# Patient Record
Sex: Female | Born: 1980 | Race: Black or African American | Hispanic: No | Marital: Single | Smoking: Never smoker
Health system: Southern US, Community
[De-identification: ages and names within clinical notes are randomized; demographics above are authoritative.]

## PROBLEM LIST (undated history)

## (undated) ENCOUNTER — Inpatient Hospital Stay (HOSPITAL_COMMUNITY): Payer: Self-pay

## (undated) DIAGNOSIS — K0889 Other specified disorders of teeth and supporting structures: Secondary | ICD-10-CM

## (undated) DIAGNOSIS — F329 Major depressive disorder, single episode, unspecified: Secondary | ICD-10-CM

## (undated) DIAGNOSIS — F32A Depression, unspecified: Secondary | ICD-10-CM

## (undated) HISTORY — PX: WISDOM TOOTH EXTRACTION: SHX21

---
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
## 2002-02-21 ENCOUNTER — Encounter: Payer: Self-pay | Admitting: Emergency Medicine

## 2002-02-21 ENCOUNTER — Emergency Department (HOSPITAL_COMMUNITY): Admission: EM | Admit: 2002-02-21 | Discharge: 2002-02-21 | Payer: Self-pay | Admitting: Emergency Medicine

## 2002-04-18 ENCOUNTER — Inpatient Hospital Stay (HOSPITAL_COMMUNITY): Admission: AD | Admit: 2002-04-18 | Discharge: 2002-04-18 | Payer: Self-pay | Admitting: *Deleted

## 2002-04-18 ENCOUNTER — Encounter: Payer: Self-pay | Admitting: Obstetrics and Gynecology

## 2002-04-20 ENCOUNTER — Encounter: Payer: Self-pay | Admitting: Obstetrics and Gynecology

## 2002-04-20 ENCOUNTER — Inpatient Hospital Stay (HOSPITAL_COMMUNITY): Admission: AD | Admit: 2002-04-20 | Discharge: 2002-04-20 | Payer: Self-pay | Admitting: Obstetrics and Gynecology

## 2002-05-22 ENCOUNTER — Ambulatory Visit (HOSPITAL_COMMUNITY): Admission: RE | Admit: 2002-05-22 | Discharge: 2002-05-22 | Payer: Self-pay | Admitting: Obstetrics and Gynecology

## 2002-07-13 ENCOUNTER — Inpatient Hospital Stay (HOSPITAL_COMMUNITY): Admission: AD | Admit: 2002-07-13 | Discharge: 2002-07-13 | Payer: Self-pay | Admitting: *Deleted

## 2002-08-21 ENCOUNTER — Inpatient Hospital Stay (HOSPITAL_COMMUNITY): Admission: AD | Admit: 2002-08-21 | Discharge: 2002-08-21 | Payer: Self-pay | Admitting: *Deleted

## 2002-08-23 ENCOUNTER — Inpatient Hospital Stay (HOSPITAL_COMMUNITY): Admission: AD | Admit: 2002-08-23 | Discharge: 2002-08-23 | Payer: Self-pay | Admitting: Obstetrics and Gynecology

## 2002-09-20 ENCOUNTER — Encounter: Payer: Self-pay | Admitting: Obstetrics and Gynecology

## 2002-09-20 ENCOUNTER — Inpatient Hospital Stay (HOSPITAL_COMMUNITY): Admission: AD | Admit: 2002-09-20 | Discharge: 2002-09-20 | Payer: Self-pay | Admitting: Obstetrics and Gynecology

## 2002-09-28 ENCOUNTER — Inpatient Hospital Stay (HOSPITAL_COMMUNITY): Admission: AD | Admit: 2002-09-28 | Discharge: 2002-09-28 | Payer: Self-pay | Admitting: *Deleted

## 2002-10-09 ENCOUNTER — Inpatient Hospital Stay (HOSPITAL_COMMUNITY): Admission: AD | Admit: 2002-10-09 | Discharge: 2002-10-09 | Payer: Self-pay | Admitting: *Deleted

## 2002-10-13 ENCOUNTER — Inpatient Hospital Stay (HOSPITAL_COMMUNITY): Admission: AD | Admit: 2002-10-13 | Discharge: 2002-10-15 | Payer: Self-pay | Admitting: Obstetrics and Gynecology

## 2003-11-12 ENCOUNTER — Emergency Department (HOSPITAL_COMMUNITY): Admission: EM | Admit: 2003-11-12 | Discharge: 2003-11-12 | Payer: Self-pay | Admitting: *Deleted

## 2004-04-15 ENCOUNTER — Emergency Department (HOSPITAL_COMMUNITY): Admission: EM | Admit: 2004-04-15 | Discharge: 2004-04-16 | Payer: Self-pay | Admitting: Emergency Medicine

## 2004-04-16 ENCOUNTER — Inpatient Hospital Stay (HOSPITAL_COMMUNITY): Admission: AD | Admit: 2004-04-16 | Discharge: 2004-04-16 | Payer: Self-pay | Admitting: Obstetrics and Gynecology

## 2004-04-19 ENCOUNTER — Inpatient Hospital Stay (HOSPITAL_COMMUNITY): Admission: AD | Admit: 2004-04-19 | Discharge: 2004-04-19 | Payer: Self-pay | Admitting: Obstetrics and Gynecology

## 2004-04-21 ENCOUNTER — Inpatient Hospital Stay (HOSPITAL_COMMUNITY): Admission: AD | Admit: 2004-04-21 | Discharge: 2004-04-21 | Payer: Self-pay | Admitting: Obstetrics and Gynecology

## 2004-05-05 ENCOUNTER — Inpatient Hospital Stay (HOSPITAL_COMMUNITY): Admission: AD | Admit: 2004-05-05 | Discharge: 2004-05-05 | Payer: Self-pay | Admitting: Obstetrics & Gynecology

## 2004-05-19 ENCOUNTER — Inpatient Hospital Stay (HOSPITAL_COMMUNITY): Admission: AD | Admit: 2004-05-19 | Discharge: 2004-05-19 | Payer: Self-pay | Admitting: Obstetrics and Gynecology

## 2004-06-01 ENCOUNTER — Inpatient Hospital Stay (HOSPITAL_COMMUNITY): Admission: AD | Admit: 2004-06-01 | Discharge: 2004-06-01 | Payer: Self-pay | Admitting: Obstetrics

## 2004-07-07 ENCOUNTER — Inpatient Hospital Stay (HOSPITAL_COMMUNITY): Admission: AD | Admit: 2004-07-07 | Discharge: 2004-07-07 | Payer: Self-pay | Admitting: Obstetrics and Gynecology

## 2004-08-06 ENCOUNTER — Ambulatory Visit (HOSPITAL_COMMUNITY): Admission: RE | Admit: 2004-08-06 | Discharge: 2004-08-06 | Payer: Self-pay | Admitting: Obstetrics & Gynecology

## 2004-08-29 ENCOUNTER — Ambulatory Visit (HOSPITAL_COMMUNITY): Admission: RE | Admit: 2004-08-29 | Discharge: 2004-08-29 | Payer: Self-pay | Admitting: Obstetrics & Gynecology

## 2004-09-03 ENCOUNTER — Inpatient Hospital Stay (HOSPITAL_COMMUNITY): Admission: AD | Admit: 2004-09-03 | Discharge: 2004-09-03 | Payer: Self-pay | Admitting: Obstetrics & Gynecology

## 2004-09-07 ENCOUNTER — Inpatient Hospital Stay (HOSPITAL_COMMUNITY): Admission: AD | Admit: 2004-09-07 | Discharge: 2004-09-07 | Payer: Self-pay | Admitting: Obstetrics

## 2004-09-11 ENCOUNTER — Emergency Department (HOSPITAL_COMMUNITY): Admission: EM | Admit: 2004-09-11 | Discharge: 2004-09-11 | Payer: Self-pay | Admitting: Family Medicine

## 2004-10-26 ENCOUNTER — Inpatient Hospital Stay (HOSPITAL_COMMUNITY): Admission: AD | Admit: 2004-10-26 | Discharge: 2004-10-26 | Payer: Self-pay | Admitting: Obstetrics & Gynecology

## 2004-11-07 ENCOUNTER — Inpatient Hospital Stay (HOSPITAL_COMMUNITY): Admission: AD | Admit: 2004-11-07 | Discharge: 2004-11-07 | Payer: Self-pay | Admitting: Obstetrics & Gynecology

## 2004-12-07 ENCOUNTER — Inpatient Hospital Stay (HOSPITAL_COMMUNITY): Admission: AD | Admit: 2004-12-07 | Discharge: 2004-12-07 | Payer: Self-pay | Admitting: Obstetrics

## 2004-12-09 ENCOUNTER — Inpatient Hospital Stay (HOSPITAL_COMMUNITY): Admission: AD | Admit: 2004-12-09 | Discharge: 2004-12-09 | Payer: Self-pay | Admitting: Obstetrics

## 2004-12-25 ENCOUNTER — Inpatient Hospital Stay (HOSPITAL_COMMUNITY): Admission: AD | Admit: 2004-12-25 | Discharge: 2004-12-28 | Payer: Self-pay | Admitting: Obstetrics

## 2005-01-22 ENCOUNTER — Emergency Department (HOSPITAL_COMMUNITY): Admission: EM | Admit: 2005-01-22 | Discharge: 2005-01-23 | Payer: Self-pay | Admitting: Emergency Medicine

## 2005-01-24 ENCOUNTER — Emergency Department (HOSPITAL_COMMUNITY): Admission: EM | Admit: 2005-01-24 | Discharge: 2005-01-24 | Payer: Self-pay | Admitting: Emergency Medicine

## 2005-01-25 ENCOUNTER — Emergency Department (HOSPITAL_COMMUNITY): Admission: EM | Admit: 2005-01-25 | Discharge: 2005-01-25 | Payer: Self-pay | Admitting: Emergency Medicine

## 2005-02-10 ENCOUNTER — Emergency Department (HOSPITAL_COMMUNITY): Admission: EM | Admit: 2005-02-10 | Discharge: 2005-02-10 | Payer: Self-pay | Admitting: Family Medicine

## 2005-10-11 ENCOUNTER — Emergency Department (HOSPITAL_COMMUNITY): Admission: EM | Admit: 2005-10-11 | Discharge: 2005-10-11 | Payer: Self-pay | Admitting: Emergency Medicine

## 2006-03-17 ENCOUNTER — Ambulatory Visit (HOSPITAL_COMMUNITY): Admission: RE | Admit: 2006-03-17 | Discharge: 2006-03-17 | Payer: Self-pay | Admitting: Obstetrics

## 2006-03-23 ENCOUNTER — Inpatient Hospital Stay (HOSPITAL_COMMUNITY): Admission: AD | Admit: 2006-03-23 | Discharge: 2006-03-24 | Payer: Self-pay | Admitting: Obstetrics

## 2006-03-30 ENCOUNTER — Inpatient Hospital Stay (HOSPITAL_COMMUNITY): Admission: AD | Admit: 2006-03-30 | Discharge: 2006-03-31 | Payer: Self-pay | Admitting: Obstetrics

## 2006-05-05 ENCOUNTER — Inpatient Hospital Stay (HOSPITAL_COMMUNITY): Admission: AD | Admit: 2006-05-05 | Discharge: 2006-05-07 | Payer: Self-pay | Admitting: Obstetrics & Gynecology

## 2006-06-04 ENCOUNTER — Emergency Department (HOSPITAL_COMMUNITY): Admission: EM | Admit: 2006-06-04 | Discharge: 2006-06-04 | Payer: Self-pay | Admitting: Emergency Medicine

## 2006-06-04 ENCOUNTER — Inpatient Hospital Stay (HOSPITAL_COMMUNITY): Admission: AD | Admit: 2006-06-04 | Discharge: 2006-06-05 | Payer: Self-pay | Admitting: Obstetrics

## 2006-07-24 ENCOUNTER — Inpatient Hospital Stay (HOSPITAL_COMMUNITY): Admission: AD | Admit: 2006-07-24 | Discharge: 2006-07-24 | Payer: Self-pay | Admitting: Obstetrics & Gynecology

## 2006-07-26 ENCOUNTER — Inpatient Hospital Stay (HOSPITAL_COMMUNITY): Admission: AD | Admit: 2006-07-26 | Discharge: 2006-07-27 | Payer: Self-pay | Admitting: Obstetrics

## 2006-08-07 ENCOUNTER — Inpatient Hospital Stay (HOSPITAL_COMMUNITY): Admission: AD | Admit: 2006-08-07 | Discharge: 2006-08-09 | Payer: Self-pay | Admitting: Obstetrics

## 2006-08-12 ENCOUNTER — Inpatient Hospital Stay (HOSPITAL_COMMUNITY): Admission: AD | Admit: 2006-08-12 | Discharge: 2006-08-13 | Payer: Self-pay | Admitting: Obstetrics & Gynecology

## 2006-09-04 ENCOUNTER — Emergency Department (HOSPITAL_COMMUNITY): Admission: EM | Admit: 2006-09-04 | Discharge: 2006-09-04 | Payer: Self-pay | Admitting: Emergency Medicine

## 2006-09-15 ENCOUNTER — Inpatient Hospital Stay (HOSPITAL_COMMUNITY): Admission: AD | Admit: 2006-09-15 | Discharge: 2006-09-16 | Payer: Self-pay | Admitting: Obstetrics

## 2007-03-27 ENCOUNTER — Emergency Department (HOSPITAL_COMMUNITY): Admission: EM | Admit: 2007-03-27 | Discharge: 2007-03-28 | Payer: Self-pay | Admitting: Emergency Medicine

## 2007-03-30 ENCOUNTER — Other Ambulatory Visit: Admission: RE | Admit: 2007-03-30 | Discharge: 2007-03-30 | Payer: Self-pay | Admitting: Obstetrics and Gynecology

## 2007-04-13 ENCOUNTER — Inpatient Hospital Stay (HOSPITAL_COMMUNITY): Admission: AD | Admit: 2007-04-13 | Discharge: 2007-04-14 | Payer: Self-pay | Admitting: Obstetrics & Gynecology

## 2007-05-10 ENCOUNTER — Inpatient Hospital Stay (HOSPITAL_COMMUNITY): Admission: AD | Admit: 2007-05-10 | Discharge: 2007-05-11 | Payer: Self-pay | Admitting: Obstetrics & Gynecology

## 2007-05-17 ENCOUNTER — Ambulatory Visit (HOSPITAL_COMMUNITY): Admission: RE | Admit: 2007-05-17 | Discharge: 2007-05-17 | Payer: Self-pay | Admitting: Obstetrics

## 2007-05-17 ENCOUNTER — Encounter: Payer: Self-pay | Admitting: Obstetrics

## 2007-05-17 HISTORY — PX: DILATION AND EVACUATION: SHX1459

## 2007-05-30 ENCOUNTER — Emergency Department (HOSPITAL_COMMUNITY): Admission: EM | Admit: 2007-05-30 | Discharge: 2007-05-30 | Payer: Self-pay | Admitting: Emergency Medicine

## 2007-08-31 ENCOUNTER — Emergency Department (HOSPITAL_COMMUNITY): Admission: EM | Admit: 2007-08-31 | Discharge: 2007-08-31 | Payer: Self-pay | Admitting: Emergency Medicine

## 2007-09-01 ENCOUNTER — Emergency Department (HOSPITAL_COMMUNITY): Admission: EM | Admit: 2007-09-01 | Discharge: 2007-09-01 | Payer: Self-pay | Admitting: General Surgery

## 2007-10-15 ENCOUNTER — Emergency Department (HOSPITAL_COMMUNITY): Admission: EM | Admit: 2007-10-15 | Discharge: 2007-10-16 | Payer: Self-pay | Admitting: Emergency Medicine

## 2007-11-28 ENCOUNTER — Emergency Department (HOSPITAL_COMMUNITY): Admission: EM | Admit: 2007-11-28 | Discharge: 2007-11-28 | Payer: Self-pay | Admitting: Emergency Medicine

## 2008-02-01 ENCOUNTER — Emergency Department (HOSPITAL_COMMUNITY): Admission: EM | Admit: 2008-02-01 | Discharge: 2008-02-02 | Payer: Self-pay | Admitting: Emergency Medicine

## 2008-05-09 ENCOUNTER — Emergency Department (HOSPITAL_COMMUNITY): Admission: EM | Admit: 2008-05-09 | Discharge: 2008-05-09 | Payer: Self-pay | Admitting: Emergency Medicine

## 2008-07-17 ENCOUNTER — Emergency Department (HOSPITAL_COMMUNITY): Admission: EM | Admit: 2008-07-17 | Discharge: 2008-07-18 | Payer: Self-pay | Admitting: Emergency Medicine

## 2008-08-01 ENCOUNTER — Emergency Department (HOSPITAL_COMMUNITY): Admission: EM | Admit: 2008-08-01 | Discharge: 2008-08-01 | Payer: Self-pay | Admitting: Emergency Medicine

## 2008-08-16 ENCOUNTER — Emergency Department (HOSPITAL_COMMUNITY): Admission: EM | Admit: 2008-08-16 | Discharge: 2008-08-16 | Payer: Self-pay | Admitting: Emergency Medicine

## 2008-08-20 ENCOUNTER — Inpatient Hospital Stay (HOSPITAL_COMMUNITY): Admission: AD | Admit: 2008-08-20 | Discharge: 2008-08-20 | Payer: Self-pay | Admitting: Obstetrics & Gynecology

## 2008-09-02 ENCOUNTER — Inpatient Hospital Stay (HOSPITAL_COMMUNITY): Admission: AD | Admit: 2008-09-02 | Discharge: 2008-09-02 | Payer: Self-pay | Admitting: Obstetrics & Gynecology

## 2008-09-07 ENCOUNTER — Inpatient Hospital Stay (HOSPITAL_COMMUNITY): Admission: AD | Admit: 2008-09-07 | Discharge: 2008-09-07 | Payer: Self-pay | Admitting: Obstetrics and Gynecology

## 2008-09-09 ENCOUNTER — Emergency Department (HOSPITAL_COMMUNITY): Admission: EM | Admit: 2008-09-09 | Discharge: 2008-09-09 | Payer: Self-pay | Admitting: Emergency Medicine

## 2008-09-12 ENCOUNTER — Emergency Department (HOSPITAL_COMMUNITY): Admission: EM | Admit: 2008-09-12 | Discharge: 2008-09-12 | Payer: Self-pay | Admitting: Emergency Medicine

## 2008-09-15 ENCOUNTER — Inpatient Hospital Stay (HOSPITAL_COMMUNITY): Admission: AD | Admit: 2008-09-15 | Discharge: 2008-09-15 | Payer: Self-pay | Admitting: Obstetrics & Gynecology

## 2008-09-22 ENCOUNTER — Emergency Department (HOSPITAL_COMMUNITY): Admission: EM | Admit: 2008-09-22 | Discharge: 2008-09-22 | Payer: Self-pay | Admitting: Family Medicine

## 2008-09-27 ENCOUNTER — Emergency Department (HOSPITAL_COMMUNITY): Admission: EM | Admit: 2008-09-27 | Discharge: 2008-09-27 | Payer: Self-pay | Admitting: Emergency Medicine

## 2008-09-30 ENCOUNTER — Inpatient Hospital Stay (HOSPITAL_COMMUNITY): Admission: AD | Admit: 2008-09-30 | Discharge: 2008-09-30 | Payer: Self-pay | Admitting: Obstetrics and Gynecology

## 2008-10-04 ENCOUNTER — Inpatient Hospital Stay (HOSPITAL_COMMUNITY): Admission: AD | Admit: 2008-10-04 | Discharge: 2008-10-04 | Payer: Self-pay | Admitting: Obstetrics and Gynecology

## 2008-10-13 ENCOUNTER — Inpatient Hospital Stay (HOSPITAL_COMMUNITY): Admission: AD | Admit: 2008-10-13 | Discharge: 2008-10-13 | Payer: Self-pay | Admitting: Obstetrics and Gynecology

## 2008-10-13 ENCOUNTER — Ambulatory Visit: Payer: Self-pay | Admitting: Obstetrics and Gynecology

## 2008-10-21 ENCOUNTER — Emergency Department (HOSPITAL_COMMUNITY): Admission: EM | Admit: 2008-10-21 | Discharge: 2008-10-21 | Payer: Self-pay | Admitting: Emergency Medicine

## 2008-10-22 ENCOUNTER — Ambulatory Visit (HOSPITAL_COMMUNITY): Admission: RE | Admit: 2008-10-22 | Discharge: 2008-10-22 | Payer: Self-pay | Admitting: Obstetrics and Gynecology

## 2008-10-27 ENCOUNTER — Ambulatory Visit: Payer: Self-pay | Admitting: Advanced Practice Midwife

## 2008-10-27 ENCOUNTER — Inpatient Hospital Stay (HOSPITAL_COMMUNITY): Admission: AD | Admit: 2008-10-27 | Discharge: 2008-10-28 | Payer: Self-pay | Admitting: Obstetrics and Gynecology

## 2008-11-02 ENCOUNTER — Inpatient Hospital Stay (HOSPITAL_COMMUNITY): Admission: AD | Admit: 2008-11-02 | Discharge: 2008-11-02 | Payer: Self-pay | Admitting: Obstetrics and Gynecology

## 2008-11-11 ENCOUNTER — Inpatient Hospital Stay (HOSPITAL_COMMUNITY): Admission: AD | Admit: 2008-11-11 | Discharge: 2008-11-12 | Payer: Self-pay | Admitting: Obstetrics and Gynecology

## 2008-11-15 ENCOUNTER — Ambulatory Visit (HOSPITAL_COMMUNITY): Admission: RE | Admit: 2008-11-15 | Discharge: 2008-11-15 | Payer: Self-pay | Admitting: Obstetrics and Gynecology

## 2008-12-13 ENCOUNTER — Ambulatory Visit (HOSPITAL_COMMUNITY): Admission: RE | Admit: 2008-12-13 | Discharge: 2008-12-13 | Payer: Self-pay | Admitting: Obstetrics and Gynecology

## 2008-12-19 ENCOUNTER — Observation Stay (HOSPITAL_COMMUNITY): Admission: AD | Admit: 2008-12-19 | Discharge: 2008-12-20 | Payer: Self-pay | Admitting: Obstetrics and Gynecology

## 2009-01-03 ENCOUNTER — Ambulatory Visit (HOSPITAL_COMMUNITY): Admission: RE | Admit: 2009-01-03 | Discharge: 2009-01-03 | Payer: Self-pay | Admitting: Obstetrics and Gynecology

## 2009-01-07 ENCOUNTER — Ambulatory Visit (HOSPITAL_COMMUNITY): Admission: RE | Admit: 2009-01-07 | Discharge: 2009-01-07 | Payer: Self-pay | Admitting: Obstetrics and Gynecology

## 2009-01-11 ENCOUNTER — Ambulatory Visit (HOSPITAL_COMMUNITY): Admission: RE | Admit: 2009-01-11 | Discharge: 2009-01-11 | Payer: Self-pay | Admitting: Obstetrics and Gynecology

## 2009-01-14 ENCOUNTER — Ambulatory Visit (HOSPITAL_COMMUNITY): Admission: RE | Admit: 2009-01-14 | Discharge: 2009-01-14 | Payer: Self-pay | Admitting: Obstetrics and Gynecology

## 2009-01-17 ENCOUNTER — Ambulatory Visit (HOSPITAL_COMMUNITY): Admission: RE | Admit: 2009-01-17 | Discharge: 2009-01-17 | Payer: Self-pay | Admitting: Obstetrics and Gynecology

## 2009-01-20 ENCOUNTER — Ambulatory Visit: Payer: Self-pay | Admitting: Obstetrics and Gynecology

## 2009-01-20 ENCOUNTER — Inpatient Hospital Stay (HOSPITAL_COMMUNITY): Admission: AD | Admit: 2009-01-20 | Discharge: 2009-01-20 | Payer: Self-pay | Admitting: Obstetrics and Gynecology

## 2009-01-21 ENCOUNTER — Ambulatory Visit (HOSPITAL_COMMUNITY): Admission: RE | Admit: 2009-01-21 | Discharge: 2009-01-21 | Payer: Self-pay | Admitting: Obstetrics and Gynecology

## 2009-01-25 ENCOUNTER — Ambulatory Visit (HOSPITAL_COMMUNITY): Admission: RE | Admit: 2009-01-25 | Discharge: 2009-01-25 | Payer: Self-pay | Admitting: Obstetrics and Gynecology

## 2009-01-26 ENCOUNTER — Emergency Department (HOSPITAL_BASED_OUTPATIENT_CLINIC_OR_DEPARTMENT_OTHER): Admission: EM | Admit: 2009-01-26 | Discharge: 2009-01-26 | Payer: Self-pay | Admitting: Emergency Medicine

## 2009-01-29 ENCOUNTER — Inpatient Hospital Stay (HOSPITAL_COMMUNITY): Admission: AD | Admit: 2009-01-29 | Discharge: 2009-01-29 | Payer: Self-pay | Admitting: Obstetrics and Gynecology

## 2009-02-18 ENCOUNTER — Encounter: Payer: Self-pay | Admitting: Obstetrics and Gynecology

## 2009-02-18 ENCOUNTER — Inpatient Hospital Stay (HOSPITAL_COMMUNITY): Admission: AD | Admit: 2009-02-18 | Discharge: 2009-02-21 | Payer: Self-pay | Admitting: Obstetrics and Gynecology

## 2009-02-19 ENCOUNTER — Encounter: Payer: Self-pay | Admitting: Obstetrics and Gynecology

## 2009-08-18 ENCOUNTER — Emergency Department (HOSPITAL_COMMUNITY): Admission: EM | Admit: 2009-08-18 | Discharge: 2009-08-19 | Payer: Self-pay | Admitting: Emergency Medicine

## 2009-08-27 ENCOUNTER — Inpatient Hospital Stay (HOSPITAL_COMMUNITY): Admission: AD | Admit: 2009-08-27 | Discharge: 2009-08-28 | Payer: Self-pay | Admitting: Obstetrics and Gynecology

## 2009-09-03 ENCOUNTER — Inpatient Hospital Stay (HOSPITAL_COMMUNITY): Admission: AD | Admit: 2009-09-03 | Discharge: 2009-09-03 | Payer: Self-pay | Admitting: Obstetrics and Gynecology

## 2009-09-11 ENCOUNTER — Inpatient Hospital Stay (HOSPITAL_COMMUNITY): Admission: AD | Admit: 2009-09-11 | Discharge: 2009-09-12 | Payer: Self-pay | Admitting: Obstetrics and Gynecology

## 2009-09-20 ENCOUNTER — Emergency Department (HOSPITAL_COMMUNITY): Admission: EM | Admit: 2009-09-20 | Discharge: 2009-09-20 | Payer: Self-pay | Admitting: Emergency Medicine

## 2009-09-25 ENCOUNTER — Emergency Department (HOSPITAL_COMMUNITY): Admission: EM | Admit: 2009-09-25 | Discharge: 2009-09-25 | Payer: Self-pay | Admitting: Family Medicine

## 2009-10-08 ENCOUNTER — Inpatient Hospital Stay (HOSPITAL_COMMUNITY): Admission: AD | Admit: 2009-10-08 | Discharge: 2009-10-08 | Payer: Self-pay | Admitting: Family Medicine

## 2009-11-01 ENCOUNTER — Inpatient Hospital Stay (HOSPITAL_COMMUNITY): Admission: AD | Admit: 2009-11-01 | Discharge: 2009-11-01 | Payer: Self-pay | Admitting: Obstetrics and Gynecology

## 2009-11-14 ENCOUNTER — Ambulatory Visit (HOSPITAL_COMMUNITY): Admission: RE | Admit: 2009-11-14 | Discharge: 2009-11-14 | Payer: Self-pay | Admitting: Obstetrics and Gynecology

## 2009-11-21 ENCOUNTER — Ambulatory Visit (HOSPITAL_COMMUNITY): Admission: RE | Admit: 2009-11-21 | Discharge: 2009-11-21 | Payer: Self-pay | Admitting: Obstetrics and Gynecology

## 2009-12-14 ENCOUNTER — Inpatient Hospital Stay (HOSPITAL_COMMUNITY): Admission: AD | Admit: 2009-12-14 | Discharge: 2009-12-15 | Payer: Self-pay | Admitting: Obstetrics and Gynecology

## 2009-12-17 ENCOUNTER — Emergency Department (HOSPITAL_BASED_OUTPATIENT_CLINIC_OR_DEPARTMENT_OTHER): Admission: EM | Admit: 2009-12-17 | Discharge: 2009-12-17 | Payer: Self-pay | Admitting: Emergency Medicine

## 2009-12-25 ENCOUNTER — Ambulatory Visit (HOSPITAL_COMMUNITY): Admission: RE | Admit: 2009-12-25 | Discharge: 2009-12-25 | Payer: Self-pay | Admitting: Obstetrics and Gynecology

## 2010-01-15 ENCOUNTER — Inpatient Hospital Stay (HOSPITAL_COMMUNITY): Admission: AD | Admit: 2010-01-15 | Discharge: 2010-01-15 | Payer: Self-pay | Admitting: Obstetrics and Gynecology

## 2010-01-15 ENCOUNTER — Ambulatory Visit: Payer: Self-pay | Admitting: Advanced Practice Midwife

## 2010-01-15 ENCOUNTER — Ambulatory Visit (HOSPITAL_COMMUNITY): Admission: RE | Admit: 2010-01-15 | Discharge: 2010-01-15 | Payer: Self-pay | Admitting: Obstetrics and Gynecology

## 2010-01-18 ENCOUNTER — Emergency Department (HOSPITAL_COMMUNITY): Admission: EM | Admit: 2010-01-18 | Discharge: 2010-01-18 | Payer: Self-pay | Admitting: Family Medicine

## 2010-01-23 ENCOUNTER — Ambulatory Visit (HOSPITAL_COMMUNITY): Admission: RE | Admit: 2010-01-23 | Discharge: 2010-01-23 | Payer: Self-pay | Admitting: Obstetrics and Gynecology

## 2010-01-31 ENCOUNTER — Ambulatory Visit (HOSPITAL_COMMUNITY): Admission: RE | Admit: 2010-01-31 | Discharge: 2010-01-31 | Payer: Self-pay | Admitting: Obstetrics and Gynecology

## 2010-02-06 ENCOUNTER — Ambulatory Visit (HOSPITAL_COMMUNITY): Admission: RE | Admit: 2010-02-06 | Discharge: 2010-02-06 | Payer: Self-pay | Admitting: Obstetrics and Gynecology

## 2010-02-14 ENCOUNTER — Ambulatory Visit (HOSPITAL_COMMUNITY): Admission: RE | Admit: 2010-02-14 | Discharge: 2010-02-14 | Payer: Self-pay | Admitting: Obstetrics and Gynecology

## 2010-02-18 ENCOUNTER — Ambulatory Visit: Payer: Self-pay | Admitting: Gynecology

## 2010-02-18 ENCOUNTER — Inpatient Hospital Stay (HOSPITAL_COMMUNITY): Admission: AD | Admit: 2010-02-18 | Discharge: 2010-02-25 | Payer: Self-pay | Admitting: Obstetrics and Gynecology

## 2010-02-28 ENCOUNTER — Ambulatory Visit (HOSPITAL_COMMUNITY): Admission: RE | Admit: 2010-02-28 | Discharge: 2010-02-28 | Payer: Self-pay | Admitting: Obstetrics and Gynecology

## 2010-03-07 ENCOUNTER — Ambulatory Visit (HOSPITAL_COMMUNITY): Admission: RE | Admit: 2010-03-07 | Discharge: 2010-03-07 | Payer: Self-pay | Admitting: Obstetrics and Gynecology

## 2010-03-09 ENCOUNTER — Ambulatory Visit: Payer: Self-pay | Admitting: Family

## 2010-03-09 ENCOUNTER — Inpatient Hospital Stay (HOSPITAL_COMMUNITY): Admission: AD | Admit: 2010-03-09 | Discharge: 2010-03-10 | Payer: Self-pay | Admitting: Obstetrics and Gynecology

## 2010-03-11 ENCOUNTER — Ambulatory Visit (HOSPITAL_COMMUNITY): Admission: RE | Admit: 2010-03-11 | Discharge: 2010-03-11 | Payer: Self-pay | Admitting: Obstetrics and Gynecology

## 2010-03-14 ENCOUNTER — Ambulatory Visit (HOSPITAL_COMMUNITY): Admission: RE | Admit: 2010-03-14 | Discharge: 2010-03-14 | Payer: Self-pay | Admitting: Obstetrics and Gynecology

## 2010-03-19 ENCOUNTER — Encounter: Payer: Self-pay | Admitting: Obstetrics and Gynecology

## 2010-03-19 ENCOUNTER — Inpatient Hospital Stay (HOSPITAL_COMMUNITY): Admission: RE | Admit: 2010-03-19 | Discharge: 2010-03-21 | Payer: Self-pay | Admitting: Obstetrics and Gynecology

## 2010-04-02 ENCOUNTER — Inpatient Hospital Stay (HOSPITAL_COMMUNITY): Admission: AD | Admit: 2010-04-02 | Discharge: 2010-04-04 | Payer: Self-pay | Admitting: Obstetrics and Gynecology

## 2010-08-23 ENCOUNTER — Encounter: Payer: Self-pay | Admitting: Obstetrics & Gynecology

## 2010-08-24 ENCOUNTER — Encounter: Payer: Self-pay | Admitting: Obstetrics and Gynecology

## 2010-08-25 ENCOUNTER — Encounter: Payer: Self-pay | Admitting: Obstetrics and Gynecology

## 2010-10-16 LAB — COMPREHENSIVE METABOLIC PANEL
ALT: 11 U/L (ref 0–35)
ALT: 13 U/L (ref 0–35)
ALT: 28 U/L (ref 0–35)
AST: 24 U/L (ref 0–37)
Alkaline Phosphatase: 107 U/L (ref 39–117)
Alkaline Phosphatase: 88 U/L (ref 39–117)
CO2: 21 mEq/L (ref 19–32)
CO2: 25 mEq/L (ref 19–32)
CO2: 26 mEq/L (ref 19–32)
Calcium: 8.3 mg/dL — ABNORMAL LOW (ref 8.4–10.5)
Chloride: 106 mEq/L (ref 96–112)
GFR calc Af Amer: >60 mL/min (ref >60)
GFR calc non Af Amer: >60 mL/min (ref >60)
GFR calc non Af Amer: >60 mL/min (ref >60)
GFR calc non Af Amer: >60 mL/min (ref >60)
Glucose, Bld: 68 mg/dL — ABNORMAL LOW (ref 70–99)
Glucose, Bld: 85 mg/dL (ref 70–99)
Potassium: 3.4 mEq/L — ABNORMAL LOW (ref 3.5–5.1)
Potassium: 3.7 mEq/L (ref 3.5–5.1)
Potassium: 4 mEq/L (ref 3.5–5.1)
Sodium: 134 mEq/L — ABNORMAL LOW (ref 135–145)
Sodium: 135 mEq/L (ref 135–145)
Sodium: 139 mEq/L (ref 135–145)
Total Bilirubin: 0.7 mg/dL (ref 0.3–1.2)
Total Protein: 6 g/dL (ref 6.0–8.3)

## 2010-10-16 LAB — DIFFERENTIAL
Basophils Absolute: 0 10*3/uL (ref 0.0–0.1)
Lymphs Abs: 1.5 10*3/uL (ref 0.7–4.0)
Monocytes Relative: 8 % (ref 3–12)
Neutro Abs: 3.6 10*3/uL (ref 1.7–7.7)

## 2010-10-16 LAB — CROSSMATCH: Antibody Screen: NEGATIVE

## 2010-10-16 LAB — URINALYSIS, DIPSTICK ONLY
Glucose, UA: NEGATIVE mg/dL
Ketones, ur: 15 mg/dL — AB
Leukocytes, UA: NEGATIVE
pH: 6 (ref 5.0–8.0)

## 2010-10-16 LAB — HEMOGLOBIN AND HEMATOCRIT, BLOOD
HCT: 29.8 % — ABNORMAL LOW (ref 36.0–46.0)
Hemoglobin: 8.9 g/dL — ABNORMAL LOW (ref 12.0–15.0)
Hemoglobin: 9.6 g/dL — ABNORMAL LOW (ref 12.0–15.0)

## 2010-10-16 LAB — CBC
HCT: 22.6 % — ABNORMAL LOW (ref 36.0–46.0)
Hemoglobin: 7 g/dL — ABNORMAL LOW (ref 12.0–15.0)
Hemoglobin: 7.4 g/dL — ABNORMAL LOW (ref 12.0–15.0)
Hemoglobin: 9.3 g/dL — ABNORMAL LOW (ref 12.0–15.0)
MCH: 26.2 pg (ref 26.0–34.0)
MCHC: 32.6 g/dL (ref 30.0–36.0)
MCV: 78.7 fL (ref 78.0–100.0)
MCV: 79.6 fL (ref 78.0–100.0)
Platelets: 130 10*3/uL — ABNORMAL LOW (ref 150–400)
RBC: 2.85 MIL/uL — ABNORMAL LOW (ref 3.87–5.11)
RBC: 3.54 MIL/uL — ABNORMAL LOW (ref 3.87–5.11)
RDW: 16.7 % — ABNORMAL HIGH (ref 11.5–15.5)
WBC: 10.4 10*3/uL (ref 4.0–10.5)

## 2010-10-16 LAB — CREATININE, SERUM
Creatinine, Ser: 0.99 mg/dL (ref 0.4–1.2)
GFR calc Af Amer: >60 mL/min (ref >60)

## 2010-10-16 LAB — BASIC METABOLIC PANEL
CO2: 26 mEq/L (ref 19–32)
Chloride: 104 mEq/L (ref 96–112)
GFR calc Af Amer: >60 mL/min (ref >60)
Sodium: 139 mEq/L (ref 135–145)

## 2010-10-16 LAB — LACTATE DEHYDROGENASE: LDH: 159 U/L (ref 94–250)

## 2010-10-17 LAB — URINALYSIS, ROUTINE W REFLEX MICROSCOPIC
Ketones, ur: NEGATIVE mg/dL
Nitrite: NEGATIVE
Protein, ur: 30 mg/dL — AB
Urobilinogen, UA: 4 mg/dL — ABNORMAL HIGH (ref 0.0–1.0)
pH: 6 (ref 5.0–8.0)

## 2010-10-17 LAB — CBC
HCT: 30.9 % — ABNORMAL LOW (ref 36.0–46.0)
Hemoglobin: 9.5 g/dL — ABNORMAL LOW (ref 12.0–15.0)
MCH: 24.6 pg — ABNORMAL LOW (ref 26.0–34.0)
MCHC: 30.8 g/dL (ref 30.0–36.0)
MCV: 79.8 fL (ref 78.0–100.0)

## 2010-10-18 LAB — URINALYSIS, DIPSTICK ONLY
Bilirubin Urine: NEGATIVE
Bilirubin Urine: NEGATIVE
Hgb urine dipstick: NEGATIVE
Ketones, ur: NEGATIVE mg/dL
Nitrite: NEGATIVE
Nitrite: NEGATIVE
Protein, ur: NEGATIVE mg/dL
Specific Gravity, Urine: <1.005 — ABNORMAL LOW (ref 1.005–1.030)
Urobilinogen, UA: 0.2 mg/dL (ref 0.0–1.0)
pH: 7 (ref 5.0–8.0)

## 2010-10-18 LAB — CBC
HCT: 24.5 % — ABNORMAL LOW (ref 36.0–46.0)
HCT: 25.9 % — ABNORMAL LOW (ref 36.0–46.0)
HCT: 26.5 % — ABNORMAL LOW (ref 36.0–46.0)
HCT: 27.4 % — ABNORMAL LOW (ref 36.0–46.0)
Hemoglobin: 8.1 g/dL — ABNORMAL LOW (ref 12.0–15.0)
Hemoglobin: 8.6 g/dL — ABNORMAL LOW (ref 12.0–15.0)
Hemoglobin: 8.7 g/dL — ABNORMAL LOW (ref 12.0–15.0)
Hemoglobin: 9 g/dL — ABNORMAL LOW (ref 12.0–15.0)
MCH: 26 pg (ref 26.0–34.0)
MCH: 26.1 pg (ref 26.0–34.0)
MCH: 26.2 pg (ref 26.0–34.0)
MCH: 26.2 pg (ref 26.0–34.0)
MCH: 26.4 pg (ref 26.0–34.0)
MCH: 26.6 pg (ref 26.0–34.0)
MCH: 26.7 pg (ref 26.0–34.0)
MCHC: 32.5 g/dL (ref 30.0–36.0)
MCHC: 32.5 g/dL (ref 30.0–36.0)
MCHC: 32.8 g/dL (ref 30.0–36.0)
MCHC: 33.4 g/dL (ref 30.0–36.0)
MCV: 79.1 fL (ref 78.0–100.0)
MCV: 79.1 fL (ref 78.0–100.0)
MCV: 80.5 fL (ref 78.0–100.0)
Platelets: 110 10*3/uL — ABNORMAL LOW (ref 150–400)
Platelets: 122 10*3/uL — ABNORMAL LOW (ref 150–400)
Platelets: 158 10*3/uL (ref 150–400)
Platelets: 163 10*3/uL (ref 150–400)
Platelets: 166 10*3/uL (ref 150–400)
RBC: 3.04 MIL/uL — ABNORMAL LOW (ref 3.87–5.11)
RBC: 3.41 MIL/uL — ABNORMAL LOW (ref 3.87–5.11)
RBC: 3.53 MIL/uL — ABNORMAL LOW (ref 3.87–5.11)
RBC: 3.75 MIL/uL — ABNORMAL LOW (ref 3.87–5.11)
RDW: 15.7 % — ABNORMAL HIGH (ref 11.5–15.5)
RDW: 15.8 % — ABNORMAL HIGH (ref 11.5–15.5)
RDW: 15.8 % — ABNORMAL HIGH (ref 11.5–15.5)
RDW: 15.9 % — ABNORMAL HIGH (ref 11.5–15.5)
RDW: 15.9 % — ABNORMAL HIGH (ref 11.5–15.5)
WBC: 7.2 10*3/uL (ref 4.0–10.5)
WBC: 8.1 10*3/uL (ref 4.0–10.5)
WBC: 8.3 10*3/uL (ref 4.0–10.5)
WBC: 8.7 10*3/uL (ref 4.0–10.5)
WBC: 9.4 10*3/uL (ref 4.0–10.5)

## 2010-10-18 LAB — WET PREP, GENITAL
Trich, Wet Prep: NONE SEEN
Yeast Wet Prep HPF POC: NONE SEEN

## 2010-10-18 LAB — CREATININE CLEARANCE, URINE, 24 HOUR
Creatinine, 24H Ur: 2499 mg/d — ABNORMAL HIGH (ref 700–1800)
Urine Total Volume-CRCL: 3400 mL

## 2010-10-18 LAB — COMPREHENSIVE METABOLIC PANEL
ALT: 12 U/L (ref 0–35)
AST: 19 U/L (ref 0–37)
AST: 25 U/L (ref 0–37)
Albumin: 2.9 g/dL — ABNORMAL LOW (ref 3.5–5.2)
Alkaline Phosphatase: 73 U/L (ref 39–117)
BUN: 3 mg/dL — ABNORMAL LOW (ref 6–23)
CO2: 27 mEq/L (ref 19–32)
Calcium: 8.9 mg/dL (ref 8.4–10.5)
Chloride: 104 mEq/L (ref 96–112)
Chloride: 105 mEq/L (ref 96–112)
Creatinine, Ser: 0.61 mg/dL (ref 0.4–1.2)
GFR calc Af Amer: >60 mL/min (ref >60)
GFR calc Af Amer: >60 mL/min (ref >60)
GFR calc non Af Amer: >60 mL/min (ref >60)
Potassium: 3.5 mEq/L (ref 3.5–5.1)
Sodium: 137 mEq/L (ref 135–145)
Total Bilirubin: 0.9 mg/dL (ref 0.3–1.2)
Total Protein: 7 g/dL (ref 6.0–8.3)

## 2010-10-18 LAB — PROTEIN, URINE, 24 HOUR
Collection Interval-UPROT: 24 hours
Protein, Urine: 5 mg/dL

## 2010-10-18 LAB — FIBRINOGEN: Fibrinogen: 460 mg/dL (ref 204–475)

## 2010-10-18 LAB — PROTIME-INR
INR: 1 (ref 0.00–1.49)
INR: 1 (ref 0.00–1.49)
INR: 1.05 (ref 0.00–1.49)
Prothrombin Time: 13.1 seconds (ref 11.6–15.2)
Prothrombin Time: 14 seconds (ref 11.6–15.2)

## 2010-10-18 LAB — APTT
aPTT: 26 seconds (ref 24–37)
aPTT: 28 seconds (ref 24–37)
aPTT: 28 seconds (ref 24–37)

## 2010-10-18 LAB — MRSA PCR SCREENING: MRSA by PCR: NEGATIVE

## 2010-10-19 LAB — URINALYSIS, ROUTINE W REFLEX MICROSCOPIC
Hgb urine dipstick: NEGATIVE
Nitrite: NEGATIVE
Protein, ur: NEGATIVE mg/dL
Specific Gravity, Urine: 1.025 (ref 1.005–1.030)
Urobilinogen, UA: 4 mg/dL — ABNORMAL HIGH (ref 0.0–1.0)
Urobilinogen, UA: 1 mg/dL (ref 0.0–1.0)

## 2010-10-19 LAB — POCT URINALYSIS DIP (DEVICE)
Glucose, UA: NEGATIVE mg/dL
Ketones, ur: NEGATIVE mg/dL
Specific Gravity, Urine: 1.01 (ref 1.005–1.030)

## 2010-10-19 LAB — BASIC METABOLIC PANEL
CO2: 24 mEq/L (ref 19–32)
Calcium: 8.6 mg/dL (ref 8.4–10.5)
GFR calc Af Amer: >60 mL/min (ref >60)
GFR calc non Af Amer: >60 mL/min (ref >60)
Sodium: 131 mEq/L — ABNORMAL LOW (ref 135–145)

## 2010-10-19 LAB — POCT PREGNANCY, URINE
Preg Test, Ur: POSITIVE
Preg Test, Ur: POSITIVE

## 2010-10-19 LAB — GC/CHLAMYDIA PROBE AMP, GENITAL
GC Probe Amp, Genital: NEGATIVE
GC Probe Amp, Genital: NEGATIVE

## 2010-10-19 LAB — AMNISURE RUPTURE OF MEMBRANE (ROM) NOT AT ARMC: Amnisure ROM: NEGATIVE

## 2010-10-19 LAB — WET PREP, GENITAL
Trich, Wet Prep: NONE SEEN
Trich, Wet Prep: NONE SEEN
Yeast Wet Prep HPF POC: NONE SEEN

## 2010-10-19 LAB — HCG, QUANTITATIVE, PREGNANCY: hCG, Beta Chain, Quant, S: 98183 m[IU]/mL — ABNORMAL HIGH (ref <5)

## 2010-10-20 LAB — WET PREP, GENITAL
Trich, Wet Prep: NONE SEEN
Yeast Wet Prep HPF POC: NONE SEEN

## 2010-10-20 LAB — URINALYSIS, ROUTINE W REFLEX MICROSCOPIC
Bilirubin Urine: NEGATIVE
Glucose, UA: NEGATIVE mg/dL
Hgb urine dipstick: NEGATIVE
Protein, ur: NEGATIVE mg/dL
Specific Gravity, Urine: 1.022 (ref 1.005–1.030)
Urobilinogen, UA: 1 mg/dL (ref 0.0–1.0)

## 2010-10-21 LAB — GC/CHLAMYDIA PROBE AMP, GENITAL
Chlamydia, DNA Probe: NEGATIVE
GC Probe Amp, Genital: NEGATIVE

## 2010-10-21 LAB — URINALYSIS, ROUTINE W REFLEX MICROSCOPIC
Hgb urine dipstick: NEGATIVE
Nitrite: NEGATIVE
Specific Gravity, Urine: 1.02 (ref 1.005–1.030)
Urobilinogen, UA: 0.2 mg/dL (ref 0.0–1.0)

## 2010-10-21 LAB — WET PREP, GENITAL

## 2010-10-21 LAB — CBC
HCT: 28.4 % — ABNORMAL LOW (ref 36.0–46.0)
Hemoglobin: 9.4 g/dL — ABNORMAL LOW (ref 12.0–15.0)
Platelets: 183 10*3/uL (ref 150–400)
RBC: 3.47 MIL/uL — ABNORMAL LOW (ref 3.87–5.11)
WBC: 8.1 10*3/uL (ref 4.0–10.5)

## 2010-10-21 LAB — URINE CULTURE

## 2010-10-21 LAB — HERPES SIMPLEX VIRUS CULTURE: Culture: NOT DETECTED

## 2010-10-22 LAB — WET PREP, GENITAL
Clue Cells Wet Prep HPF POC: NONE SEEN
Trich, Wet Prep: NONE SEEN
Yeast Wet Prep HPF POC: NONE SEEN

## 2010-10-22 LAB — GC/CHLAMYDIA PROBE AMP, GENITAL: GC Probe Amp, Genital: NEGATIVE

## 2010-10-22 LAB — URINALYSIS, ROUTINE W REFLEX MICROSCOPIC
Bilirubin Urine: NEGATIVE
Bilirubin Urine: NEGATIVE
Glucose, UA: NEGATIVE mg/dL
Glucose, UA: NEGATIVE mg/dL
Hgb urine dipstick: NEGATIVE
Hgb urine dipstick: NEGATIVE
Ketones, ur: NEGATIVE mg/dL
Ketones, ur: NEGATIVE mg/dL
Protein, ur: NEGATIVE mg/dL
Protein, ur: NEGATIVE mg/dL
Urobilinogen, UA: 0.2 mg/dL (ref 0.0–1.0)
pH: 6 (ref 5.0–8.0)

## 2010-10-22 LAB — CBC
HCT: 30.6 % — ABNORMAL LOW (ref 36.0–46.0)
Hemoglobin: 9.9 g/dL — ABNORMAL LOW (ref 12.0–15.0)
MCHC: 32.3 g/dL (ref 30.0–36.0)
MCV: 78.1 fL (ref 78.0–100.0)
RDW: 19 % — ABNORMAL HIGH (ref 11.5–15.5)

## 2010-10-24 LAB — URINALYSIS, ROUTINE W REFLEX MICROSCOPIC
Hgb urine dipstick: NEGATIVE
Ketones, ur: NEGATIVE mg/dL
Protein, ur: NEGATIVE mg/dL
Urobilinogen, UA: 0.2 mg/dL (ref 0.0–1.0)

## 2010-11-09 LAB — GC/CHLAMYDIA PROBE AMP, GENITAL: GC Probe Amp, Genital: NEGATIVE

## 2010-11-09 LAB — WET PREP, GENITAL: Clue Cells Wet Prep HPF POC: NONE SEEN

## 2010-11-09 LAB — CBC
HCT: 24.9 % — ABNORMAL LOW (ref 36.0–46.0)
Hemoglobin: 12.3 g/dL (ref 12.0–15.0)
Hemoglobin: 8.4 g/dL — ABNORMAL LOW (ref 12.0–15.0)
MCHC: 33.7 g/dL (ref 30.0–36.0)
MCV: 86.6 fL (ref 78.0–100.0)
RBC: 2.88 MIL/uL — ABNORMAL LOW (ref 3.87–5.11)
RBC: 4.22 MIL/uL (ref 3.87–5.11)
WBC: 10.1 10*3/uL (ref 4.0–10.5)
WBC: 7.5 10*3/uL (ref 4.0–10.5)

## 2010-11-09 LAB — RPR: RPR Ser Ql: NONREACTIVE

## 2010-11-10 LAB — WET PREP, GENITAL
Clue Cells Wet Prep HPF POC: NONE SEEN
Yeast Wet Prep HPF POC: NONE SEEN

## 2010-11-10 LAB — URINALYSIS, ROUTINE W REFLEX MICROSCOPIC
Glucose, UA: NEGATIVE mg/dL
Ketones, ur: NEGATIVE mg/dL
Nitrite: NEGATIVE
Nitrite: NEGATIVE
Protein, ur: NEGATIVE mg/dL
Specific Gravity, Urine: 1.015 (ref 1.005–1.030)
Urobilinogen, UA: 1 mg/dL (ref 0.0–1.0)
pH: 6 (ref 5.0–8.0)
pH: 6 (ref 5.0–8.0)

## 2010-11-10 LAB — URINE MICROSCOPIC-ADD ON

## 2010-11-10 LAB — URINE CULTURE

## 2010-11-12 LAB — URINALYSIS, ROUTINE W REFLEX MICROSCOPIC
Glucose, UA: NEGATIVE mg/dL
Hgb urine dipstick: NEGATIVE
Protein, ur: NEGATIVE mg/dL
pH: 6 (ref 5.0–8.0)

## 2010-11-13 LAB — GC/CHLAMYDIA PROBE AMP, GENITAL: GC Probe Amp, Genital: NEGATIVE

## 2010-11-17 LAB — URINALYSIS, ROUTINE W REFLEX MICROSCOPIC
Bilirubin Urine: NEGATIVE
Glucose, UA: NEGATIVE mg/dL
Hgb urine dipstick: NEGATIVE
Ketones, ur: NEGATIVE mg/dL
Nitrite: NEGATIVE
Protein, ur: NEGATIVE mg/dL
Specific Gravity, Urine: 1.02 (ref 1.005–1.030)
Urobilinogen, UA: 0.2 mg/dL (ref 0.0–1.0)
pH: 6 (ref 5.0–8.0)

## 2010-11-18 LAB — CBC
HCT: 35.1 % — ABNORMAL LOW (ref 36.0–46.0)
MCHC: 32.5 g/dL (ref 30.0–36.0)
MCV: 78.9 fL (ref 78.0–100.0)
MCV: 79.1 fL (ref 78.0–100.0)
Platelets: 209 10*3/uL (ref 150–400)
RBC: 3.66 MIL/uL — ABNORMAL LOW (ref 3.87–5.11)
RBC: 4.44 MIL/uL (ref 3.87–5.11)
RDW: 20.7 % — ABNORMAL HIGH (ref 11.5–15.5)
WBC: 7.7 10*3/uL (ref 4.0–10.5)

## 2010-11-18 LAB — URINALYSIS, ROUTINE W REFLEX MICROSCOPIC
Bilirubin Urine: NEGATIVE
Bilirubin Urine: NEGATIVE
Glucose, UA: NEGATIVE mg/dL
Glucose, UA: NEGATIVE mg/dL
Glucose, UA: NEGATIVE mg/dL
Glucose, UA: NEGATIVE mg/dL
Glucose, UA: NEGATIVE mg/dL
Hgb urine dipstick: NEGATIVE
Hgb urine dipstick: NEGATIVE
Hgb urine dipstick: NEGATIVE
Hgb urine dipstick: NEGATIVE
Hgb urine dipstick: NEGATIVE
Ketones, ur: NEGATIVE mg/dL
Ketones, ur: NEGATIVE mg/dL
Ketones, ur: NEGATIVE mg/dL
Nitrite: NEGATIVE
Nitrite: NEGATIVE
Protein, ur: NEGATIVE mg/dL
Protein, ur: NEGATIVE mg/dL
Protein, ur: 30 mg/dL — AB
Protein, ur: NEGATIVE mg/dL
Protein, ur: NEGATIVE mg/dL
Specific Gravity, Urine: 1.025 (ref 1.005–1.030)
Specific Gravity, Urine: 1.026 (ref 1.005–1.030)
Specific Gravity, Urine: 1.023 (ref 1.005–1.030)
Urobilinogen, UA: 1 mg/dL (ref 0.0–1.0)
Urobilinogen, UA: 1 mg/dL (ref 0.0–1.0)
pH: 6.5 (ref 5.0–8.0)
pH: 6 (ref 5.0–8.0)

## 2010-11-18 LAB — URINE CULTURE
Colony Count: 40000
Colony Count: NO GROWTH

## 2010-11-18 LAB — BASIC METABOLIC PANEL
BUN: 6 mg/dL (ref 6–23)
Chloride: 105 mEq/L (ref 96–112)
Creatinine, Ser: 0.75 mg/dL (ref 0.4–1.2)
Glucose, Bld: 74 mg/dL (ref 70–99)
Potassium: 3.9 mEq/L (ref 3.5–5.1)

## 2010-11-18 LAB — URINE MICROSCOPIC-ADD ON: RBC / HPF: NONE SEEN RBC/hpf (ref <3)

## 2010-11-18 LAB — DIFFERENTIAL
Basophils Absolute: 0.1 10*3/uL (ref 0.0–0.1)
Basophils Relative: 1 % (ref 0–1)
Eosinophils Absolute: 0.2 10*3/uL (ref 0.0–0.7)
Eosinophils Absolute: 0.1 10*3/uL (ref 0.0–0.7)
Eosinophils Relative: 1 % (ref 0–5)
Lymphs Abs: 2.2 10*3/uL (ref 0.7–4.0)
Monocytes Relative: 8 % (ref 3–12)
Neutro Abs: 5.4 10*3/uL (ref 1.7–7.7)
Neutrophils Relative %: 60 % (ref 43–77)

## 2010-11-18 LAB — GC/CHLAMYDIA PROBE AMP, GENITAL
Chlamydia, DNA Probe: NEGATIVE
GC Probe Amp, Genital: NEGATIVE
GC Probe Amp, Genital: NEGATIVE

## 2010-11-18 LAB — WET PREP, GENITAL
Clue Cells Wet Prep HPF POC: NONE SEEN
Trich, Wet Prep: NONE SEEN
Yeast Wet Prep HPF POC: NONE SEEN

## 2010-11-18 LAB — RPR: RPR Ser Ql: NONREACTIVE

## 2010-12-16 ENCOUNTER — Emergency Department (HOSPITAL_COMMUNITY)
Admission: EM | Admit: 2010-12-16 | Discharge: 2010-12-16 | Disposition: A | Discharge status: Home / Self Care | Payer: Medicaid Other | Attending: Emergency Medicine | Admitting: Emergency Medicine

## 2010-12-16 DIAGNOSIS — A499 Bacterial infection, unspecified: Secondary | ICD-10-CM | POA: Insufficient documentation

## 2010-12-16 DIAGNOSIS — A5901 Trichomonal vulvovaginitis: Secondary | ICD-10-CM | POA: Insufficient documentation

## 2010-12-16 DIAGNOSIS — R109 Unspecified abdominal pain: Secondary | ICD-10-CM | POA: Insufficient documentation

## 2010-12-16 DIAGNOSIS — N760 Acute vaginitis: Secondary | ICD-10-CM | POA: Insufficient documentation

## 2010-12-16 DIAGNOSIS — M549 Dorsalgia, unspecified: Secondary | ICD-10-CM | POA: Insufficient documentation

## 2010-12-16 DIAGNOSIS — B9689 Other specified bacterial agents as the cause of diseases classified elsewhere: Secondary | ICD-10-CM | POA: Insufficient documentation

## 2010-12-16 LAB — WET PREP, GENITAL: Yeast Wet Prep HPF POC: NONE SEEN

## 2010-12-16 LAB — URINALYSIS, ROUTINE W REFLEX MICROSCOPIC
Bilirubin Urine: NEGATIVE
Glucose, UA: NEGATIVE mg/dL
Ketones, ur: NEGATIVE mg/dL
pH: 6.5 (ref 5.0–8.0)

## 2010-12-16 NOTE — Op Note (Signed)
NAMESHAILEE, FOOTS              ACCOUNT NO.:  [id number]   MEDICAL RECORD NO.:  [id number]          PATIENT TYPE:  AMB   LOCATION:  SDC                           FACILITY:  [facility]   PHYSICIAN:  Charles A. Clearance Coots, M.D.DATE OF BIRTH:  1980/11/16   DATE OF PROCEDURE:  05/17/2007  DATE OF DISCHARGE:                               OPERATIVE REPORT   PREOPERATIVE DIAGNOSIS:  Eight-week intrauterine fetal demise on  ultrasound.   POSTOPERATIVE DIAGNOSIS:  Eight-week intrauterine fetal demise on  ultrasound.   PROCEDURE:  Suction dilation and evacuation.   SURGEON:  Charles A. Clearance Coots, MD   ANESTHESIA:  MAC with paracervical block.   ESTIMATED BLOOD LOSS:  100 mL.   COMPLICATIONS:  None.   SPECIMEN:  Products of conception.   OPERATION:  The patient was brought to the operating room and after  satisfactory IV sedation, legs were brought up in stirrups and the  vagina was prepped and draped in the usual sterile fashion.  The urinary  bladder was emptied of approximately 50 mL of clear urine.  Bimanual  examination revealed the uterus to be midposition, approximately 8  weeks' size.  A sterile speculum was inserted in the vaginal vault and  cervix was isolated.  The anterior lip of the cervix was grasped with a  single-tooth tenaculum.  The axis of the uterus was sounded.  the cervix  was dilated to a 25-mm Pratt dilator.  A #8 suction catheter was then  easily introduced into the uterine cavity and all contents were  evacuated.  The endometrial surface was then curetted with a small sharp  curette and no further products of conception were obtained.  There was  no active bleeding at the conclusion of the procedure.  All instruments  were retired.  The patient tolerated the procedure well, was transported  to the recovery room in satisfactory condition.      Charles A. Clearance Coots, M.D.  Electronically Signed     CAH/MEDQ  D:  05/17/2007  T:  05/17/2007  Job:  [id number]

## 2010-12-16 NOTE — Discharge Summary (Signed)
NAME:  Sarah [name], Sarah [name]              ACCOUNT NO.:  [id number]   MEDICAL RECORD NO.:  [id number]          PATIENT TYPE:  OBV   LOCATION:  9156                          [facility]ACILITY:  [facility]   PHYSICIAN:  Arlyce Harman, MD     DATE OF BIRTH:  08-Sep-1980   DATE OF ADMISSION:  12/19/2008  DATE OF DISCHARGE:  12/20/2008                               DISCHARGE SUMMARY   The patient is a 30 year old parous female who is [redacted] weeks gestation  with twins.  She was admitted because of nausea and indigestion.  The  patient has been monitored overnight, and no significant labor is  detected.  She did complain of right subcostal pain that appeared to be  related to fetal movements but this morning, she noted that this pain  had resolved.  She was given Zofran for nausea and is currently doing  well without vomiting this morning or nausea and is ready to go home.  There is no pertinent laboratory data.   FINAL DIAGNOSES:  Right subcostal pain, musculoskeletal in nature, 28-  week twin pregnancy.   FINDINGS:  No significant findings.   COMPLICATIONS:  None   PROCEDURES PERFORMED:  Monitoring of labor and fetal heart tones.  Condition; the patient is in excellent condition and has no current  complaints.  The patient was given instructions to lie on the side  opposite to the pain that she has experienced in the past if it recurs  and to gently move the fetal part away from the area that is tender.  She may also use a heating pad to this area on the low setting for 15  minutes.  Her physical activity is rest with bathroom privileges.  Diet  is regular.   MEDICATION:  Prenatal vitamins every day.  She is to take iron  supplements for anemia Ferralet 90 one a day or a generic substitute is  acceptable.   Ms. Gaylan Gerold already has an appointment to be followed up in our office in  1 week.      Arlyce Harman, MD  Electronically Signed     EG/MEDQ  D:  12/20/2008  T:  12/21/2008  Job:  [id number]

## 2010-12-17 LAB — GC/CHLAMYDIA PROBE AMP, GENITAL: Chlamydia, DNA Probe: NEGATIVE
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
## 2010-12-19 NOTE — H&P (Signed)
[facility]  Patient:    Sarah [name], Sarah [name] Visit Number: [id number] MRN: [id number]          Service Type: OBS Location: 910A 9105 01 Attending Physician:  Jaymes Graff A Adm. Date:  03/26/2001                           History and Physical  HISTORY OF PRESENT ILLNESS:   The patient is an 30 year old primigravida who presented to [facility] last evening complaining of headache, blurred vision, and lower abdominal pain occasionally.  She denied any right upper quadrant pain, denied any leakage of fluid; baby has been moving.  History is significant for patient having a fall where she hit her belly last week.  She was then presented to the office for a KUB was done and found to be negative.  PAST MEDICAL HISTORY:         Unremarkable.  PAST SURGICAL HISTORY:        Unremarkable.  ALLERGIES:                    Patient has no known drug allergies.  PAST OB HISTORY:              Significant for two spontaneous abortions, one happening in 1998 and one happening in 2000 without any complications.  SOCIAL HISTORY:               Negative x 3.                                On presentation as stated to the [facility], patient was given Darvocet and her headache did get better.  However, she was called back with just the PAs labs drawn and noted to have slightly elevated LFTs. The patient presented again today for repeat labs and was still noted to have elevated liver function tests.  PHYSICAL EXAMINATION:  VITAL SIGNS:                  Patient is afebrile with pulse rate 84 to 100, respiratory rate between 20, and blood pressure ranged from 106 to 112 systolic over 50 to 60 diastolic.  GENERAL:                      Patient is in no apparent distress.  HEART:                        Regular.  LUNGS:                        Clear.  ABDOMEN:                      Gravid, soft.  Mild tenderness anteriorly.  EXTREMITIES:                  No clubbing, cyanosis, or  edema.  CERVIX:                       Deferred.  LABORATORY DATA:              Her labs are consistent with a UA showing no protein, hemoglobin of 10.6 with a white count of 9.8, platelets at 251, BUN and creatinine 6 and 0.7, and LFTs from  yesterday had SGOT of 45 and an SGPT of 62, and repeat today had an AST of 50 and an ALT of 63.  BUN and creatinine is still normal and CBC was stable with a white count of 11.7, hemoglobin stable at 10.6, and platelets of 253.  IMPRESSION AND PLAN:          Patient is an 30 year old female at 27-2/7ths weeks with transient elevation in liver function enzymes.  All other labs are within normal limits.  Blood pressure is within normal limits.  Ruled out atypical presentation of preeclampsia.  A 24-hour urine currently is being collected.  Hepatitis panel was sent to rule out any hepatitis.  Patient has no symptoms of gallbladder disease and a complete ultrasound was ordered on the infant in the a.m. to evaluate fluid and placenta.  Will repeat liver function tests again in the morning and patient is on a 23-hour observation. Attending Physician:  Michael Litter DD:  03/26/01 TD:  03/26/01 Job: [id number]

## 2010-12-19 NOTE — Discharge Summary (Signed)
NAME:  CAMREE, WIGINGTON              ACCOUNT NO.:  [id number]   MEDICAL RECORD NO.:  [id number]          PATIENT TYPE:  INP   LOCATION:  9124                          [facility]ACILITY:  [facility]   PHYSICIAN:  Roseanna Rainbow, M.D.DATE OF BIRTH:  1981/03/21   DATE OF ADMISSION:  05/05/2006  DATE OF DISCHARGE:  05/07/2006                               DISCHARGE SUMMARY   CHIEF COMPLAINT:  The patient is a 30 year old gravida 4, para 3, with  an estimated date of confinement of August 11, 2006 with an intrauterine  pregnancy at 25+ weeks, complaining of nausea and vomiting.   HISTORY OF PRESENT ILLNESS:  Please see the above. The patient reports a  several-day history of nausea and vomiting. She reports a recent  exposure to a colleague at work with similar symptoms.   ALLERGIES:  LATEX.   MEDICATIONS:  Prenatal vitamins.   SOCIAL HISTORY:  She denies any tobacco, ethanol or drug use.   OB RISK FACTORS:  None.   PAST OBSTETRIC HISTORY:  She has had 3 spontaneous vaginal deliveries.   PAST MEDICAL HISTORY:  She denies.   PAST SURGICAL HISTORY:  She denies.   PAST GYN HISTORY:  Noncontributory.   PHYSICAL EXAMINATION:  VITAL SIGNS: Temperature 97.5, pulse 67,  respirations 16, blood pressure 109/66.  GENERAL: Slightly toxic appearing, in no apparent distress.  ABDOMEN: Gravid, nontender.  PELVIC EXAM: Deferred.   Doppler fetal heart tones 144.   LABORATORY DATA:  Urinalysis specific gravity greater than 1.030.  Complete metabolic panel normal. White blood cell count 8400, hemoglobin  10.8, platelets 196,000.   ASSESSMENT:  Intrauterine pregnancy at 25+ weeks with a likely viral  gastroenteritis; mild dehydration.   PLAN:  Admission, supportive measures.   HOSPITAL COURSE:  The patient was admitted, given IV hydration and  antiemetics. Her symptoms gradually improved. She is discharged to home  on hospital day number two, tolerating a regular diet.   DISCHARGE DIAGNOSES:  1.  Intrauterine pregnancy at 25+ weeks.  2. Viral gastroenteritis.   CONDITION ON DISCHARGE:  Stable.   DIET:  BRAT diet.   MEDICATIONS:  None.   ACTIVITY:  No restrictions.   DISPOSITION:  The patient is to keep her appointment in the office on  May 19, 2006.      Roseanna Rainbow, M.D.  Electronically Signed     LAJ/MEDQ  D:  07/06/2006  T:  07/06/2006  Job:  [id number]

## 2010-12-19 NOTE — Discharge Summary (Signed)
NAME:  Sarah [name], Sarah [name]              ACCOUNT NO.:  [id number]   MEDICAL RECORD NO.:  [id number]          PATIENT TYPE:  INP   LOCATION:  9135                          [facility]ILITY[facility]  [facility]   PHYSICIAN:  Arlyce Harman, MD     DATE OF BIRTH:  26-Dec-1980   DATE OF ADMISSION:  02/18/2009  DATE OF DISCHARGE:  02/21/2009                               DISCHARGE SUMMARY   HOSPITAL COURSE:  The patient is a 30 year old gravida 6, para 4, who is  31 and 6/7th weeks that is brought in for induction due to having twins  one with IUGR and oligohydramnios.  In addition, she does have a  positive group B strep.  The patient's prenatal course has been noted  for multiple visits for pelvic pain and pressure, although she has not  had preterm labor.  She has been followed by Maternal Fetal Medicine  doctor and had an ultrasound today which revealed oligohydramnios and  IUGR in twin A, and therefore, is brought in for delivery.  Labor  progressed well with pitocin induction, and baby A was delivered on February 19, 2009, at 1512 via normal spontaneous vaginal delivery and baby B was  delivered on February 19, 2009, at 1529 via normal spontaneous vaginal  delivery.  Postpartum has been complicated and the patient is discharged  on her second post delivery day in excellent condition.  She will be  followed up in the office in a week.   MEDICATIONS:  Motrin 400 mg 2 p.o. every hours.  She is to continue on  her prenatal vitamins daily and take an iron supplement daily.      Arlyce Harman, MD  Electronically Signed     EG/MEDQ  D:  03/30/2009  T:  03/30/2009  Job:  [phone]

## 2010-12-19 NOTE — Discharge Summary (Signed)
[facility]  Patient:    Sarah [name], Sarah [name] Visit Number: [id number] MRN: [id number]          Service Type: OBS Location: 910A 9105 01 Attending Physician:  Jaymes Graff A Dictated by:   Pierre Bali Normand Sloop, M.D. Adm. Date:  03/26/2001 Disc. Date: 03/27/2001                             Discharge Summary  23-HOUR OBSERVATION  HISTORY:                      The patient is an 30 year old, G3, P0-0-2-0, who presented to the hospital on Friday evening, August 23, complaining of seeing double, headache, and some lower abdominal pain. The patient stated that earlier in the week she fell down the steps, hit her belly, but did not come into the office until 24 to 48 hours later. On her office visit she was seen to be stable. KB ______ was found to be negative. The patients pressures remained stable. Headache improved with Darvocet. PIH labs were sent and she was sent home. She was actually brought back in for slightly elevated liver enzymes to follow up and have them redrawn. On August 24, the patient said she had some headaches but they were transient, would come and go. No blurred vision and no right upper quadrant pain on physical exam.  HOSPITAL COURSE:              The patient was then brought in for observation to give a hepatitis panel, labs, and start a 24-hour urine. The patients ultrasound was within normal limits. Her BPP was 6/8, -2 for breathing. The placenta was seen to be posterior and cervix with 3.7 cm. Her hepatitis panel and 24-hour urine was still pending. Today she did still complain of a headache, however, her blood pressures remained stable and she had no hypertensive blood pressures. She had no right upper quadrant pain and no blurred vision. Vital signs were stable. Her heart was regular. Lungs were clear. Abdomen was soft and nontender, gravid. Extremities were 2+ deep tendon reflexes, no clonus and no edema.  LABORATORY DATA:               Her D-dimer was 0.53, just slightly elevated. Fibrinogen was 629, uric acid was 3.4. UA had negative protein. Chem 7 was basically unchanged from August 24 with a BUN/creatinine of 6 and 0.8. Other labs were within normal limits. Her white count on August 24 was 9.8, hemoglobin was 10.6, which was stable, and platelets were 251,000. Her LFTs were slightly decreased, AST was 33 and ALT was 56, as opposed to her AST of August 24 which was 50 and ALT was 63.  The patient expressed her wishes to go home this evening. After the 24-hour urine the lab was called and was told that the hepatitis B panel and 24-hour urine would not be back until tomorrow, August 26. The patient was told that if she took a Motrin and her headache went away and she had no other symptoms, that when the 24-hour was finished, she would be able to discharge home and to follow up with [place] in the office in one to two days and we will follow up on her 24-hour urine. She was also given PIH precautions and told to call if she gets a headache unrelieved by Motrin, blurred vision, or right upper quadrant pain.  If it turns out her whole workup is negative, and she still has slight transient elevation LFTs, she will be referred to a GI specialist. Dictated by:   Pierre Bali. Normand Sloop, M.D. Attending Physician:  Michael Litter DD:  03/27/01 TD:  03/28/01 Job: [id number]

## 2010-12-21 ENCOUNTER — Emergency Department (HOSPITAL_COMMUNITY): Payer: Medicaid Other

## 2010-12-21 ENCOUNTER — Emergency Department (HOSPITAL_COMMUNITY)
Admission: EM | Admit: 2010-12-21 | Discharge: 2010-12-21 | Disposition: A | Discharge status: Home / Self Care | Payer: Medicaid Other | Attending: Emergency Medicine | Admitting: Emergency Medicine

## 2010-12-21 DIAGNOSIS — N83209 Unspecified ovarian cyst, unspecified side: Secondary | ICD-10-CM | POA: Insufficient documentation

## 2010-12-21 DIAGNOSIS — N949 Unspecified condition associated with female genital organs and menstrual cycle: Secondary | ICD-10-CM | POA: Insufficient documentation

## 2010-12-21 LAB — URINALYSIS, ROUTINE W REFLEX MICROSCOPIC
Bilirubin Urine: NEGATIVE
Glucose, UA: NEGATIVE mg/dL
Hgb urine dipstick: NEGATIVE
Ketones, ur: NEGATIVE mg/dL
pH: 5.5 (ref 5.0–8.0)

## 2011-01-13 ENCOUNTER — Emergency Department (HOSPITAL_BASED_OUTPATIENT_CLINIC_OR_DEPARTMENT_OTHER)
Admission: EM | Admit: 2011-01-13 | Discharge: 2011-01-14 | Disposition: A | Discharge status: Home / Self Care | Payer: Medicaid Other | Attending: Emergency Medicine | Admitting: Emergency Medicine

## 2011-01-13 DIAGNOSIS — N760 Acute vaginitis: Secondary | ICD-10-CM | POA: Insufficient documentation

## 2011-01-14 LAB — PREGNANCY, URINE: Preg Test, Ur: NEGATIVE

## 2011-01-14 LAB — WET PREP, GENITAL: Trich, Wet Prep: NONE SEEN

## 2011-01-14 LAB — URINALYSIS, ROUTINE W REFLEX MICROSCOPIC
Bilirubin Urine: NEGATIVE
Glucose, UA: NEGATIVE mg/dL
Hgb urine dipstick: NEGATIVE
Specific Gravity, Urine: 1.022 (ref 1.005–1.030)
pH: 6 (ref 5.0–8.0)

## 2011-01-15 LAB — GC/CHLAMYDIA PROBE AMP, GENITAL: GC Probe Amp, Genital: NEGATIVE

## 2011-02-19 ENCOUNTER — Emergency Department (HOSPITAL_COMMUNITY)
Admission: EM | Admit: 2011-02-19 | Discharge: 2011-02-19 | Disposition: A | Discharge status: Home / Self Care | Payer: Medicaid Other | Attending: Emergency Medicine | Admitting: Emergency Medicine

## 2011-02-19 DIAGNOSIS — M545 Low back pain, unspecified: Secondary | ICD-10-CM | POA: Insufficient documentation

## 2011-02-19 DIAGNOSIS — M549 Dorsalgia, unspecified: Secondary | ICD-10-CM | POA: Insufficient documentation

## 2011-02-19 DIAGNOSIS — R350 Frequency of micturition: Secondary | ICD-10-CM | POA: Insufficient documentation

## 2011-02-19 LAB — URINALYSIS, ROUTINE W REFLEX MICROSCOPIC
Glucose, UA: NEGATIVE mg/dL
Hgb urine dipstick: NEGATIVE
Ketones, ur: NEGATIVE mg/dL
Leukocytes, UA: NEGATIVE
Protein, ur: NEGATIVE mg/dL
pH: 6 (ref 5.0–8.0)

## 2011-04-23 LAB — POCT PREGNANCY, URINE: Preg Test, Ur: NEGATIVE

## 2011-04-23 LAB — POCT URINALYSIS DIP (DEVICE)
Bilirubin Urine: NEGATIVE
Ketones, ur: NEGATIVE
Operator id: 208841
Protein, ur: NEGATIVE
Specific Gravity, Urine: 1.025

## 2011-04-23 LAB — WET PREP, GENITAL: Yeast Wet Prep HPF POC: NONE SEEN

## 2011-04-23 LAB — GC/CHLAMYDIA PROBE AMP, GENITAL
Chlamydia, DNA Probe: NEGATIVE
GC Probe Amp, Genital: NEGATIVE

## 2011-04-27 LAB — URINE MICROSCOPIC-ADD ON

## 2011-04-27 LAB — POCT CARDIAC MARKERS
CKMB, poc: 1.7
Troponin i, poc: <0.05

## 2011-04-27 LAB — DIFFERENTIAL
Basophils Absolute: 0.1
Lymphocytes Relative: 32
Lymphs Abs: 3
Monocytes Absolute: 0.8
Neutro Abs: 5.4

## 2011-04-27 LAB — URINALYSIS, ROUTINE W REFLEX MICROSCOPIC
Bilirubin Urine: NEGATIVE
Glucose, UA: NEGATIVE
Hgb urine dipstick: NEGATIVE
Specific Gravity, Urine: 1.028
pH: 6

## 2011-04-27 LAB — CBC
HCT: 27.9 — ABNORMAL LOW
Hemoglobin: 9.1 — ABNORMAL LOW
RBC: 3.94
RDW: 18.6 — ABNORMAL HIGH
WBC: 9.5

## 2011-04-27 LAB — BASIC METABOLIC PANEL
Calcium: 9.1
GFR calc Af Amer: >60
GFR calc non Af Amer: >60
Glucose, Bld: 96
Potassium: 3.7
Sodium: 139

## 2011-04-30 LAB — URINALYSIS, ROUTINE W REFLEX MICROSCOPIC
Bilirubin Urine: NEGATIVE
Ketones, ur: NEGATIVE
Nitrite: NEGATIVE
Specific Gravity, Urine: 1.029
Urobilinogen, UA: 0.2

## 2011-04-30 LAB — WET PREP, GENITAL: Yeast Wet Prep HPF POC: NONE SEEN

## 2011-04-30 LAB — GC/CHLAMYDIA PROBE AMP, GENITAL: GC Probe Amp, Genital: NEGATIVE

## 2011-05-08 LAB — CBC
MCHC: 32.3 g/dL (ref 30.0–36.0)
MCV: 74.3 fL — ABNORMAL LOW (ref 78.0–100.0)
RBC: 4 MIL/uL (ref 3.87–5.11)
RDW: 20.3 % — ABNORMAL HIGH (ref 11.5–15.5)

## 2011-05-08 LAB — POCT I-STAT, CHEM 8
Creatinine, Ser: 0.9 mg/dL (ref 0.4–1.2)
Glucose, Bld: 83 mg/dL (ref 70–99)
HCT: 31 % — ABNORMAL LOW (ref 36.0–46.0)
Hemoglobin: 10.5 g/dL — ABNORMAL LOW (ref 12.0–15.0)
Potassium: 3.6 mEq/L (ref 3.5–5.1)
Sodium: 139 mEq/L (ref 135–145)
TCO2: 21 mmol/L (ref 0–100)

## 2011-05-08 LAB — WET PREP, GENITAL
Clue Cells Wet Prep HPF POC: NONE SEEN
Trich, Wet Prep: NONE SEEN
Yeast Wet Prep HPF POC: NONE SEEN

## 2011-05-08 LAB — GC/CHLAMYDIA PROBE AMP, GENITAL: Chlamydia, DNA Probe: NEGATIVE

## 2011-05-08 LAB — URINALYSIS, ROUTINE W REFLEX MICROSCOPIC
Glucose, UA: NEGATIVE mg/dL
Ketones, ur: NEGATIVE mg/dL
Nitrite: NEGATIVE
Specific Gravity, Urine: 1.026 (ref 1.005–1.030)
pH: 6 (ref 5.0–8.0)

## 2011-05-08 LAB — DIFFERENTIAL
Basophils Absolute: 0 10*3/uL (ref 0.0–0.1)
Basophils Relative: 0 % (ref 0–1)
Eosinophils Absolute: 0.2 10*3/uL (ref 0.0–0.7)
Monocytes Relative: 8 % (ref 3–12)
Neutro Abs: 5.1 10*3/uL (ref 1.7–7.7)
Neutrophils Relative %: 60 % (ref 43–77)

## 2011-05-08 LAB — POCT PREGNANCY, URINE: Preg Test, Ur: POSITIVE

## 2011-05-13 LAB — DIFFERENTIAL
Basophils Absolute: 0
Basophils Relative: 1
Eosinophils Absolute: 0.2
Eosinophils Relative: 3
Neutrophils Relative %: 47

## 2011-05-13 LAB — HCG, QUANTITATIVE, PREGNANCY: hCG, Beta Chain, Quant, S: 36 — ABNORMAL HIGH

## 2011-05-13 LAB — BASIC METABOLIC PANEL
BUN: 8
Chloride: 106
Creatinine, Ser: 0.87
Glucose, Bld: 101 — ABNORMAL HIGH
Potassium: 3.6

## 2011-05-13 LAB — CBC
HCT: 28.7 — ABNORMAL LOW
MCV: 72.3 — ABNORMAL LOW
Platelets: 233
RDW: 18.8 — ABNORMAL HIGH

## 2011-05-14 LAB — URINALYSIS, ROUTINE W REFLEX MICROSCOPIC
Bilirubin Urine: NEGATIVE
Ketones, ur: NEGATIVE
Nitrite: NEGATIVE
Protein, ur: NEGATIVE
Urobilinogen, UA: 1

## 2011-05-14 LAB — WET PREP, GENITAL: Yeast Wet Prep HPF POC: NONE SEEN

## 2011-05-14 LAB — URINE CULTURE: Colony Count: 60000

## 2011-05-14 LAB — CBC
Hemoglobin: 10.4 — ABNORMAL LOW
MCV: 73.8 — ABNORMAL LOW
RBC: 4.33
WBC: 6.8

## 2011-05-14 LAB — GC/CHLAMYDIA PROBE AMP, GENITAL
Chlamydia, DNA Probe: NEGATIVE
GC Probe Amp, Genital: NEGATIVE

## 2011-05-14 LAB — URINE MICROSCOPIC-ADD ON: RBC / HPF: NONE SEEN

## 2011-05-15 LAB — URINALYSIS, ROUTINE W REFLEX MICROSCOPIC
Bilirubin Urine: NEGATIVE
Glucose, UA: NEGATIVE
Glucose, UA: NEGATIVE
Ketones, ur: NEGATIVE
Ketones, ur: NEGATIVE
Specific Gravity, Urine: 1.023
pH: 6
pH: 6

## 2011-05-15 LAB — WET PREP, GENITAL
Trich, Wet Prep: NONE SEEN
Yeast Wet Prep HPF POC: NONE SEEN
Yeast Wet Prep HPF POC: NONE SEEN

## 2011-05-15 LAB — RPR: RPR Ser Ql: NONREACTIVE

## 2011-05-15 LAB — GC/CHLAMYDIA PROBE AMP, GENITAL: Chlamydia, DNA Probe: NEGATIVE

## 2011-05-15 LAB — URINE MICROSCOPIC-ADD ON

## 2011-05-15 LAB — HCG, QUANTITATIVE, PREGNANCY: hCG, Beta Chain, Quant, S: 4727 — ABNORMAL HIGH

## 2011-05-28 ENCOUNTER — Emergency Department (HOSPITAL_COMMUNITY)
Admission: EM | Admit: 2011-05-28 | Discharge: 2011-05-29 | Disposition: A | Discharge status: Home / Self Care | Payer: Medicaid Other | Attending: Emergency Medicine | Admitting: Emergency Medicine

## 2011-05-28 ENCOUNTER — Emergency Department (HOSPITAL_COMMUNITY): Payer: Medicaid Other

## 2011-05-28 DIAGNOSIS — N949 Unspecified condition associated with female genital organs and menstrual cycle: Secondary | ICD-10-CM | POA: Insufficient documentation

## 2011-05-28 LAB — URINALYSIS, ROUTINE W REFLEX MICROSCOPIC
Hgb urine dipstick: NEGATIVE
Leukocytes, UA: NEGATIVE
Protein, ur: NEGATIVE mg/dL
Specific Gravity, Urine: 1.023 (ref 1.005–1.030)
Urobilinogen, UA: 1 mg/dL (ref 0.0–1.0)

## 2011-05-28 LAB — WET PREP, GENITAL
Trich, Wet Prep: NONE SEEN
Yeast Wet Prep HPF POC: NONE SEEN

## 2011-05-29 LAB — GC/CHLAMYDIA PROBE AMP, GENITAL: GC Probe Amp, Genital: NEGATIVE

## 2011-08-04 NOTE — L&D Delivery Note (Signed)
Delivery Note At  a viable unspecified sex was delivered via  (Presentation: ;  ).  APGAR: , ; weight .   Placenta status: , .  Cord:  with the following complications: .  Cord pH: not done  Anesthesia: Epidural  Episiotomy:  Lacerations:  Suture Repair: 2.0 Est. Blood Loss (mL):   Mom to postpartum.  Baby to nursery-stable.  MARSHALL,BERNARD A 05/14/2012, 5:05 PM

## 2011-09-05 ENCOUNTER — Encounter (HOSPITAL_COMMUNITY): Payer: Self-pay

## 2011-09-05 ENCOUNTER — Emergency Department (HOSPITAL_COMMUNITY)
Admission: EM | Admit: 2011-09-05 | Discharge: 2011-09-05 | Disposition: A | Discharge status: Home / Self Care | Payer: Medicaid Other | Source: Home / Self Care | Attending: Family Medicine | Admitting: Family Medicine

## 2011-09-05 DIAGNOSIS — H73011 Bullous myringitis, right ear: Secondary | ICD-10-CM

## 2011-09-05 DIAGNOSIS — H73019 Bullous myringitis, unspecified ear: Secondary | ICD-10-CM

## 2011-09-05 MED ORDER — ANTIPYRINE-BENZOCAINE 5.4-1.4 % OT SOLN: 3.0000 [drp] | Freq: Four times a day (QID) | OTIC | Status: AC | PRN

## 2011-09-05 MED ORDER — FEXOFENADINE-PSEUDOEPHED ER 60-120 MG PO TB12: 1.0000 | ORAL_TABLET | Freq: Two times a day (BID) | ORAL | Status: DC

## 2011-09-05 MED ORDER — CIPROFLOXACIN-DEXAMETHASONE 0.3-0.1 % OT SUSP: 4.0000 [drp] | Freq: Two times a day (BID) | OTIC | Status: AC

## 2011-09-05 NOTE — ED Notes (Signed)
Pt has rt sided earache since yesterday

## 2011-09-06 NOTE — ED Provider Notes (Signed)
History     CSN: [id number]  Arrival date & time 09/05/11  0910   First MD Initiated Contact with Patient 09/05/11 [phone]      Chief Complaint  Patient presents with  . Otalgia    (Consider location/radiation/quality/duration/timing/severity/associated sxs/prior treatment) HPI Comments: 31 y/o female with cold like symptoms for abut 3 days, no fever started with severe right side ear pain since yesterday unsure if drainage. No tinnitus. No headache, cough non productive, no shortness of breath or chest pain, no sore throat.    History reviewed. No pertinent past medical history.  History reviewed. No pertinent past surgical history.  History reviewed. No pertinent family history.  History  Substance Use Topics  . Smoking status: Never Smoker   . Smokeless tobacco: Not on file  . Alcohol Use: No    OB History    Grav Para Term Preterm Abortions TAB SAB Ect Mult Living                  Review of Systems  Constitutional: Negative for fever and chills.  HENT: Positive for ear pain, congestion and rhinorrhea. Negative for sore throat, trouble swallowing, neck pain, neck stiffness, sinus pressure and tinnitus.   Eyes: Negative for redness.  Respiratory: Positive for cough. Negative for chest tightness, shortness of breath and wheezing.   Gastrointestinal: Negative for nausea, vomiting, abdominal pain and diarrhea.  Musculoskeletal: Negative for myalgias and arthralgias.  Skin: Negative for rash.  Neurological: Negative for headaches.    Allergies  Latex  Home Medications   Current Outpatient Rx  Name Route Sig Dispense Refill  . UNKNOWN TO PATIENT  Using sons numbing gtts?    . ANTIPYRINE-BENZOCAINE 5.4-1.4 % OT SOLN Right Ear Place 3 drops into the right ear 4 (four) times daily as needed for pain. 10 mL 0  . CIPROFLOXACIN-DEXAMETHASONE 0.3-0.1 % OT SUSP Right Ear Place 4 drops into the right ear 2 (two) times daily. Use for 5-7 days 7.5 mL 0  .  FEXOFENADINE-PSEUDOEPHED ER 60-120 MG PO TB12 Oral Take 1 tablet by mouth every 12 (twelve) hours. 30 tablet 0    BP 110/73  Pulse 64  Temp(Src) 98.4 F (36.9 C) (Oral)  Resp 16  SpO2 100%  LMP 08/06/2011  Physical Exam  Nursing note and vitals reviewed. Constitutional: She is oriented to person, place, and time. She appears well-developed and well-nourished. No distress.  HENT:  Head: Normocephalic and atraumatic.  Right Ear: External ear normal.  Left Ear: External ear normal.  Ears:  Mouth/Throat: Oropharynx is clear and moist. No oropharyngeal exudate.       Nasal congestion with clear rhinorrhea. Right TM has a big bullae extending almost to half of the membrane with clear to amber color fluid behind the TM. Light reflex conserved no significant erythema or swelling of the membrane.  No vesicles in ear canal than in general appears normal. Left ear canal appears normal, left TM has clear fluid behind no redness dullness, bulging  or swelling.  Eyes: Conjunctivae and EOM are normal. Pupils are equal, round, and reactive to light. Right eye exhibits no discharge. Left eye exhibits no discharge.  Neck: Neck supple.  Cardiovascular: Normal heart sounds.   Pulmonary/Chest: Breath sounds normal. No respiratory distress. She has no wheezes. She has no rales. She exhibits no tenderness.  Lymphadenopathy:    She has no cervical adenopathy.  Neurological: She is alert and oriented to person, place, and time.  Skin: No rash noted.  ED Course  Procedures (including critical care time)  Labs Reviewed - No data to display No results found.   1. Bullous myringitis of right ear       MDM  Likely viral bullous myringitis, no other vesicles in ear canal to suspect herpetic origen.  Prescribed auralgan, ciprodex , antihistamine with decongestant. Follow up with ENT if otorrhea or persistent pain after 1 week or earlier if worsening symptoms.        Sharin Grave,  MD 09/07/11 1242

## 2011-09-24 ENCOUNTER — Emergency Department (HOSPITAL_COMMUNITY)
Admission: EM | Admit: 2011-09-24 | Discharge: 2011-09-24 | Disposition: A | Discharge status: Home / Self Care | Payer: Medicaid Other | Attending: Emergency Medicine | Admitting: Emergency Medicine

## 2011-09-24 ENCOUNTER — Encounter (HOSPITAL_COMMUNITY): Payer: Self-pay | Admitting: Emergency Medicine

## 2011-09-24 ENCOUNTER — Emergency Department (HOSPITAL_COMMUNITY): Payer: Medicaid Other

## 2011-09-24 DIAGNOSIS — O99891 Other specified diseases and conditions complicating pregnancy: Secondary | ICD-10-CM | POA: Insufficient documentation

## 2011-09-24 DIAGNOSIS — Z3490 Encounter for supervision of normal pregnancy, unspecified, unspecified trimester: Secondary | ICD-10-CM

## 2011-09-24 DIAGNOSIS — O210 Mild hyperemesis gravidarum: Secondary | ICD-10-CM | POA: Insufficient documentation

## 2011-09-24 DIAGNOSIS — R109 Unspecified abdominal pain: Secondary | ICD-10-CM | POA: Insufficient documentation

## 2011-09-24 LAB — URINALYSIS, ROUTINE W REFLEX MICROSCOPIC
Bilirubin Urine: NEGATIVE
Glucose, UA: NEGATIVE mg/dL
Hgb urine dipstick: NEGATIVE
Specific Gravity, Urine: 1.023 (ref 1.005–1.030)
pH: 6 (ref 5.0–8.0)

## 2011-09-24 LAB — HCG, QUANTITATIVE, PREGNANCY: hCG, Beta Chain, Quant, S: 22478 m[IU]/mL — ABNORMAL HIGH (ref <5)

## 2011-09-24 LAB — ABO/RH: ABO/RH(D): B POS

## 2011-09-24 LAB — PREGNANCY, URINE: Preg Test, Ur: POSITIVE — AB

## 2011-09-24 MED ORDER — PRENATAL RX 60-1 MG PO TABS: 1.0000 | ORAL_TABLET | Freq: Every day | ORAL | Status: DC

## 2011-09-24 NOTE — ED Notes (Signed)
Lab rejected both swabs from pelvic exam due to wooden swab being used on GC/Chlamydia and no saline in wet prep. Conley Rolls advised. Lab will not process samples.

## 2011-09-24 NOTE — Discharge Instructions (Signed)
Your vaginal ultrasound reveals a 6 week 5 day intrauterine pregnancy with a due date 05/14/2012

## 2011-09-24 NOTE — ED Provider Notes (Signed)
Medical screening examination/treatment/procedure(s) were performed by non-physician practitioner and as supervising physician I was immediately available for consultation/collaboration. Altariq Goodall, MD, FACEP   Juliani Laduke L Adin Laker, MD 09/24/11 2321 

## 2011-09-24 NOTE — ED Notes (Signed)
Pt presented to the ER with c/o abd pain, pt reports it started on Tuesday, 6/10, intermittent sharp and shooting pain, pt states she has nauseous at times, but denies any vomiting or diarrhea. Pt also denise taking any OTC medications at home for the s/s.

## 2011-09-24 NOTE — ED Provider Notes (Signed)
History     CSN: [id number]  Arrival date & time 09/24/11  4098   First MD Initiated Contact with Patient 09/24/11 2013      Chief Complaint  Patient presents with  . Abdominal Pain    (Consider location/radiation/quality/duration/timing/severity/associated sxs/prior treatment) HPI Comments: Patient states that her last menstrual period was January 3 showed unprotected intercourse with a condom failure.  On January 19.  She went immediately to the health Department, who gave her Plan B, which she took as directed.  She never started her cycle after that.  Now she is having bilateral lower abdominal pain without bleeding or discharge.  She has been nauseated for the last 2 weeks, thinking that she had a viral syndrome as one of her 8 children has had the same  Patient is a 31 y.o. female presenting with abdominal pain. The history is provided by the patient.  Abdominal Pain The primary symptoms of the illness include abdominal pain and nausea. The primary symptoms of the illness do not include fatigue, dysuria, vaginal discharge or vaginal bleeding. The current episode started more than 2 days ago. The onset of the illness was sudden. The problem has not changed since onset. The patient states that she believes she is currently pregnant. The patient has not had a change in bowel habit.    History reviewed. No pertinent past medical history.  History reviewed. No pertinent past surgical history.  History reviewed. No pertinent family history.  History  Substance Use Topics  . Smoking status: Never Smoker   . Smokeless tobacco: Not on file  . Alcohol Use: No    OB History    Grav Para Term Preterm Abortions TAB SAB Ect Mult Living                  Review of Systems  Constitutional: Negative for appetite change and fatigue.  Gastrointestinal: Positive for nausea and abdominal pain.  Genitourinary: Negative for dysuria, vaginal bleeding, vaginal discharge and vaginal pain.    Neurological: Negative for dizziness and weakness.    Allergies  Latex  Home Medications   Current Outpatient Rx  Name Route Sig Dispense Refill  . PRENATAL RX 60-1 MG PO TABS Oral Take 1 tablet by mouth daily. 90 tablet 0    BP 142/81  Pulse 76  Temp(Src) 98.7 F (37.1 C) (Oral)  Resp 15  Ht 5\' 5"  (1.651 m)  Wt 263 lb (119.296 kg)  BMI 43.77 kg/m2  SpO2 100%  LMP 08/06/2011  Physical Exam  Constitutional: She is oriented to person, place, and time. She appears well-developed and well-nourished.  HENT:  Head: Normocephalic.  Eyes: Pupils are equal, round, and reactive to light.  Neck: Normal range of motion.  Cardiovascular: Normal rate.   Pulmonary/Chest: Effort normal.  Abdominal: She exhibits no distension. There is no tenderness.  Genitourinary: Cervix exhibits discharge. Right adnexum displays tenderness and fullness.       Mucous discharge from the cervical os.  Right adnexal tenderness and fullness  Musculoskeletal: Normal range of motion.  Neurological: She is alert and oriented to person, place, and time.  Skin: Skin is warm and dry.    ED Course  Procedures (including critical care time)  Labs Reviewed  PREGNANCY, URINE - Abnormal; Notable for the following:    Preg Test, Ur POSITIVE (*)    All other components within normal limits  URINALYSIS, ROUTINE W REFLEX MICROSCOPIC  ABO/RH  GC/CHLAMYDIA PROBE AMP, GENITAL  RPR  WET PREP,  GENITAL  HCG, QUANTITATIVE, PREGNANCY   US Ob Comp Less 14 Wks  09/24/2011  *RADIOLOGY REPORT*  Clinical Data: Right lower quadrant abdominal pain.  7 weeks and 0 days pregnant by last menstrual period.  OBSTETRIC <14 WK [place] AND TRANSVAGINAL OB US  Technique:  Both transabdominal and transvaginal ultrasound examinations were performed for complete evaluation of the gestation as well as the maternal uterus, adnexal regions, and pelvic cul-de-sac.  Transvaginal technique was performed to assess early pregnancy.  Comparison:   Previous examinations.  Intrauterine gestational sac:  Visualized/normal in shape. Yolk sac: Visualized/normal in shape. Embryo: Visualized. Cardiac Activity: Visualized. Heart Rate: 130 bpm  CRL: 7.7   mm  6   w  5   d         [place] EDC: 05/14/2012  Maternal uterus/adnexae: Normal appearing maternal ovaries.  No subchorionic hemorrhage.  No free peritoneal fluid.  IMPRESSION: Single live intrauterine gestation with an estimated gestational age of [redacted] weeks and 5 days.  No complicating features.  Original Report Authenticated By: Darrol Angel, M.D.   US Ob Transvaginal  09/24/2011  *RADIOLOGY REPORT*  Clinical Data: Right lower quadrant abdominal pain.  7 weeks and 0 days pregnant by last menstrual period.  OBSTETRIC <14 WK [place] AND TRANSVAGINAL OB US  Technique:  Both transabdominal and transvaginal ultrasound examinations were performed for complete evaluation of the gestation as well as the maternal uterus, adnexal regions, and pelvic cul-de-sac.  Transvaginal technique was performed to assess early pregnancy.  Comparison:  Previous examinations.  Intrauterine gestational sac:  Visualized/normal in shape. Yolk sac: Visualized/normal in shape. Embryo: Visualized. Cardiac Activity: Visualized. Heart Rate: 130 bpm  CRL: 7.7   mm  6   w  5   d         [place] EDC: 05/14/2012  Maternal uterus/adnexae: Normal appearing maternal ovaries.  No subchorionic hemorrhage.  No free peritoneal fluid.  IMPRESSION: Single live intrauterine gestation with an estimated gestational age of [redacted] weeks and 5 days.  No complicating features.  Original Report Authenticated By: Darrol Angel, M.D.     1. Intrauterine normal pregnancy       MDM  Will perform pelvic exam.  Get CBC i-STAT, type and Rh quantitative beta and pelvic ultrasound to rule out ectopic        Arman Filter, NP 09/24/11 2036  Arman Filter, NP 09/24/11 2210

## 2011-09-25 LAB — RPR: RPR Ser Ql: NONREACTIVE

## 2011-10-03 ENCOUNTER — Encounter (HOSPITAL_COMMUNITY): Payer: Self-pay | Admitting: *Deleted

## 2011-10-03 ENCOUNTER — Inpatient Hospital Stay (HOSPITAL_COMMUNITY)
Admission: AD | Admit: 2011-10-03 | Discharge: 2011-10-04 | Disposition: A | Discharge status: Home / Self Care | Payer: Medicaid Other | Source: Ambulatory Visit | Attending: Obstetrics and Gynecology | Admitting: Obstetrics and Gynecology

## 2011-10-03 DIAGNOSIS — O4690 Antepartum hemorrhage, unspecified, unspecified trimester: Secondary | ICD-10-CM

## 2011-10-03 DIAGNOSIS — O99891 Other specified diseases and conditions complicating pregnancy: Secondary | ICD-10-CM | POA: Insufficient documentation

## 2011-10-03 DIAGNOSIS — R109 Unspecified abdominal pain: Secondary | ICD-10-CM | POA: Insufficient documentation

## 2011-10-03 DIAGNOSIS — N898 Other specified noninflammatory disorders of vagina: Secondary | ICD-10-CM | POA: Insufficient documentation

## 2011-10-03 LAB — URINALYSIS, ROUTINE W REFLEX MICROSCOPIC
Leukocytes, UA: NEGATIVE
Protein, ur: NEGATIVE mg/dL
Specific Gravity, Urine: 1.02 (ref 1.005–1.030)
Urobilinogen, UA: 1 mg/dL (ref 0.0–1.0)

## 2011-10-03 LAB — HCG, QUANTITATIVE, PREGNANCY: hCG, Beta Chain, Quant, S: 68521 m[IU]/mL — ABNORMAL HIGH (ref <5)

## 2011-10-03 LAB — CBC
MCH: 23.4 pg — ABNORMAL LOW (ref 26.0–34.0)
MCHC: 31.6 g/dL (ref 30.0–36.0)
Platelets: 226 10*3/uL (ref 150–400)
RDW: 18.8 % — ABNORMAL HIGH (ref 11.5–15.5)

## 2011-10-03 LAB — ABO/RH: ABO/RH(D): B POS

## 2011-10-03 NOTE — Progress Notes (Signed)
Pt states, " I've had brown spotting when I wiped all day today, and I've been cramping in low abdomen for a week, and low back pain for two days. I passed something that looked like tissue when I was washing myself."

## 2011-10-04 ENCOUNTER — Encounter (HOSPITAL_COMMUNITY): Payer: Self-pay | Admitting: *Deleted

## 2011-10-04 LAB — WET PREP, GENITAL
Trich, Wet Prep: NONE SEEN
Yeast Wet Prep HPF POC: NONE SEEN

## 2011-10-04 NOTE — ED Notes (Signed)
W. Muhammed CNM in to see pt 

## 2011-10-04 NOTE — ED Provider Notes (Signed)
History     Chief Complaint  Patient presents with  . Abdominal Cramping   HPI    Pt states, " I've had brown spotting when I wiped all day today, and I've been cramping in low abdomen for a week, and low back pain for two days. I passed something that looked like tissue when I was washing myself."      Past Medical History  Diagnosis Date  . No pertinent past medical history     Past Surgical History  Procedure Date  . No past surgeries     Family History  Problem Relation Age of Onset  . Adopted: Yes    History  Substance Use Topics  . Smoking status: Never Smoker   . Smokeless tobacco: Not on file  . Alcohol Use: No    Allergies:  Allergies  Allergen Reactions  . Latex     Prescriptions prior to admission  Medication Sig Dispense Refill  . acetaminophen (TYLENOL) 325 MG tablet Take 650 mg by mouth every 6 (six) hours as needed. For pain or headache      . Prenatal Vit-Fe Fumarate-FA (PRENATAL MULTIVITAMIN) 60-1 MG tablet Take 1 tablet by mouth daily.  90 tablet  0    Review of Systems  Gastrointestinal: Positive for abdominal pain (cramping).  All other systems reviewed and are negative.   Physical Exam   Blood pressure 113/67, pulse 73, temperature 99 F (37.2 C), temperature source Oral, resp. rate 20, height 5' 4.25" (1.632 m), weight 120.884 kg (266 lb 8 oz), last menstrual period 08/06/2011.  Physical Exam  Constitutional: She is oriented to person, place, and time. She appears well-developed and well-nourished. No distress.  HENT:  Head: Normocephalic.  Neck: Normal range of motion. Neck supple.  Cardiovascular: Normal rate, regular rhythm and normal heart sounds.   Respiratory: Effort normal and breath sounds normal. No respiratory distress.  GI: Soft. There is no tenderness.  Genitourinary: Vaginal discharge ( dark brown discharge) found.  Neurological: She is alert and oriented to person, place, and time.  Skin: Skin is warm and dry.    Bedside ultrasound - +FHTs  MAU Course  Procedures  Results for orders placed during the hospital encounter of 10/03/11 (from the past 24 hour(s))  URINALYSIS, ROUTINE W REFLEX MICROSCOPIC     Status: Normal   Collection Time   10/03/11 10:35 PM      Component Value Range   Color, Urine YELLOW  YELLOW    APPearance CLEAR  CLEAR    Specific Gravity, Urine 1.020  1.005 - 1.030    pH 6.0  5.0 - 8.0    Glucose, UA NEGATIVE  NEGATIVE (mg/dL)   Hgb urine dipstick NEGATIVE  NEGATIVE    Bilirubin Urine NEGATIVE  NEGATIVE    Ketones, ur NEGATIVE  NEGATIVE (mg/dL)   Protein, ur NEGATIVE  NEGATIVE (mg/dL)   Urobilinogen, UA 1.0  0.0 - 1.0 (mg/dL)   Nitrite NEGATIVE  NEGATIVE    Leukocytes, UA NEGATIVE  NEGATIVE   ABO/RH     Status: Normal   Collection Time   10/03/11 10:44 PM      Component Value Range   ABO/RH(D) B POS    CBC     Status: Abnormal   Collection Time   10/03/11 10:44 PM      Component Value Range   WBC 9.2  4.0 - 10.5 (K/uL)   RBC 4.32  3.87 - 5.11 (MIL/uL)   Hemoglobin 10.1 (*) 12.0 -  15.0 (g/dL)   HCT 16.1 (*) 09.6 - 46.0 (%)   MCV 74.1 (*) 78.0 - 100.0 (fL)   MCH 23.4 (*) 26.0 - 34.0 (pg)   MCHC 31.6  30.0 - 36.0 (g/dL)   RDW 04.5 (*) 40.9 - 15.5 (%)   Platelets 226  150 - 400 (K/uL)  HCG, QUANTITATIVE, PREGNANCY     Status: Abnormal   Collection Time   10/03/11 10:59 PM      Component Value Range   hCG, Beta Chain, Quant, [place] [address] (*) <5 (mIU/mL)  WET PREP, GENITAL     Status: Abnormal   Collection Time   10/04/11  1:30 AM      Component Value Range   Yeast Wet Prep HPF POC NONE SEEN  NONE SEEN    Trich, Wet Prep NONE SEEN  NONE SEEN    Clue Cells Wet Prep HPF POC NONE SEEN  NONE SEEN    WBC, Wet Prep HPF POC MODERATE (*) NONE SEEN      Assessment and Plan  Vaginal Discharge in Pregnancy  Plan: DC to home GC/CT pending Bleeding Precautions Begin prenatal care  [facility] 10/04/2011, 1:18 AM

## 2011-10-04 NOTE — ED Provider Notes (Signed)
Agree with above note.  Ray Gervasi 10/04/2011 6:50 AM   

## 2011-10-04 NOTE — Discharge Instructions (Signed)
Vaginal Bleeding During Pregnancy °A small amount of bleeding from the vagina can happen anytime during pregnancy. Be sure to tell your doctor about all vaginal bleeding.  °HOME CARE °· Get plenty of rest and sleep.  °· Count the number of pads you use each day. Do not use tampons.  °· Save any tissue you pass for your doctor to see.  °· Do not exercise  °· Do not do any heavy lifting.  °· Avoid going up and down stairs. If you must climb stairs, go slowly.  °· Do not have sex (intercourse) or orgasms until approved by your doctor.  °· Do not douche.  °· Only take medicine as told by your doctor. Do not take aspirin.  °· Eat healthy.  °· Always keep your follow-up appointments.  °GET HELP RIGHT AWAY IF:  °· You feel the baby moving less or not moving at all.  °· The bleeding gets worse.  °· You have very painful cramps or pain in your stomach or back.  °· You pass large clots or anything that looks like tissue.  °· You have a temperature by mouth above 102° F (38.9° C).  °· You feel very weak.  °· You have chills.  °· You feel dizzy or pass out (faint).  °· You have a gush of fluid from the vagina.  °MAKE SURE YOU:  °· Understand these instructions.  °· Will watch your condition.  °· Will get help right away if you are not doing well or get worse.  °Document Released: 04/28/2008 Document Revised: 04/01/2011 Document Reviewed: 06/25/2009 °[employer]® Patient Information ©2012 [employer]. °

## 2011-10-05 ENCOUNTER — Encounter (HOSPITAL_COMMUNITY): Payer: Self-pay | Admitting: *Deleted

## 2011-10-20 ENCOUNTER — Encounter (HOSPITAL_COMMUNITY): Payer: Self-pay | Admitting: *Deleted

## 2011-10-20 ENCOUNTER — Inpatient Hospital Stay (HOSPITAL_COMMUNITY)
Admission: AD | Admit: 2011-10-20 | Discharge: 2011-10-20 | Disposition: A | Discharge status: Home / Self Care | Payer: Medicaid Other | Source: Ambulatory Visit | Attending: Obstetrics and Gynecology | Admitting: Obstetrics and Gynecology

## 2011-10-20 DIAGNOSIS — O99891 Other specified diseases and conditions complicating pregnancy: Secondary | ICD-10-CM | POA: Insufficient documentation

## 2011-10-20 DIAGNOSIS — W010XXA Fall on same level from slipping, tripping and stumbling without subsequent striking against object, initial encounter: Secondary | ICD-10-CM | POA: Insufficient documentation

## 2011-10-20 DIAGNOSIS — R109 Unspecified abdominal pain: Secondary | ICD-10-CM

## 2011-10-20 DIAGNOSIS — O26899 Other specified pregnancy related conditions, unspecified trimester: Secondary | ICD-10-CM

## 2011-10-20 DIAGNOSIS — Y92009 Unspecified place in unspecified non-institutional (private) residence as the place of occurrence of the external cause: Secondary | ICD-10-CM | POA: Insufficient documentation

## 2011-10-20 LAB — URINALYSIS, ROUTINE W REFLEX MICROSCOPIC
Glucose, UA: NEGATIVE mg/dL
Hgb urine dipstick: NEGATIVE
Specific Gravity, Urine: 1.015 (ref 1.005–1.030)
Urobilinogen, UA: 0.2 mg/dL (ref 0.0–1.0)
pH: 6.5 (ref 5.0–8.0)

## 2011-10-20 NOTE — MAU Provider Note (Signed)
Agree with above note.  Sarah [name] 10/20/2011 12:50 PM    

## 2011-10-20 NOTE — Discharge Instructions (Signed)
Abdominal Pain During Pregnancy Abdominal discomfort is common in pregnancy. Most of the time, it does not cause harm. There are many causes of abdominal pain. Some causes are more serious than others. Some of the causes of abdominal pain in pregnancy are easily diagnosed. Occasionally, the diagnosis takes time to understand. Other times, the cause is not determined. Abdominal pain can be a sign that something is very wrong with the pregnancy, or the pain may have nothing to do with the pregnancy at all. For this reason, always tell your caregiver if you have any abdominal discomfort. CAUSES Common and harmless causes of abdominal pain include:  Constipation.   Excess gas and bloating.   Round ligament pain. This is pain that is felt in the folds of the groin.   The position the baby or placenta is in.   Baby kicks.   Braxton-Hicks contractions. These are mild contractions that do not cause cervical dilation.  Serious causes of abdominal pain include:  Ectopic pregnancy. This happens when a fertilized egg implants outside of the uterus.   Miscarriage.   Preterm labor. This is when labor starts at less than 37 weeks of pregnancy.   Placental abruption. This is when the placenta partially or completely separates from the uterus.   Preeclampsia. This is often associated with high blood pressure and has been referred to as "toxemia in pregnancy."   Uterine or amniotic fluid infections.  Causes unrelated to pregnancy include:  Urinary tract infection.   Gallbladder stones or inflammation.   Hepatitis or other liver illness.   Intestinal problems, stomach flu, food poisoning, or ulcer.   Appendicitis.   Kidney (renal) stones.   Kidney infection (pylonephritis).  HOME CARE INSTRUCTIONS  For mild pain:  Do not have sexual intercourse or put anything in your vagina until your symptoms go away completely.   Get plenty of rest until your pain improves. If your pain does not  improve in 1 hour, call your caregiver.   Drink clear fluids if you feel nauseous. Avoid solid food as long as you are uncomfortable or nauseous.   Only take medicine as directed by your caregiver.   Keep all follow-up appointments with your caregiver.  SEEK IMMEDIATE MEDICAL CARE IF:  You are bleeding, leaking fluid, or passing tissue from the vagina.   You have increasing pain or cramping.   You have persistent vomiting.   You have painful or bloody urination.   You have a fever.   You notice a decrease in your baby's movements.   You have extreme weakness or feel faint.   You have shortness of breath, with or without abdominal pain.   You develop a severe headache with abdominal pain.   You have abnormal vaginal discharge with abdominal pain.   You have persistent diarrhea.   You have abdominal pain that continues even after rest, or gets worse.  MAKE SURE YOU:   Understand these instructions.   Will watch your condition.   Will get help right away if you are not doing well or get worse.  Document Released: 07/20/2005 Document Revised: 07/09/2011 Document Reviewed: 02/13/2011 [facility] Patient Information 2012 [place], [place].Prenatal Care [facility] OB[facility]N    [facility]  [phone]     Phone: [phone]          [facility]                      [facility] [place]  [facility]     Phone: [phone]  Phone: [phone]         [facility]     Phone: [phone]  Phone: [phone]           [facility] [place]                hone: [phone]  Phone: [phone]         [facility] [address] Francoise Ceo      Phone: [phone]  Phone: [phone]         [facility]                Phone: [phone]  Women's Health   Phone:[phone]    Family [address]  [place])          Phone: [phone] [facility]   Phone: [phone]

## 2011-10-20 NOTE — MAU Note (Signed)
Patient states that on 3-13 she slipped on water in the bathroom and fell and hit her lower abdomen on the side of the tub. States she started having lower abdominal pain then but was waiting for her Medicaid card so she could go see the MD, but the pain is getting worse. Has some off and on brown spotting but not increased after the fall. Nausea and vomiting off and on for about one week.

## 2011-10-20 NOTE — MAU Provider Note (Signed)
Chief Complaint:  Fall and Abdominal Pain    First Provider Initiated Contact with Patient 10/20/11 1151      Sarah [name] is  31 y.o. [id number].  Patient's last menstrual period was 08/06/2011.. [redacted]w[redacted]d   She presents complaining of Fall and Abdominal Pain  Pt presents for evaluation of persistent lower abd cramping since falling 1 week ago. Describes as mild menstrual-like cramps, lessened with tylenol. Reports occasional brown discharge since MAU visit on 10/03/11 that confirmed pregnancy. States has not received MCD card yet and want to be sure pregnancy still continuing normally. Denies nausea, vomiting, diarrhea, vaginal bleeding, fever, chills or dysuria.  Obstetrical/Gynecological History: OB History    Grav Para Term Preterm Abortions TAB SAB Ect Mult Living   8 6 6  1  1  2 8       Past Medical History: Past Medical History  Diagnosis Date  . No pertinent past medical history     Past Surgical History: Past Surgical History  Procedure Date  . No past surgeries     Family History: Family History  Problem Relation Age of Onset  . Adopted: Yes    Social History: History  Substance Use Topics  . Smoking status: Never Smoker   . Smokeless tobacco: Not on file  . Alcohol Use: No    Allergies:  Allergies  Allergen Reactions  . Latex Anaphylaxis and Hives    Prescriptions prior to admission  Medication Sig Dispense Refill  . acetaminophen (TYLENOL) 325 MG tablet Take 650 mg by mouth every 6 (six) hours as needed. For pain or headache      . Prenatal Vit-Fe Fumarate-FA (PRENATAL MULTIVITAMIN) 60-1 MG tablet Take 1 tablet by mouth daily.  90 tablet  0    Review of Systems - Negative except what has been reviewed in the HPI  Physical Exam   Blood pressure 116/66, pulse 68, temperature 98.9 F (37.2 C), temperature source Oral, resp. rate 20, height 5\' 4"  (1.626 m), weight 121.564 kg (268 lb), last menstrual period 08/06/2011, SpO2 100.00%.  General: General  appearance - alert, well appearing, and in no distress, oriented to person, place, and time and overweight Mental status - alert, oriented to person, place, and time, normal mood, behavior, speech, dress, motor activity, and thought processes, affect appropriate to mood, comfortable appearing Abdomen - soft, nontender, nondistended, no masses or organomegaly obese Focused Gynecological Exam: normal external genitalia, vulva, vagina, cervix, uterus and adnexa, exam declined by the patient, cervix: closed/long/thick, no discharge or bleeding noted on glove with exam.  Labs: Recent Results (from the past 24 hour(s))  URINALYSIS, ROUTINE W REFLEX MICROSCOPIC   Collection Time   10/20/11 11:20 AM      Component Value Range   Color, Urine YELLOW  YELLOW    APPearance CLEAR  CLEAR    Specific Gravity, Urine 1.015  1.005 - 1.030    pH 6.5  5.0 - 8.0    Glucose, UA NEGATIVE  NEGATIVE (mg/dL)   Hgb urine dipstick NEGATIVE  NEGATIVE    Bilirubin Urine NEGATIVE  NEGATIVE    Ketones, ur NEGATIVE  NEGATIVE (mg/dL)   Protein, ur NEGATIVE  NEGATIVE (mg/dL)   Urobilinogen, UA 0.2  0.0 - 1.0 (mg/dL)   Nitrite NEGATIVE  NEGATIVE    Leukocytes, UA NEGATIVE  NEGATIVE    Imaging Studies:  US Ob Comp Less 14 Wks  09/24/2011  *RADIOLOGY REPORT*  Clinical Data: Right lower quadrant abdominal pain.  7 weeks and 0  days pregnant by last menstrual period.  OBSTETRIC <14 WK [place] AND TRANSVAGINAL OB US  Technique:  Both transabdominal and transvaginal ultrasound examinations were performed for complete evaluation of the gestation as well as the maternal uterus, adnexal regions, and pelvic cul-de-sac.  Transvaginal technique was performed to assess early pregnancy.  Comparison:  Previous examinations.  Intrauterine gestational sac:  Visualized/normal in shape. Yolk sac: Visualized/normal in shape. Embryo: Visualized. Cardiac Activity: Visualized. Heart Rate: 130 bpm  CRL: 7.7   mm  6   w  5   d         [place] EDC: 05/14/2012   Maternal uterus/adnexae: Normal appearing maternal ovaries.  No subchorionic hemorrhage.  No free peritoneal fluid.  IMPRESSION: Single live intrauterine gestation with an estimated gestational age of [redacted] weeks and 5 days.  No complicating features.  Original Report Authenticated By: Darrol Angel, M.D.   Informal bedside repeated today. +cardiac activity, + fetal movement.  Assessment: Abd pain in pregnancy  Plan: Discharge home Comfort measures and precautions reviewed FU with OB/Gyn provider of choice to begin [facility], referral list given  Kohen Reither E. 10/20/2011,12:01 PM

## 2011-11-04 ENCOUNTER — Inpatient Hospital Stay (HOSPITAL_COMMUNITY)
Admission: AD | Admit: 2011-11-04 | Discharge: 2011-11-05 | Disposition: A | Discharge status: Home / Self Care | Payer: Medicaid Other | Source: Ambulatory Visit | Attending: Obstetrics & Gynecology | Admitting: Obstetrics & Gynecology

## 2011-11-04 ENCOUNTER — Encounter (HOSPITAL_COMMUNITY): Payer: Self-pay | Admitting: *Deleted

## 2011-11-04 DIAGNOSIS — A084 Viral intestinal infection, unspecified: Secondary | ICD-10-CM

## 2011-11-04 DIAGNOSIS — A088 Other specified intestinal infections: Secondary | ICD-10-CM | POA: Insufficient documentation

## 2011-11-04 DIAGNOSIS — O99891 Other specified diseases and conditions complicating pregnancy: Secondary | ICD-10-CM | POA: Insufficient documentation

## 2011-11-04 DIAGNOSIS — R197 Diarrhea, unspecified: Secondary | ICD-10-CM | POA: Insufficient documentation

## 2011-11-04 DIAGNOSIS — O210 Mild hyperemesis gravidarum: Secondary | ICD-10-CM | POA: Insufficient documentation

## 2011-11-04 DIAGNOSIS — R1110 Vomiting, unspecified: Secondary | ICD-10-CM

## 2011-11-04 LAB — URINALYSIS, ROUTINE W REFLEX MICROSCOPIC
Glucose, UA: NEGATIVE mg/dL
Leukocytes, UA: NEGATIVE
Protein, ur: NEGATIVE mg/dL
Specific Gravity, Urine: >1.03 — ABNORMAL HIGH (ref 1.005–1.030)
pH: 6 (ref 5.0–8.0)

## 2011-11-04 MED ORDER — LACTATED RINGERS IV BOLUS (SEPSIS)
1000.0000 mL | Freq: Once | INTRAVENOUS | Status: AC
  Administered 2011-11-05: 1000 mL via INTRAVENOUS

## 2011-11-04 MED ORDER — ONDANSETRON HCL 4 MG PO TABS: 8.0000 mg | ORAL_TABLET | Freq: Three times a day (TID) | ORAL | Status: DC | PRN

## 2011-11-04 MED ORDER — ONDANSETRON HCL 4 MG/2ML IJ SOLN
4.0000 mg | INTRAMUSCULAR | Status: AC
  Administered 2011-11-05: 4 mg via INTRAVENOUS
  Filled 2011-11-04: qty 2

## 2011-11-04 NOTE — MAU Note (Signed)
Pt reports she has vomiting x 4 days (every time she drinks something), diarrhea today x 6. Lower abd pain x 2 days. Denies bleeding.

## 2011-11-04 NOTE — ED Provider Notes (Signed)
History     CSN: [id number]  Arrival date and time: 11/04/11 2148   First Provider Initiated Contact with Patient 11/04/11 2319      Chief Complaint  Patient presents with  . Emesis   Patient is a 31 y.o. female presenting with vomiting.  Emesis  Associated symptoms include diarrhea. Pertinent negatives include no fever.  Emesis  Associated symptoms include diarrhea. Pertinent negatives include no fever.  HPI:  Ms. Annaka Cleaver is a 31 yr old [id number] at 12 wks who presents to the MAU with a CC of vomiting and diarrhea. She states her symptoms of vomiting,nausea, fever, diarrhea, and subsequent HA began approximately 5 days ago. Pt states she vomited 3x times and had 6 episodes of diarrhea. She reports her children with similar symptoms being diagnosed "stomach virus." She reports her appetite and fluid intake decreasing due to the nausea. Denies leakage of fluid, vaginal bleeding, vaginal itching/discharge, urinary symptoms, fever, and chills.   OB History    Grav Para Term Preterm Abortions TAB SAB Ect Mult Living   8 6 6  1  1  2 8       Past Medical History  Diagnosis Date  . No pertinent past medical history     Past Surgical History  Procedure Date  . No past surgeries     Family History  Problem Relation Age of Onset  . Adopted: Yes    History  Substance Use Topics  . Smoking status: Never Smoker   . Smokeless tobacco: Not on file  . Alcohol Use: No    Allergies:  Allergies  Allergen Reactions  . Latex Anaphylaxis and Hives  . Mushroom Extract Complex Anaphylaxis    Prescriptions prior to admission  Medication Sig Dispense Refill  . acetaminophen (TYLENOL) 325 MG tablet Take 650 mg by mouth every 6 (six) hours as needed. For pain or headache      . Prenatal Vit-Fe Fumarate-FA (PRENATAL MULTIVITAMIN) 60-1 MG tablet Take 1 tablet by mouth daily.  90 tablet  0    Review of Systems  Constitutional: Negative for fever.  Gastrointestinal: Positive for  nausea, vomiting and diarrhea.  Neurological: Negative for weakness.  All other systems reviewed and are negative.   Physical Exam  BP 114/52  Pulse 71  Temp(Src) 98.4 F (36.9 C) (Oral)  Resp 20  LMP 08/06/2011  Physical Exam  Vitals reviewed. Constitutional: She is oriented to person, place, and time. She appears well-developed and well-nourished. No distress.  HENT:       MMM.   Neck: Normal range of motion. Neck supple.  Cardiovascular: Normal rate, regular rhythm, normal heart sounds and intact distal pulses.   No murmur heard. Respiratory: Effort normal and breath sounds normal. No respiratory distress. She has no wheezes. She has no rales. She exhibits no tenderness.  GI: Soft. Bowel sounds are normal. She exhibits no distension and no mass. There is no tenderness. There is no rebound and no guarding.  Musculoskeletal: Normal range of motion. She exhibits no edema.  Neurological: She is alert and oriented to person, place, and time.  Skin: Skin is warm and dry. She is not diaphoretic.  Psychiatric: She has a normal mood and affect.    MAU Course  Procedures   Assessment and Plan  1. IUP: [redacted]w[redacted]d 2. GI Virus: Zofran and IV LRs and IV Phenergan given to help alleviate symptoms during stay. Plan is to send prescriptions of Zofran and phenergan home with her. Pt agreed to  return if symptoms worsen.   Iverson Alamin 11/04/2011, 11:24 PM   Note and plan discussed with Sharen Counter, CNM.   I have seen this patient and agree with the above PA student's note.  LEFTWICH-KIRBY, Selmer Adduci Certified Nurse-Midwife

## 2011-11-05 LAB — OB RESULTS CONSOLE RUBELLA ANTIBODY, IGM: Rubella: IMMUNE

## 2011-11-05 MED ORDER — ONDANSETRON HCL 4 MG PO TABS: 4.0000 mg | ORAL_TABLET | Freq: Three times a day (TID) | ORAL | Status: AC | PRN

## 2011-11-05 MED ORDER — PROMETHAZINE HCL 25 MG PO TABS: 25.0000 mg | ORAL_TABLET | Freq: Four times a day (QID) | ORAL | Status: DC | PRN

## 2011-11-05 MED ORDER — PROMETHAZINE HCL 25 MG/ML IJ SOLN
25.0000 mg | INTRAMUSCULAR | Status: AC
  Administered 2011-11-05: 25 mg via INTRAVENOUS
  Filled 2011-11-05: qty 1

## 2011-11-05 NOTE — Discharge Instructions (Signed)
Viral Gastroenteritis Viral gastroenteritis is also known as stomach flu. This condition affects the stomach and intestinal tract. It can cause sudden diarrhea and vomiting. The illness typically lasts 3 to 8 days. Most people develop an immune response that eventually gets rid of the virus. While this natural response develops, the virus can make you quite ill. CAUSES  Many different viruses can cause gastroenteritis, such as rotavirus or noroviruses. You can catch one of these viruses by consuming contaminated food or water. You may also catch a virus by sharing utensils or other personal items with an infected person or by touching a contaminated surface. SYMPTOMS  The most common symptoms are diarrhea and vomiting. These problems can cause a severe loss of body fluids (dehydration) and a body salt (electrolyte) imbalance. Other symptoms may include:  Fever.   Headache.   Fatigue.   Abdominal pain.  DIAGNOSIS  Your caregiver can usually diagnose viral gastroenteritis based on your symptoms and a physical exam. A stool sample may also be taken to test for the presence of viruses or other infections. TREATMENT  This illness typically goes away on its own. Treatments are aimed at rehydration. The most serious cases of viral gastroenteritis involve vomiting so severely that you are not able to keep fluids down. In these cases, fluids must be given through an intravenous line (IV). HOME CARE INSTRUCTIONS   Drink enough fluids to keep your urine clear or pale yellow. Drink small amounts of fluids frequently and increase the amounts as tolerated.   Ask your caregiver for specific rehydration instructions.   Avoid:   Foods high in sugar.   Alcohol.   Carbonated drinks.   Tobacco.   Juice.   Caffeine drinks.   Extremely hot or cold fluids.   Fatty, greasy foods.   Too much intake of anything at one time.   Dairy products until 24 to 48 hours after diarrhea stops.   You may  consume probiotics. Probiotics are active cultures of beneficial bacteria. They may lessen the amount and number of diarrheal stools in adults. Probiotics can be found in yogurt with active cultures and in supplements.   Wash your hands well to avoid spreading the virus.   Only take over-the-counter or prescription medicines for pain, discomfort, or fever as directed by your caregiver. Do not give aspirin to children. Antidiarrheal medicines are not recommended.   Ask your caregiver if you should continue to take your regular prescribed and over-the-counter medicines.   Keep all follow-up appointments as directed by your caregiver.  SEEK IMMEDIATE MEDICAL CARE IF:   You are unable to keep fluids down.   You do not urinate at least once every 6 to 8 hours.   You develop shortness of breath.   You notice blood in your stool or vomit. This may look like coffee grounds.   You have abdominal pain that increases or is concentrated in one small area (localized).   You have persistent vomiting or diarrhea.   You have a fever.   The patient is a child younger than 3 months, and he or she has a fever.   The patient is a child older than 3 months, and he or she has a fever and persistent symptoms.   The patient is a child older than 3 months, and he or she has a fever and symptoms suddenly get worse.   The patient is a baby, and he or she has no tears when crying.  MAKE SURE YOU:     Understand these instructions.   Will watch your condition.   Will get help right away if you are not doing well or get worse.  Document Released: 07/20/2005 Document Revised: 07/09/2011 Document Reviewed: 05/06/2011 [employer] Patient Information 2012 [employer]. 

## 2011-11-28 ENCOUNTER — Encounter (HOSPITAL_COMMUNITY): Payer: Self-pay | Admitting: *Deleted

## 2011-11-28 ENCOUNTER — Inpatient Hospital Stay (HOSPITAL_COMMUNITY)
Admission: AD | Admit: 2011-11-28 | Discharge: 2011-11-28 | Disposition: A | Discharge status: Home / Self Care | Payer: Medicaid Other | Source: Ambulatory Visit | Attending: Obstetrics | Admitting: Obstetrics

## 2011-11-28 DIAGNOSIS — R109 Unspecified abdominal pain: Secondary | ICD-10-CM | POA: Insufficient documentation

## 2011-11-28 DIAGNOSIS — Y9210 Unspecified residential institution as the place of occurrence of the external cause: Secondary | ICD-10-CM | POA: Insufficient documentation

## 2011-11-28 DIAGNOSIS — O99891 Other specified diseases and conditions complicating pregnancy: Secondary | ICD-10-CM | POA: Insufficient documentation

## 2011-11-28 DIAGNOSIS — W108XXA Fall (on) (from) other stairs and steps, initial encounter: Secondary | ICD-10-CM | POA: Insufficient documentation

## 2011-11-28 DIAGNOSIS — R1084 Generalized abdominal pain: Secondary | ICD-10-CM

## 2011-11-28 NOTE — Discharge Instructions (Signed)
Injuries In Pregnancy  Trauma is the most common cause of injury and death in pregnant women. The most common cause of death to the fetus is injury and death of the pregnant mother.   Minor falls and minor automobile accidents do not usually harm the fetus. The fetus is protected in the womb by a sac filled with fluid. The fetus can be harmed if there is direct trauma to your abdomen and pelvis. Direct trauma to the uterus and placenta can affect the blood supply to the fetus. Major trauma causing significant bleeding and shock to the mother can also compromise and jeopardize the fetal blood supply.  It is important to know your blood type and the father's blood type in case you develop vaginal bleeding. If you are RH negative and have sustained serious trauma or develop vaginal bleeding, you will need to have medicine (RhoGAM [Rh immune globulin]) to avoid Rh problems in future pregnancies.  CAUSES   Falls are more common in the second and third trimester of the pregnancy. Factors that increase your risk of falling include:   Increase in weight.   The change of your center of gravity.   Tripping over an object that cannot be seen.   Automobile accidents. It is important to wear a seat belt and always practice safe driving.   Domestic violence or assault. Dial your local emergency services (911 in the [place]). Spousal abuse can be a significant cause of trauma during pregnancy.   Burns (fire or electrical). Avoid fires, starting fires, lifting heavy pots of boiling or hot liquids, and fixing electrical problems.  The most common causes of death to the pregnant woman include:   Injuries that cause severe bleeding, shock and loss of blood flow to the mother's major organs.   Head and neck injuries that result in severe brain or spinal damage.   Chest trauma that can cause direct injury to the heart and lungs or any injury that effects the area enclosed by the ribs (thorax). Trauma to this area can result in  cardio-respiratory arrest.  Symptoms and treatment will depend on the type of injury.  HOME CARE INSTRUCTIONS    Call your caregiver if you are in a car accident, even if you think you and the baby are not hurt. Your caregiver may want you to have a precautionary evaluation.   Do not take aspirin. It can worsen bleeding.   You may apply cold packs 3 to 4 times a day to the injury with your caregiver's permission.   After 24 hours apply warm compresses to the injured site with your caregiver's permission.   Call your caregiver if you are having increasing pain in any part of your body that is not remedied by your instructed home care.   In a severe injury, try to have someone be with you and help you until you are able to take care of yourself.   Do not wear high heel shoes while pregnant.   Remove slippery rugs and loose objects on the floor.  SEEK IMMEDIATE MEDICAL CARE IF:    You have been assaulted (domestic or otherwise).   You have been in a car accident.   You develop vaginal bleeding.   You develop fluid leaking from the vagina.   You develop uterine contractions (pelvic cramping or pain).   You develop neck stiffness or pain.   You become weak or faint, or have uncontrolled vomiting after trauma.   You had a serious burn.   This includes burns to the face, neck, hands or genitals, or burns greater than the size of your palm anywhere else.   You develop a headache or vision problems after a fall or from other trauma.   You do not feel the baby moving or the baby is not moving as much as before.  Document Released: 08/27/2004 Document Revised: 07/09/2011 Document Reviewed: 12/02/2009  [employer] Patient Information 2012 [employer].

## 2011-11-28 NOTE — MAU Note (Signed)
Pt states, " I tripped over cord a church and hit my belly on a step and another person fell onto of me. I've had sharp pain in my upper abdomen for two and a half weeks which is slightly worse now."

## 2011-11-28 NOTE — MAU Provider Note (Signed)
History     CSN: [id number]  Arrival date and time: 11/28/11 1557   First Provider Initiated Contact with Patient 11/28/11 1645      Chief Complaint  Patient presents with  . Fall  . Abdominal Pain   HPIShanetta A [name] [name] is 31 y.o. [id number] [redacted]w[redacted]d weeks presenting with report of falling today at church at noon.  Patient of Dr. Elsie Stain. Tripped on microphone cord.  Larey Seat on a step hitting her abdomen, then a "large " person fell on her.  Reports abdominal pain.  Denies vaginal bleeding or leaking of fluid.  Took one tylenol #3 a few minutes after the fall.  Church member insisted on her being checked out.   States her pain at this time is a 3/10.    Past Medical History  Diagnosis Date  . No pertinent past medical history     Past Surgical History  Procedure Date  . Dilation and curettage of uterus     Family History  Problem Relation Age of Onset  . Adopted: Yes  . Anesthesia problems Neg Hx   . Hypotension Neg Hx   . Malignant hyperthermia Neg Hx   . Pseudochol deficiency Neg Hx     History  Substance Use Topics  . Smoking status: Never Smoker   . Smokeless tobacco: Not on file  . Alcohol Use: No    Allergies:  Allergies  Allergen Reactions  . Latex Anaphylaxis and Hives  . Mushroom Extract Complex Anaphylaxis    Prescriptions prior to admission  Medication Sig Dispense Refill  . acetaminophen (TYLENOL) 325 MG tablet Take 650 mg by mouth every 6 (six) hours as needed. For pain or headache      . Prenatal Vit-Fe Fumarate-FA (PRENATAL MULTIVITAMIN) 60-1 MG tablet Take 1 tablet by mouth daily.  90 tablet  0  . promethazine (PHENERGAN) 25 MG tablet Take 1 tablet (25 mg total) by mouth every 6 (six) hours as needed for nausea.  30 tablet  0    Review of Systems  Constitutional: Negative.   Gastrointestinal: Positive for abdominal pain.  Genitourinary:       Negative for vaginal bleeding or loss of fluid  Musculoskeletal: Negative.   Psychiatric/Behavioral:  The patient is nervous/anxious.    Physical Exam   Blood pressure 113/45, pulse 78, temperature 98 F (36.7 C), temperature source Oral, resp. rate 20, height 5\' 4"  (1.626 m), weight 119.296 kg (263 lb), last menstrual period 08/06/2011.  Physical Exam  Constitutional: She is oriented to person, place, and time. She appears well-developed and well-nourished. No distress.  HENT:  Head: Normocephalic.  Neck: Normal range of motion.  Cardiovascular: Normal rate.   Respiratory: Effort normal.  GI: Soft. She exhibits no mass. There is no tenderness. There is no rebound and no guarding.  Genitourinary:       Not indicated  Neurological: She is alert and oriented to person, place, and time.  Skin: Skin is warm and dry.  Psychiatric: She has a normal mood and affect. Her behavior is normal.    MAU Course  Procedures  MDM 16:50  Reported to Dr. Clearance Coots who is on call for Dr. Gaynell Face patient's chief complaint, non tender abdominal exam and + fetal heart tones of 152.  Order given to reassure patient and discharge to home.  Patient has pain medication at home  Assessment and Plan  A:  Fall      16w gestation with + fetal heart tones dopplered  P:  May  take tylenol #3 that she has at home for discomfort      Keep appt for 4/7 with Dr. Gaynell Face.  Call if sxs worsen  Ladon Vandenberghe,EVE M 11/28/2011, 4:47 PM

## 2011-12-11 ENCOUNTER — Other Ambulatory Visit (HOSPITAL_COMMUNITY): Payer: Self-pay | Admitting: Obstetrics

## 2011-12-11 DIAGNOSIS — Z0489 Encounter for examination and observation for other specified reasons: Secondary | ICD-10-CM

## 2011-12-16 ENCOUNTER — Ambulatory Visit (HOSPITAL_COMMUNITY)
Admission: RE | Admit: 2011-12-16 | Discharge: 2011-12-16 | Disposition: A | Discharge status: Home / Self Care | Payer: Medicaid Other | Source: Ambulatory Visit | Attending: Obstetrics | Admitting: Obstetrics

## 2011-12-16 DIAGNOSIS — Z0489 Encounter for examination and observation for other specified reasons: Secondary | ICD-10-CM

## 2011-12-16 DIAGNOSIS — E669 Obesity, unspecified: Secondary | ICD-10-CM | POA: Insufficient documentation

## 2011-12-16 DIAGNOSIS — Z1389 Encounter for screening for other disorder: Secondary | ICD-10-CM | POA: Insufficient documentation

## 2011-12-16 DIAGNOSIS — Z363 Encounter for antenatal screening for malformations: Secondary | ICD-10-CM | POA: Insufficient documentation

## 2011-12-16 DIAGNOSIS — O99210 Obesity complicating pregnancy, unspecified trimester: Secondary | ICD-10-CM | POA: Insufficient documentation

## 2011-12-16 DIAGNOSIS — O358XX0 Maternal care for other (suspected) fetal abnormality and damage, not applicable or unspecified: Secondary | ICD-10-CM | POA: Insufficient documentation

## 2011-12-16 DIAGNOSIS — O09299 Supervision of pregnancy with other poor reproductive or obstetric history, unspecified trimester: Secondary | ICD-10-CM | POA: Insufficient documentation

## 2011-12-23 ENCOUNTER — Encounter (HOSPITAL_COMMUNITY): Payer: Self-pay | Admitting: *Deleted

## 2011-12-23 ENCOUNTER — Inpatient Hospital Stay (HOSPITAL_COMMUNITY)
Admission: AD | Admit: 2011-12-23 | Discharge: 2011-12-23 | Disposition: A | Discharge status: Home / Self Care | Payer: Medicaid Other | Source: Ambulatory Visit | Attending: Obstetrics | Admitting: Obstetrics

## 2011-12-23 DIAGNOSIS — K219 Gastro-esophageal reflux disease without esophagitis: Secondary | ICD-10-CM

## 2011-12-23 DIAGNOSIS — O26899 Other specified pregnancy related conditions, unspecified trimester: Secondary | ICD-10-CM

## 2011-12-23 DIAGNOSIS — O99891 Other specified diseases and conditions complicating pregnancy: Secondary | ICD-10-CM | POA: Insufficient documentation

## 2011-12-23 DIAGNOSIS — R1013 Epigastric pain: Secondary | ICD-10-CM | POA: Insufficient documentation

## 2011-12-23 HISTORY — DX: Major depressive disorder, single episode, unspecified: F32.9

## 2011-12-23 HISTORY — DX: Depression, unspecified: F32.A

## 2011-12-23 LAB — URINALYSIS, ROUTINE W REFLEX MICROSCOPIC
Ketones, ur: NEGATIVE mg/dL
Leukocytes, UA: NEGATIVE
Nitrite: NEGATIVE
pH: 6 (ref 5.0–8.0)

## 2011-12-23 MED ORDER — RANITIDINE HCL 150 MG PO TABS: 150.0000 mg | ORAL_TABLET | Freq: Two times a day (BID) | ORAL | Status: DC

## 2011-12-23 NOTE — Discharge Instructions (Signed)
Heartburn During Pregnancy  Heartburn is a burning sensation in the chest caused by stomach acid backing up into the esophagus. Heartburn (also known as "reflux") is common in pregnancy because a certain hormone (progesterone) changes. The progesterone hormone may relax the valve that separates the esophagus from the stomach. This allows acid to go up into the esophagus, causing heartburn. Heartburn may also happen in pregnancy because the enlarging uterus pushes up on the stomach, which pushes more acid into the esophagus. This is especially true in the later stages of pregnancy. Heartburn problems usually go away after giving birth. CAUSES   The progesterone hormone.   Changing hormone levels.   The growing uterus that pushes stomach acid upward.   Large meals.   Certain foods and drinks.   Exercise.   Increased acid production.  SYMPTOMS   Burning pain in the chest or lower throat.   Bitter taste in the mouth.   Coughing.  DIAGNOSIS  Heartburn is typically diagnosed by your caregiver when taking a careful history of your concern. Your caregiver may order a blood test to check for a certain type of bacteria that is associated with heartburn. Sometimes, heartburn is diagnosed by prescribing a heartburn medicine to see if the symptoms improve. It is rare in pregnancy to have a procedure called an endoscopy. This is when a tube with a light and a camera on the end is used to examine the esophagus and the stomach. TREATMENT   Your caregiver may tell you to use certain over-the-counter medicines (antacids, acid reducers) for mild heartburn.   Your caregiver may prescribe medicines to decrease stomach acid or to protect your stomach lining.   Your caregiver may recommend certain diet changes.   For severe cases, your caregiver may recommend that the head of the bed be elevated on blocks. (Sleeping with more pillows is not an effective treatment as it only changes the position of your  head and does not improve the main problem of stomach acid refluxing into the esophagus.)  HOME CARE INSTRUCTIONS   Take all medicines as directed by your caregiver.   Raise the head of your bed by putting blocks under the legs if instructed to by your caregiver.   Do not exercise right after eating.   Avoid eating 2 or 3 hours before bed. Do not lie down right after eating.   Eat small meals throughout the day instead of 3 large meals.   Identify foods and beverages that make your symptoms worse and avoid them. Foods you may want to avoid include:   Peppers.   Chocolate.   High-fat foods, including fried foods.   Spicy foods.   Garlic and onions.   Citrus fruits, including oranges, grapefruit, lemons, and limes.   Food containing tomatoes or tomato products.   Mint.   Carbonated and caffeinated drinks.   Vinegar.  SEEK IMMEDIATE MEDICAL CARE IF:   You have severe chest pain that goes down your arm or into your jaw or neck.   You feel sweaty, dizzy, or lightheaded.   You become short of breath.   You vomit blood.   You have difficulty or pain with swallowing.   You have bloody or black, tarry stools.   You have episodes of heartburn more than 3 times a week, for more than 2 weeks.  MAKE SURE YOU:  Understand these instructions.   Will watch your condition.   Will get help right away if you are not doing well or   get worse.  Document Released: 07/17/2000 Document Revised: 07/09/2011 Document Reviewed: 01/08/2011 [facility] Patient Information 2012 [place], [place].  Gastroesophageal Reflux Disease, Adult Gastroesophageal reflux disease (GERD) happens when acid from your stomach flows up into the esophagus. When acid comes in contact with the esophagus, the acid causes soreness (inflammation) in the esophagus. Over time, GERD may create small holes (ulcers) in the lining of the esophagus. CAUSES   Increased body weight. This puts pressure on the stomach,  making acid rise from the stomach into the esophagus.   Smoking. This increases acid production in the stomach.   Drinking alcohol. This causes decreased pressure in the lower esophageal sphincter (valve or ring of muscle between the esophagus and stomach), allowing acid from the stomach into the esophagus.   Late evening meals and a full stomach. This increases pressure and acid production in the stomach.   A malformed lower esophageal sphincter.  Sometimes, no cause is found. SYMPTOMS   Burning pain in the lower part of the mid-chest behind the breastbone and in the mid-stomach area. This may occur twice a week or more often.   Trouble swallowing.   Sore throat.   Dry cough.   Asthma-like symptoms including chest tightness, shortness of breath, or wheezing.  DIAGNOSIS  Your caregiver may be able to diagnose GERD based on your symptoms. In some cases, X-rays and other tests may be done to check for complications or to check the condition of your stomach and esophagus. TREATMENT  Your caregiver may recommend over-the-counter or prescription medicines to help decrease acid production. Ask your caregiver before starting or adding any new medicines.  HOME CARE INSTRUCTIONS   Change the factors that you can control. Ask your caregiver for guidance concerning weight loss, quitting smoking, and alcohol consumption.   Avoid foods and drinks that make your symptoms worse, such as:   Caffeine or alcoholic drinks.   Chocolate.   Peppermint or mint flavorings.   Garlic and onions.   Spicy foods.   Citrus fruits, such as oranges, lemons, or limes.   Tomato-based foods such as sauce, chili, salsa, and pizza.   Fried and fatty foods.   Avoid lying down for the 3 hours prior to your bedtime or prior to taking a nap.   Eat small, frequent meals instead of large meals.   Wear loose-fitting clothing. Do not wear anything tight around your waist that causes pressure on your stomach.     Raise the head of your bed 6 to 8 inches with wood blocks to help you sleep. Extra pillows will not help.   Only take over-the-counter or prescription medicines for pain, discomfort, or fever as directed by your caregiver.   Do not take aspirin, ibuprofen, or other nonsteroidal anti-inflammatory drugs (NSAIDs).  SEEK IMMEDIATE MEDICAL CARE IF:   You have pain in your arms, neck, jaw, teeth, or back.   Your pain increases or changes in intensity or duration.   You develop nausea, vomiting, or sweating (diaphoresis).   You develop shortness of breath, or you faint.   Your vomit is green, yellow, black, or looks like coffee grounds or blood.   Your stool is red, bloody, or black.  These symptoms could be signs of other problems, such as heart disease, gastric bleeding, or esophageal bleeding. MAKE SURE YOU:   Understand these instructions.   Will watch your condition.   Will get help right away if you are not doing well or get worse.  Document Released: 04/29/2005 Document  Revised: 07/09/2011 Document Reviewed: 02/06/2011 [facility] Patient Information 2012 [place], [place].

## 2011-12-23 NOTE — MAU Provider Note (Signed)
Sarah A McVay31 [id number] @[redacted]w[redacted]d  by LMP Chief Complaint  Patient presents with  . Abdominal Pain     First Provider Initiated Contact with Patient 12/23/11 2204      SUBJECTIVE  HPI: Pt presents for upper abdominal, epigastric pain which occurred 2-3 times today while sitting up and standing. This pain started 3 days ago.  She is not currently having pain because she took her prescribed Tylenol with codeine immediately prior to coming to the hospital.  She reports feeling fetal movement, denies LOF, vaginal bleeding, vaginal itching/burning, urinary symptoms, h/a, dizziness, n/v, or fever/chills.    Past Medical History  Diagnosis Date  . Depression   . Anemia    Past Surgical History  Procedure Date  . Dilation and curettage of uterus   . Wisdom tooth extraction    History   Social History  . Marital Status: Divorced    Spouse Name: N/A    Number of Children: N/A  . Years of Education: N/A   Occupational History  . Not on file.   Social History Main Topics  . Smoking status: Never Smoker   . Smokeless tobacco: Never Used  . Alcohol Use: No  . Drug Use: No  . Sexually Active: Yes    Birth Control/ Protection: None   Other Topics Concern  . Not on file   Social History Narrative  . No narrative on file   No current facility-administered medications on file prior to encounter.   Current Outpatient Prescriptions on File Prior to Encounter  Medication Sig Dispense Refill  . acetaminophen (TYLENOL) 325 MG tablet Take 650 mg by mouth every 6 (six) hours as needed. For pain or headache      . bisacodyl (DULCOLAX) 5 MG EC tablet Take 5 mg by mouth daily. For constipation      . Prenatal Vit-Fe Fumarate-FA (PRENATAL MULTIVITAMIN) 60-1 MG tablet Take 1 tablet by mouth daily.  90 tablet  0   Allergies  Allergen Reactions  . Latex Anaphylaxis and Hives  . Mushroom Extract Complex Anaphylaxis    ROS: Pertinent items in HPI  OBJECTIVE Blood pressure 133/63,  pulse 78, temperature 97.9 F (36.6 C), temperature source Oral, resp. rate 18, height 5\' 4"  (1.626 m), weight 119.75 kg (264 lb), last menstrual period 08/06/2011, SpO2 99.00%, unknown if currently breastfeeding.  GENERAL: Well-developed, well-nourished female in no acute distress.  HEENT: Normocephalic, good dentition HEART: normal rate RESP: normal effort ABDOMEN: Soft, nontender EXTREMITIES: Nontender, no edema NEURO: Alert and oriented SPECULUM EXAM: Deferred  Fundal height u-1 Burning/sharp pain indicated 3 cm above fundus, nontender to palpation, no guarding  FHT: 150 by doppler   ASSESSMENT Acid reflux of pregnancy  PLAN D/C home Zantac 150 mg PO BID F/U with Dr Gaynell Face if pain persists despite treatment  Return to MAU as needed  LEFTWICH-KIRBY, Jasher Barkan 12/23/2011 10:17 PM

## 2011-12-23 NOTE — MAU Note (Signed)
Upper sharp/stabbing  Abd pain for three days.  Denies any vaginal bleeding or ROM.

## 2011-12-23 NOTE — MAU Note (Signed)
Pt G8 P6 at 19.4wks having upper abd pain x 2 days, denies bleeding or discharge.

## 2012-02-11 ENCOUNTER — Other Ambulatory Visit (HOSPITAL_COMMUNITY): Payer: Self-pay | Admitting: Obstetrics

## 2012-02-11 DIAGNOSIS — O4440 Low lying placenta NOS or without hemorrhage, unspecified trimester: Secondary | ICD-10-CM

## 2012-02-11 DIAGNOSIS — O4400 Placenta previa specified as without hemorrhage, unspecified trimester: Secondary | ICD-10-CM

## 2012-02-17 ENCOUNTER — Ambulatory Visit (HOSPITAL_COMMUNITY)
Admission: RE | Admit: 2012-02-17 | Discharge: 2012-02-17 | Disposition: A | Discharge status: Home / Self Care | Payer: Medicaid Other | Source: Ambulatory Visit | Attending: Obstetrics | Admitting: Obstetrics

## 2012-02-17 ENCOUNTER — Other Ambulatory Visit (HOSPITAL_COMMUNITY): Payer: Self-pay | Admitting: Obstetrics

## 2012-02-17 DIAGNOSIS — O4440 Low lying placenta NOS or without hemorrhage, unspecified trimester: Secondary | ICD-10-CM

## 2012-02-17 DIAGNOSIS — O99210 Obesity complicating pregnancy, unspecified trimester: Secondary | ICD-10-CM | POA: Insufficient documentation

## 2012-02-17 DIAGNOSIS — E669 Obesity, unspecified: Secondary | ICD-10-CM | POA: Insufficient documentation

## 2012-02-17 DIAGNOSIS — O4400 Placenta previa specified as without hemorrhage, unspecified trimester: Secondary | ICD-10-CM | POA: Insufficient documentation

## 2012-02-17 DIAGNOSIS — O09299 Supervision of pregnancy with other poor reproductive or obstetric history, unspecified trimester: Secondary | ICD-10-CM | POA: Insufficient documentation

## 2012-04-01 ENCOUNTER — Other Ambulatory Visit (HOSPITAL_COMMUNITY): Payer: Self-pay | Admitting: Obstetrics

## 2012-04-01 DIAGNOSIS — K8020 Calculus of gallbladder without cholecystitis without obstruction: Secondary | ICD-10-CM

## 2012-04-08 ENCOUNTER — Ambulatory Visit (HOSPITAL_COMMUNITY)
Admission: RE | Admit: 2012-04-08 | Discharge: 2012-04-08 | Disposition: A | Discharge status: Home / Self Care | Payer: Medicaid Other | Source: Ambulatory Visit | Attending: Obstetrics | Admitting: Obstetrics

## 2012-04-08 DIAGNOSIS — K8020 Calculus of gallbladder without cholecystitis without obstruction: Secondary | ICD-10-CM

## 2012-04-08 DIAGNOSIS — O99891 Other specified diseases and conditions complicating pregnancy: Secondary | ICD-10-CM | POA: Insufficient documentation

## 2012-04-08 DIAGNOSIS — R1013 Epigastric pain: Secondary | ICD-10-CM | POA: Insufficient documentation

## 2012-05-10 ENCOUNTER — Encounter (HOSPITAL_COMMUNITY): Payer: Self-pay | Admitting: *Deleted

## 2012-05-10 ENCOUNTER — Telehealth (HOSPITAL_COMMUNITY): Payer: Self-pay | Admitting: *Deleted

## 2012-05-10 NOTE — Telephone Encounter (Signed)
Preadmission screen  

## 2012-05-14 ENCOUNTER — Inpatient Hospital Stay (HOSPITAL_COMMUNITY): Payer: Medicaid Other | Admitting: Anesthesiology

## 2012-05-14 ENCOUNTER — Encounter (HOSPITAL_COMMUNITY): Payer: Self-pay

## 2012-05-14 ENCOUNTER — Encounter (HOSPITAL_COMMUNITY): Payer: Self-pay | Admitting: Anesthesiology

## 2012-05-14 ENCOUNTER — Inpatient Hospital Stay (HOSPITAL_COMMUNITY)
Admission: RE | Admit: 2012-05-14 | Discharge: 2012-05-16 | DRG: 775 | Disposition: A | Discharge status: Home / Self Care | Payer: Medicaid Other | Source: Ambulatory Visit | Attending: Obstetrics | Admitting: Obstetrics

## 2012-05-14 LAB — CBC
MCHC: 32.3 g/dL (ref 30.0–36.0)
RDW: 15.4 % (ref 11.5–15.5)
WBC: 6.5 10*3/uL (ref 4.0–10.5)

## 2012-05-14 MED ORDER — CITRIC ACID-SODIUM CITRATE 334-500 MG/5ML PO SOLN: 30.0000 mL | ORAL | Status: DC | PRN

## 2012-05-14 MED ORDER — LACTATED RINGERS IV SOLN: 500.0000 mL | Freq: Once | INTRAVENOUS | Status: DC

## 2012-05-14 MED ORDER — LACTATED RINGERS IV SOLN
500.0000 mL | Freq: Once | INTRAVENOUS | Status: AC
  Administered 2012-05-14: 500 mL via INTRAVENOUS

## 2012-05-14 MED ORDER — ACETAMINOPHEN 325 MG PO TABS: 650.0000 mg | ORAL_TABLET | ORAL | Status: DC | PRN

## 2012-05-14 MED ORDER — SENNOSIDES-DOCUSATE SODIUM 8.6-50 MG PO TABS
2.0000 | ORAL_TABLET | Freq: Every day | ORAL | Status: DC
  Administered 2012-05-14 – 2012-05-15 (×2): 2 via ORAL

## 2012-05-14 MED ORDER — ONDANSETRON HCL 4 MG PO TABS: 4.0000 mg | ORAL_TABLET | ORAL | Status: DC | PRN

## 2012-05-14 MED ORDER — IBUPROFEN 600 MG PO TABS
600.0000 mg | ORAL_TABLET | Freq: Four times a day (QID) | ORAL | Status: DC
  Administered 2012-05-14 – 2012-05-16 (×7): 600 mg via ORAL
  Filled 2012-05-14 (×3): qty 1

## 2012-05-14 MED ORDER — MISOPROSTOL 200 MCG PO TABS
ORAL_TABLET | ORAL | Status: AC
  Filled 2012-05-14: qty 4

## 2012-05-14 MED ORDER — FLEET ENEMA 7-19 GM/118ML RE ENEM: 1.0000 | ENEMA | RECTAL | Status: DC | PRN

## 2012-05-14 MED ORDER — PHENYLEPHRINE 40 MCG/ML (10ML) SYRINGE FOR IV PUSH (FOR BLOOD PRESSURE SUPPORT)
80.0000 ug | PREFILLED_SYRINGE | INTRAVENOUS | Status: DC | PRN
  Filled 2012-05-14: qty 5

## 2012-05-14 MED ORDER — EPHEDRINE 5 MG/ML INJ: 10.0000 mg | INTRAVENOUS | Status: DC | PRN

## 2012-05-14 MED ORDER — WITCH HAZEL-GLYCERIN EX PADS: 1.0000 "application " | MEDICATED_PAD | CUTANEOUS | Status: DC | PRN

## 2012-05-14 MED ORDER — DIBUCAINE 1 % RE OINT: 1.0000 "application " | TOPICAL_OINTMENT | RECTAL | Status: DC | PRN

## 2012-05-14 MED ORDER — LIDOCAINE HCL (PF) 1 % IJ SOLN
30.0000 mL | INTRAMUSCULAR | Status: DC | PRN
  Filled 2012-05-14: qty 30

## 2012-05-14 MED ORDER — ONDANSETRON HCL 4 MG/2ML IJ SOLN: 4.0000 mg | Freq: Four times a day (QID) | INTRAMUSCULAR | Status: DC | PRN

## 2012-05-14 MED ORDER — TETANUS-DIPHTH-ACELL PERTUSSIS 5-2.5-18.5 LF-MCG/0.5 IM SUSP: 0.5000 mL | Freq: Once | INTRAMUSCULAR | Status: DC

## 2012-05-14 MED ORDER — OXYCODONE-ACETAMINOPHEN 5-325 MG PO TABS: 1.0000 | ORAL_TABLET | ORAL | Status: DC | PRN

## 2012-05-14 MED ORDER — LACTATED RINGERS IV SOLN
INTRAVENOUS | Status: DC
  Administered 2012-05-14 (×2): via INTRAVENOUS

## 2012-05-14 MED ORDER — BUTORPHANOL TARTRATE 1 MG/ML IJ SOLN
1.0000 mg | INTRAMUSCULAR | Status: DC | PRN
  Administered 2012-05-14: 1 mg via INTRAVENOUS
  Filled 2012-05-14: qty 1

## 2012-05-14 MED ORDER — PHENYLEPHRINE 40 MCG/ML (10ML) SYRINGE FOR IV PUSH (FOR BLOOD PRESSURE SUPPORT): 80.0000 ug | PREFILLED_SYRINGE | INTRAVENOUS | Status: DC | PRN

## 2012-05-14 MED ORDER — FERROUS SULFATE 325 (65 FE) MG PO TABS
325.0000 mg | ORAL_TABLET | Freq: Two times a day (BID) | ORAL | Status: DC
  Administered 2012-05-15 (×2): 325 mg via ORAL
  Filled 2012-05-14 (×3): qty 1

## 2012-05-14 MED ORDER — LIDOCAINE HCL (PF) 1 % IJ SOLN
INTRAMUSCULAR | Status: DC | PRN
  Administered 2012-05-14 (×4): 4 mL

## 2012-05-14 MED ORDER — PRENATAL MULTIVITAMIN CH
1.0000 | ORAL_TABLET | Freq: Every day | ORAL | Status: DC
  Administered 2012-05-14 – 2012-05-16 (×3): 1 via ORAL
  Filled 2012-05-14 (×4): qty 1

## 2012-05-14 MED ORDER — OXYTOCIN 40 UNITS IN LACTATED RINGERS INFUSION - SIMPLE MED
1.0000 m[IU]/min | INTRAVENOUS | Status: DC
  Administered 2012-05-14: 2 m[IU]/min via INTRAVENOUS
  Filled 2012-05-14: qty 1000

## 2012-05-14 MED ORDER — LACTATED RINGERS IV SOLN: 500.0000 mL | INTRAVENOUS | Status: DC | PRN

## 2012-05-14 MED ORDER — EPHEDRINE 5 MG/ML INJ
10.0000 mg | INTRAVENOUS | Status: DC | PRN
  Filled 2012-05-14: qty 4

## 2012-05-14 MED ORDER — IBUPROFEN 600 MG PO TABS
600.0000 mg | ORAL_TABLET | Freq: Four times a day (QID) | ORAL | Status: DC | PRN
  Administered 2012-05-14: 600 mg via ORAL
  Filled 2012-05-14 (×5): qty 1

## 2012-05-14 MED ORDER — DIPHENHYDRAMINE HCL 50 MG/ML IJ SOLN: 12.5000 mg | INTRAMUSCULAR | Status: DC | PRN

## 2012-05-14 MED ORDER — OXYTOCIN BOLUS FROM INFUSION
500.0000 mL | Freq: Once | INTRAVENOUS | Status: DC
  Filled 2012-05-14: qty 500

## 2012-05-14 MED ORDER — TERBUTALINE SULFATE 1 MG/ML IJ SOLN: 0.2500 mg | Freq: Once | INTRAMUSCULAR | Status: DC | PRN

## 2012-05-14 MED ORDER — ZOLPIDEM TARTRATE 5 MG PO TABS: 5.0000 mg | ORAL_TABLET | Freq: Every evening | ORAL | Status: DC | PRN

## 2012-05-14 MED ORDER — BENZOCAINE-MENTHOL 20-0.5 % EX AERO
1.0000 "application " | INHALATION_SPRAY | CUTANEOUS | Status: DC | PRN
  Administered 2012-05-14: 1 via TOPICAL
  Filled 2012-05-14: qty 56

## 2012-05-14 MED ORDER — ONDANSETRON HCL 4 MG/2ML IJ SOLN: 4.0000 mg | INTRAMUSCULAR | Status: DC | PRN

## 2012-05-14 MED ORDER — LANOLIN HYDROUS EX OINT: TOPICAL_OINTMENT | CUTANEOUS | Status: DC | PRN

## 2012-05-14 MED ORDER — EPHEDRINE 5 MG/ML INJ
10.0000 mg | INTRAVENOUS | Status: DC | PRN
Start: 2012-05-14 — End: 2012-05-14

## 2012-05-14 MED ORDER — OXYTOCIN 40 UNITS IN LACTATED RINGERS INFUSION - SIMPLE MED: 62.5000 mL/h | Freq: Once | INTRAVENOUS | Status: DC

## 2012-05-14 MED ORDER — DIPHENHYDRAMINE HCL 25 MG PO CAPS: 25.0000 mg | ORAL_CAPSULE | Freq: Four times a day (QID) | ORAL | Status: DC | PRN

## 2012-05-14 MED ORDER — SIMETHICONE 80 MG PO CHEW: 80.0000 mg | CHEWABLE_TABLET | ORAL | Status: DC | PRN

## 2012-05-14 MED ORDER — FENTANYL 2.5 MCG/ML BUPIVACAINE 1/10 % EPIDURAL INFUSION (WH - ANES)
14.0000 mL/h | INTRAMUSCULAR | Status: DC
  Administered 2012-05-14: 14 mL/h via EPIDURAL
  Filled 2012-05-14: qty 125

## 2012-05-14 MED ORDER — FENTANYL 2.5 MCG/ML BUPIVACAINE 1/10 % EPIDURAL INFUSION (WH - ANES): 14.0000 mL/h | INTRAMUSCULAR | Status: DC

## 2012-05-14 NOTE — Anesthesia Preprocedure Evaluation (Signed)
Anesthesia Evaluation  Patient identified by MRN, date of birth, ID band Patient awake    Reviewed: Allergy & Precautions, H&P , NPO status , Patient's Chart, lab work & pertinent test results, reviewed documented beta blocker date and time   History of Anesthesia Complications Negative for: history of anesthetic complications  Airway Mallampati: III TM Distance: >3 FB Neck ROM: full    Dental  (+) Teeth Intact   Pulmonary neg pulmonary ROS,  breath sounds clear to auscultation        Cardiovascular negative cardio ROS  Rhythm:regular Rate:Normal     Neuro/Psych PSYCHIATRIC DISORDERS (postpartum depression) negative neurological ROS     GI/Hepatic negative GI ROS, Neg liver ROS,   Endo/Other  Morbid obesity  Renal/GU negative Renal ROS  negative genitourinary   Musculoskeletal   Abdominal   Peds  Hematology  (+) anemia , H/o blood transfusion after PPH after delivery in 2011   Anesthesia Other Findings   Reproductive/Obstetrics (+) Pregnancy                           Anesthesia Physical Anesthesia Plan  ASA: III  Anesthesia Plan: Epidural   Post-op Pain Management:    Induction:   Airway Management Planned:   Additional Equipment:   Intra-op Plan:   Post-operative Plan:   Informed Consent: I have reviewed the patients History and Physical, chart, labs and discussed the procedure including the risks, benefits and alternatives for the proposed anesthesia with the patient or authorized representative who has indicated his/her understanding and acceptance.     Plan Discussed with:   Anesthesia Plan Comments:         Anesthesia Quick Evaluation

## 2012-05-14 NOTE — Anesthesia Procedure Notes (Signed)
Epidural Patient location during procedure: OB Start time: 05/14/2012 3:38 PM  Staffing Performed by: anesthesiologist   Preanesthetic Checklist Completed: patient identified, site marked, surgical consent, pre-op evaluation, timeout performed, IV checked, risks and benefits discussed and monitors and equipment checked  Epidural Patient position: sitting Prep: site prepped and draped and DuraPrep Patient monitoring: continuous pulse ox and blood pressure Approach: midline Injection technique: LOR air  Needle:  Needle type: Tuohy  Needle gauge: 17 G Needle length: 9 cm and 9 Needle insertion depth: 7 cm Catheter type: closed end flexible Catheter size: 19 Gauge Catheter at skin depth: 12 cm Test dose: negative  Assessment Events: blood not aspirated, injection not painful, no injection resistance, negative IV test and no paresthesia  Additional Notes Discussed risk of headache, infection, bleeding, nerve injury and failed or incomplete block.  Patient voices understanding and wishes to proceed. Reason for block:procedure for pain

## 2012-05-14 NOTE — H&P (Signed)
This is Dr. Francoise Ceo dictating the history and physical on  Sarah [name] she's a 31 year old gravida 8 para 6018 EDC 05/12/2012 40 weeks and 2 days desires induction negative GBS cervix 2 cm 90% vertex -1 amniotomy performed the fluids clear IUPC inserted and she is on low-dose Pitocin Past medical history she's had 2 sets of twins and with each had vaginal delivery a had postpartum hemorrhage Surgical history negative Social history negative Family history noncontributory System review negative Physical exam revealed a well-developed female in labor HEENT negative Breasts negative Heart regular rhythm no murmurs no gallops Lungs clear to P&A Abdomen term Extremities as described above Legs negative

## 2012-05-15 LAB — CBC
Hemoglobin: 10.2 g/dL — ABNORMAL LOW (ref 12.0–15.0)
MCHC: 32.7 g/dL (ref 30.0–36.0)
RDW: 15.6 % — ABNORMAL HIGH (ref 11.5–15.5)

## 2012-05-15 LAB — RPR: RPR Ser Ql: NONREACTIVE

## 2012-05-15 NOTE — Progress Notes (Signed)
Patient ID: Sarah [name], female   DOB: February 25, 1981, 31 y.o.   MRN: [id number] Postpartum day one Vital signs normal Lochia moderate Fundus firm Legs negative No complaints

## 2012-05-15 NOTE — Anesthesia Postprocedure Evaluation (Signed)
  Anesthesia Post-op Note  Patient: Sarah [name]  Procedure(s) Performed: * No procedures listed *  Patient Location: Mother/Baby  Anesthesia Type: Epidural  Level of Consciousness: awake, alert  and oriented  Airway and Oxygen Therapy: Patient Spontanous Breathing  Post-op Pain: mild  Post-op Assessment: Patient's Cardiovascular Status Stable, Respiratory Function Stable, Patent Airway, No signs of Nausea or vomiting and Pain level controlled  Post-op Vital Signs: stable  Complications: No apparent anesthesia complications

## 2012-05-16 NOTE — Progress Notes (Signed)
UR chart review completed.  

## 2012-05-16 NOTE — Discharge Summary (Signed)
Obstetric Discharge Summary Reason for Admission: induction of labor Prenatal Procedures: none Intrapartum Procedures: spontaneous vaginal delivery Postpartum Procedures: none Complications-Operative and Postpartum: none Hemoglobin  Date Value Range Status  05/15/2012 10.2* 12.0 - 15.0 g/dL Final     HCT  Date Value Range Status  05/15/2012 31.2* 36.0 - 46.0 % Final    Physical Exam:  General: alert Lochia: appropriate Uterine Fundus: firm Incision: healing well DVT Evaluation: No evidence of DVT seen on physical exam.  Discharge Diagnoses: Term Pregnancy-delivered  Discharge Information: Date: 05/16/2012 Activity: pelvic rest Diet: routine Medications: Percocet Condition: stable Instructions: refer to practice specific booklet Discharge to: home Follow-up Information    Schedule an appointment as soon as possible for a visit in 6 weeks to follow up.   Contact information:   Sarah [name]         Newborn Data: Live born female  Birth Weight: 5 lb 7.5 oz (2480 g) APGAR: 9, 9  Home with mother.  Sarah [name] A 05/16/2012, 6:31 AM

## 2012-05-18 LAB — TYPE AND SCREEN: Antibody Screen: NEGATIVE

## 2012-05-23 ENCOUNTER — Encounter (HOSPITAL_COMMUNITY): Payer: Self-pay

## 2013-06-29 ENCOUNTER — Encounter (HOSPITAL_COMMUNITY): Payer: Self-pay | Admitting: Emergency Medicine

## 2013-06-29 ENCOUNTER — Emergency Department (HOSPITAL_COMMUNITY)
Admission: EM | Admit: 2013-06-29 | Discharge: 2013-06-29 | Disposition: A | Discharge status: Home / Self Care | Payer: Medicaid Other | Attending: Emergency Medicine | Admitting: Emergency Medicine

## 2013-06-29 ENCOUNTER — Emergency Department (HOSPITAL_COMMUNITY): Payer: Medicaid Other

## 2013-06-29 DIAGNOSIS — O9989 Other specified diseases and conditions complicating pregnancy, childbirth and the puerperium: Secondary | ICD-10-CM | POA: Insufficient documentation

## 2013-06-29 DIAGNOSIS — N949 Unspecified condition associated with female genital organs and menstrual cycle: Secondary | ICD-10-CM | POA: Insufficient documentation

## 2013-06-29 DIAGNOSIS — Z3490 Encounter for supervision of normal pregnancy, unspecified, unspecified trimester: Secondary | ICD-10-CM

## 2013-06-29 DIAGNOSIS — R102 Pelvic and perineal pain: Secondary | ICD-10-CM

## 2013-06-29 DIAGNOSIS — Z862 Personal history of diseases of the blood and blood-forming organs and certain disorders involving the immune mechanism: Secondary | ICD-10-CM | POA: Insufficient documentation

## 2013-06-29 DIAGNOSIS — Z8659 Personal history of other mental and behavioral disorders: Secondary | ICD-10-CM | POA: Insufficient documentation

## 2013-06-29 DIAGNOSIS — Z91040 Latex allergy status: Secondary | ICD-10-CM | POA: Insufficient documentation

## 2013-06-29 DIAGNOSIS — R42 Dizziness and giddiness: Secondary | ICD-10-CM | POA: Insufficient documentation

## 2013-06-29 DIAGNOSIS — Z8619 Personal history of other infectious and parasitic diseases: Secondary | ICD-10-CM | POA: Insufficient documentation

## 2013-06-29 DIAGNOSIS — R51 Headache: Secondary | ICD-10-CM | POA: Insufficient documentation

## 2013-06-29 DIAGNOSIS — R300 Dysuria: Secondary | ICD-10-CM | POA: Insufficient documentation

## 2013-06-29 DIAGNOSIS — R109 Unspecified abdominal pain: Secondary | ICD-10-CM | POA: Insufficient documentation

## 2013-06-29 DIAGNOSIS — N898 Other specified noninflammatory disorders of vagina: Secondary | ICD-10-CM | POA: Insufficient documentation

## 2013-06-29 LAB — URINALYSIS, ROUTINE W REFLEX MICROSCOPIC
Leukocytes, UA: NEGATIVE
Protein, ur: NEGATIVE mg/dL
Urobilinogen, UA: 0.2 mg/dL (ref 0.0–1.0)

## 2013-06-29 LAB — WET PREP, GENITAL
Clue Cells Wet Prep HPF POC: NONE SEEN
Yeast Wet Prep HPF POC: NONE SEEN

## 2013-06-29 LAB — PREGNANCY, URINE: Preg Test, Ur: POSITIVE — AB

## 2013-06-29 NOTE — ED Notes (Signed)
Pt from home reports that she has had lower sbd pain x3 days. Pt denies N/V/D, fever. Pt states that initially she had dysuria, but has now subsided. Pt denies any vaginal d/c, itching. Pt is A&O and in NAD

## 2013-06-29 NOTE — ED Notes (Signed)
Patient transported to Ultrasound 

## 2013-06-29 NOTE — ED Provider Notes (Signed)
CSN: [id number]     Arrival date & time 06/29/13  4098 History   First MD Initiated Contact with Patient 06/29/13 [phone]     No chief complaint on file.   HPI  Sarah [name] is a 32 y.o. female with a PMH of anemia, depression, hx of chlamydi/gonorrhea/trichomonas, and blood transfusion who presents to the ED for evaluation of abdominal pain.  History was provided by the patient.  Patient states that she has had intermittent abdominal pain for the past 3 days. Her pain is located in the middle lower pelvis without radiation. Her pain is described as a throbbing and sharp sensation. She states that her pain is worse with certain movements and initially with urination. She denies any nausea, vomiting, or vaginal discharge.  She had minimal vaginal bleeding after intercourse last night, which resolved. Her last normal menstrual period was October 3. She is currently sexually active with no new partners. She states that her children were sick with a viral gastritis with nausea and vomiting. She denies any of these symptoms.  G8P9.  Patient denies any previous abdominal surgeries.  She does not use any means of contraception.  No recent fevers, chills, change in appetite or activity, rhinorrhea, chest pain, shortness of breath, weakness, or fatigue. She intermittently has had headaches and lightheadedness however denies this currently.     Past Medical History  Diagnosis Date  . Anemia   . Depression     pp with all deliveries no meds  . Hx of chlamydia infection   . Hx of gonorrhea   . Trichomonas contact, treated   . Blood transfusion without reported diagnosis     2 units after 2011 delivery   Past Surgical History  Procedure Laterality Date  . Dilation and curettage of uterus    . Wisdom tooth extraction     Family History  Problem Relation Age of Onset  . Adopted: Yes  . Anesthesia problems Neg Hx   . Hypotension Neg Hx   . Malignant hyperthermia Neg Hx   . Pseudochol deficiency Neg  Hx    History  Substance Use Topics  . Smoking status: Never Smoker   . Smokeless tobacco: Never Used  . Alcohol Use: No   OB History   Grav Para Term Preterm Abortions TAB SAB Ect Mult Living   8 7 7  1  1  2 9      Review of Systems  Constitutional: Negative for fever, chills, diaphoresis, activity change, appetite change and fatigue.  HENT: Negative for congestion, rhinorrhea and sore throat.   Respiratory: Negative for cough and shortness of breath.   Cardiovascular: Negative for chest pain and leg swelling.  Gastrointestinal: Positive for abdominal pain. Negative for nausea, vomiting, diarrhea, constipation and blood in stool.  Genitourinary: Positive for dysuria, vaginal bleeding (post-intercourse) and pelvic pain. Negative for hematuria, flank pain, decreased urine volume, vaginal discharge, difficulty urinating, genital sores, vaginal pain, menstrual problem and dyspareunia.  Musculoskeletal: Negative for back pain and myalgias.  Skin: Negative for color change and wound.  Neurological: Positive for light-headedness (intermittent) and headaches (intermittent). Negative for weakness.    Allergies  Latex and Mushroom extract complex  Home Medications  No current outpatient prescriptions on file. Pulse 72  Temp(Src) 98.7 F (37.1 C) (Oral)  Resp 20  SpO2 97%  Filed Vitals:   06/29/13 0939 06/29/13 1303  BP: 124/70 121/57  Pulse: 72 62  Temp: 98.7 F (37.1 C) 98 F (36.7 C)  TempSrc:  Oral Oral  Resp: 20 15  SpO2: 97% 100%    Physical Exam  Nursing note and vitals reviewed. Constitutional: She is oriented to person, place, and time. She appears well-developed and well-nourished. No distress.  HENT:  Head: Normocephalic and atraumatic.  Right Ear: External ear normal.  Left Ear: External ear normal.  Nose: Nose normal.  Mouth/Throat: Oropharynx is clear and moist.  Eyes: Conjunctivae are normal. Right eye exhibits no discharge. Left eye exhibits no discharge.   Neck: Normal range of motion. Neck supple.  Cardiovascular: Normal rate, regular rhythm and normal heart sounds.  Exam reveals no gallop and no friction rub.   No murmur heard. Pulmonary/Chest: Effort normal and breath sounds normal. No respiratory distress. She has no wheezes. She has no rales. She exhibits no tenderness.  Abdominal: Soft. Bowel sounds are normal. She exhibits no distension and no mass. There is tenderness. There is no rebound and no guarding.  Middle lower quadrant/pelvic tenderness to palpation.  Musculoskeletal: Normal range of motion. She exhibits no edema and no tenderness.  Patient able to ambulate without difficulty or ataxia.  No pedal edema bilaterally  Neurological: She is alert and oriented to person, place, and time.  Skin: Skin is warm and dry. She is not diaphoretic.    ED Course  Procedures (including critical care time) Labs Review Labs Reviewed - No data to display Imaging Review No results found.  EKG Interpretation   None      Results for orders placed during the hospital encounter of 06/29/13  WET PREP, GENITAL      Result Value Range   Yeast Wet Prep HPF POC NONE SEEN  NONE SEEN   Trich, Wet Prep NONE SEEN  NONE SEEN   Clue Cells Wet Prep HPF POC NONE SEEN  NONE SEEN   WBC, Wet Prep HPF POC FEW (*) NONE SEEN  URINALYSIS, ROUTINE W REFLEX MICROSCOPIC      Result Value Range   Color, Urine YELLOW  YELLOW   APPearance CLEAR  CLEAR   Specific Gravity, Urine 1.017  1.005 - 1.030   pH 6.0  5.0 - 8.0   Glucose, UA NEGATIVE  NEGATIVE mg/dL   Hgb urine dipstick NEGATIVE  NEGATIVE   Bilirubin Urine NEGATIVE  NEGATIVE   Ketones, ur NEGATIVE  NEGATIVE mg/dL   Protein, ur NEGATIVE  NEGATIVE mg/dL   Urobilinogen, UA 0.2  0.0 - 1.0 mg/dL   Nitrite NEGATIVE  NEGATIVE   Leukocytes, UA NEGATIVE  NEGATIVE  PREGNANCY, URINE      Result Value Range   Preg Test, Ur POSITIVE (*) NEGATIVE  HCG, QUANTITATIVE, PREGNANCY      Result Value Range   hCG,  Beta Chain, Quant, S 1414 (*) <5 mIU/mL       US OB Comp Less 14 Wks (Final result)  Result time: 06/29/13 12:31:37    Final result by Rad Results In Interface (06/29/13 12:31:37)    Narrative:   CLINICAL DATA: Abdominal pain. Pregnant.  EXAM: TRANSVAGINAL OB ULTRASOUND; OBSTETRIC <14 WK ULTRASOUND  TECHNIQUE: Transvaginal ultrasound was performed for complete evaluation of the gestation as well as the maternal uterus, adnexal regions, and pelvic cul-de-sac.  COMPARISON: None.  FINDINGS: Intrauterine gestational sac: Present, single.  Yolk sac: Not visualized  Embryo: Not visualized  Cardiac Activity: Not visualized  MSD: 5.3 mm 5 w 2 d  [place] EDC: 02/27/2014  Maternal uterus/adnexae: 18 x 22 mm right ovarian cyst or follicle. There is a separate 11 x  13 mm peripherally calcified right adnexal nodule. The endometrium is heterogeneous in echotexture. There is a small amount of free pelvic fluid.  IMPRESSION: 1. 5 week 2 day probable early intrauterine gestational sac, but no yolk sac, fetal pole, or cardiac activity yet visualized. Recommend follow-up quantitative B-HCG levels and follow-up US in 14 days to confirm and assess viability. This recommendation follows SRU consensus guidelines: Diagnostic Criteria for Nonviable Pregnancy Early in the First Trimester. Malva Limes Med 2013; 161:0960-45.   Electronically Signed By: Oley Balm M.D. On: 06/29/2013 12:31             US OB Transvaginal (Final result)  Result time: 06/29/13 12:31:37    Final result by Rad Results In Interface (06/29/13 12:31:37)    Narrative:   CLINICAL DATA: Abdominal pain. Pregnant.  EXAM: TRANSVAGINAL OB ULTRASOUND; OBSTETRIC <14 WK ULTRASOUND  TECHNIQUE: Transvaginal ultrasound was performed for complete evaluation of the gestation as well as the maternal uterus, adnexal regions, and pelvic cul-de-sac.  COMPARISON: None.  FINDINGS: Intrauterine gestational sac: Present,  single.  Yolk sac: Not visualized  Embryo: Not visualized  Cardiac Activity: Not visualized  MSD: 5.3 mm 5 w 2 d  [place] EDC: 02/27/2014  Maternal uterus/adnexae: 18 x 22 mm right ovarian cyst or follicle. There is a separate 11 x 13 mm peripherally calcified right adnexal nodule. The endometrium is heterogeneous in echotexture. There is a small amount of free pelvic fluid.  IMPRESSION: 1. 5 week 2 day probable early intrauterine gestational sac, but no yolk sac, fetal pole, or cardiac activity yet visualized. Recommend follow-up quantitative B-HCG levels and follow-up US in 14 days to confirm and assess viability. This recommendation follows SRU consensus guidelines: Diagnostic Criteria for Nonviable Pregnancy Early in the First Trimester. Malva Limes Med 2013; 409:8119-14.   Electronically Signed By: Oley Balm M.D. On: 06/29/2013 12:31         MDM   Sarah [name] is a 32 y.o. female with a PMH of anemia, depression, hx of chlamydi/gonorrhea/trichomonas, and blood transfusion who presents to the ED for evaluation of abdominal pain.  UA and urine pregnancy ordered. Pelvic exam will be performed with a wet mount and Gonorrhea/Chlamydia testing.     Rechecks  10:14 AM = Pelvic exam performed at bedside with RN staff Fleet Contras present.  Patient had a minimal amount of clear discharge present in the vaginal vault.  Os is closed with no surrounding erythema.  No CMT or adnexal tenderness.  No blood in the vaginal vault.   10:31 AM = Informed patient of positive pregnancy test.  Patient on her cell phone in no acute distress. Pelvic ultrasound and beta hCG test ordered. Previous records were reviewed which shows she is Rh positive (B).   1:00 PM = Patient sitting in the exam chair dressed.  She states she feels better.  Repeat abdominal exam benign.  Patient's OB/GYN Dr. Neva Seat.       Etiology of lower middle abdomen/pelvic pain possibly due to newly diagnosed pregnancy.  Pregnancy test was positive.  Ultrasound confirms a possible early intrauterine gestational sac with no fetal pole/cardiac activity. Patient is approximately 5 weeks and 2 days pregnant via [place] and her beta hCG correlates with this. Her abdominal exam was benign.  No evidence of a UTI at this time. Gonorrhea and Chlamydia testing pending.  Patient states she had minimal vaginal bleeding post intercourse last night. She is Rh+. She had no vaginal bleeding throughout her ED visit. Patient was  instructed to followup with OB/GYN as soon as possible. Patient afebrile and nontoxic in appearance. She remained in no acute distress. Return precautions were discussed.  Patient in agreement with discharge and plan.     Discharge Medication List as of 06/29/2013  1:04 PM      Final impressions: 1. Pregnancy   2. Pelvic pain in female       Luiz Iron PA-C           Jillyn Ledger, PA-C 06/29/13 1353

## 2013-07-01 ENCOUNTER — Inpatient Hospital Stay (HOSPITAL_COMMUNITY)
Admission: AD | Admit: 2013-07-01 | Discharge: 2013-07-02 | Disposition: A | Discharge status: Home / Self Care | Payer: Medicaid Other | Source: Ambulatory Visit | Attending: Obstetrics & Gynecology | Admitting: Obstetrics & Gynecology

## 2013-07-01 DIAGNOSIS — O26859 Spotting complicating pregnancy, unspecified trimester: Secondary | ICD-10-CM | POA: Insufficient documentation

## 2013-07-01 DIAGNOSIS — O200 Threatened abortion: Secondary | ICD-10-CM

## 2013-07-01 DIAGNOSIS — R109 Unspecified abdominal pain: Secondary | ICD-10-CM | POA: Insufficient documentation

## 2013-07-01 DIAGNOSIS — O0281 Inappropriate change in quantitative human chorionic gonadotropin (hCG) in early pregnancy: Secondary | ICD-10-CM | POA: Insufficient documentation

## 2013-07-02 ENCOUNTER — Encounter (HOSPITAL_COMMUNITY): Payer: Self-pay | Admitting: *Deleted

## 2013-07-02 DIAGNOSIS — O200 Threatened abortion: Secondary | ICD-10-CM

## 2013-07-02 LAB — CBC
Hemoglobin: 10.9 g/dL — ABNORMAL LOW (ref 12.0–15.0)
MCHC: 32.8 g/dL (ref 30.0–36.0)
Platelets: 231 10*3/uL (ref 150–400)
RBC: 4.15 MIL/uL (ref 3.87–5.11)
WBC: 8.3 10*3/uL (ref 4.0–10.5)

## 2013-07-02 LAB — URINALYSIS, ROUTINE W REFLEX MICROSCOPIC
Glucose, UA: NEGATIVE mg/dL
Ketones, ur: NEGATIVE mg/dL
Leukocytes, UA: NEGATIVE
Specific Gravity, Urine: 1.02 (ref 1.005–1.030)
pH: 6 (ref 5.0–8.0)

## 2013-07-02 LAB — HCG, QUANTITATIVE, PREGNANCY: hCG, Beta Chain, Quant, S: 1778 m[IU]/mL — ABNORMAL HIGH (ref <5)

## 2013-07-02 NOTE — Progress Notes (Signed)
Wynelle Bourgeois CNM in to discuss test results and plan of care. Written and verbal d/c instructions given and understanding voiced

## 2013-07-02 NOTE — MAU Note (Signed)
Been having Abd cramping and seen at [facility] Thurs. Found out i was pregnant. Was having some bleeding on Thursday which was on and off and today more of a brown color. Tonight I went to the [facility] and saw like ? Tissue in toilet. Cont to have some abd cramping

## 2013-07-02 NOTE — MAU Provider Note (Signed)
History     CSN: [id number]  Arrival date and time: 07/01/13 2358   None     Chief Complaint  Patient presents with  . Abdominal Cramping   HPI This is a 32 y.o. female G11 P51 at [redacted]w[redacted]d who presents with c/o spotting today. Thinks she passed some tissue, not blood. No cramping, fever, or other symptoms. Was seen in ED 2 days ago. Quant HCG was 1414 two days ago.  RN Note: Been having Abd cramping and seen at [facility]. Found out i was pregnant. Was having some bleeding on Thursday which was on and off and today more of a brown color. Tonight I went to the [facility] and saw like ? Tissue in toilet. Cont to have some abd cramping        Past Medical History  Diagnosis Date  . Anemia   . Depression     pp with all deliveries no meds  . Hx of chlamydia infection   . Hx of gonorrhea   . Trichomonas contact, treated   . Blood transfusion without reported diagnosis     2 units after 2011 delivery    Past Surgical History  Procedure Laterality Date  . Dilation and curettage of uterus    . Wisdom tooth extraction      Family History  Problem Relation Age of Onset  . Adopted: Yes  . Anesthesia problems Neg Hx   . Hypotension Neg Hx   . Malignant hyperthermia Neg Hx   . Pseudochol deficiency Neg Hx     History  Substance Use Topics  . Smoking status: Never Smoker   . Smokeless tobacco: Never Used  . Alcohol Use: No    Allergies:  Allergies  Allergen Reactions  . Latex Anaphylaxis and Hives  . Mushroom Extract Complex Anaphylaxis    Prescriptions prior to admission  Medication Sig Dispense Refill  . ibuprofen (ADVIL,MOTRIN) 200 MG tablet Take 400 mg by mouth every 6 (six) hours as needed for mild pain or moderate pain.         Review of Systems  Constitutional: Negative for fever, chills and malaise/fatigue.  Gastrointestinal: Negative for nausea, vomiting, abdominal pain, diarrhea and constipation.  Genitourinary:       Spotting  Neurological: Negative for  headaches.   Physical Exam   Temperature 98.6 F (37 C), resp. rate 20, height 5\' 4"  (1.626 m), weight 123.106 kg (271 lb 6.4 oz), last menstrual period 05/05/2013.  Physical Exam  Constitutional: She is oriented to person, place, and time. She appears well-developed and well-nourished. No distress.  Cardiovascular: Normal rate.   Respiratory: Effort normal.  GI: Soft. There is no tenderness.  Genitourinary: Vaginal discharge (scant spotting) found.  Pelvic deferred since completed exam done 2 days ago with cultures   Musculoskeletal: Normal range of motion.  Neurological: She is alert and oriented to person, place, and time.  Skin: Skin is warm and dry.  Psychiatric: She has a normal mood and affect.  Last [place]:  US Ob Transvaginal  06/29/2013   CLINICAL DATA:  Abdominal pain.  Pregnant.  EXAM: TRANSVAGINAL OB ULTRASOUND; OBSTETRIC <14 WK ULTRASOUND  TECHNIQUE: Transvaginal ultrasound was performed for complete evaluation of the gestation as well as the maternal uterus, adnexal regions, and pelvic cul-de-sac.  COMPARISON:  None.  FINDINGS: Intrauterine gestational sac: Present, single.  Yolk sac:  Not visualized  Embryo:  Not visualized  Cardiac Activity: Not visualized  MSD: 5.3  mm   5 w  2  d  [place] EDC: 02/27/2014  Maternal uterus/adnexae: 18 x 22 mm right ovarian cyst or follicle. There is a separate 11 x 13 mm peripherally calcified right adnexal nodule. The endometrium is heterogeneous in echotexture. There is a small amount of free pelvic fluid.    IMPRESSION: 1. 5 week 2 day probable early intrauterine gestational sac, but no yolk sac, fetal pole, or cardiac activity yet visualized. Recommend follow-up quantitative B-HCG levels and follow-up US in 14 days to confirm and assess viability.   This recommendation follows SRU consensus guidelines: Diagnostic Criteria for Nonviable Pregnancy Early in the First Trimester. Malva Limes Med 2013; 161:0960-45.   Electronically Signed   By: Oley Balm M.D.   On: 06/29/2013 12:31    MAU Course  Procedures  MDM Will repeat Quant HCG Results for orders placed during the hospital encounter of 07/01/13 (from the past 24 hour(s))  URINALYSIS, ROUTINE W REFLEX MICROSCOPIC     Status: None   Collection Time    07/02/13 12:16 AM      Result Value Range   Color, Urine YELLOW  YELLOW   APPearance CLEAR  CLEAR   Specific Gravity, Urine 1.020  1.005 - 1.030   pH 6.0  5.0 - 8.0   Glucose, UA NEGATIVE  NEGATIVE mg/dL   Hgb urine dipstick NEGATIVE  NEGATIVE   Bilirubin Urine NEGATIVE  NEGATIVE   Ketones, ur NEGATIVE  NEGATIVE mg/dL   Protein, ur NEGATIVE  NEGATIVE mg/dL   Urobilinogen, UA 0.2  0.0 - 1.0 mg/dL   Nitrite NEGATIVE  NEGATIVE   Leukocytes, UA NEGATIVE  NEGATIVE  HCG, QUANTITATIVE, PREGNANCY     Status: Abnormal   Collection Time    07/02/13 12:57 AM      Result Value Range   hCG, Beta Chain, Quant, S 1778 (*) <5 mIU/mL  CBC     Status: Abnormal   Collection Time    07/02/13 12:57 AM      Result Value Range   WBC 8.3  4.0 - 10.5 K/uL   RBC 4.15  3.87 - 5.11 MIL/uL   Hemoglobin 10.9 (*) 12.0 - 15.0 g/dL   HCT 40.9 (*) 81.1 - 91.4 %   MCV 80.0  78.0 - 100.0 fL   MCH 26.3  26.0 - 34.0 pg   MCHC 32.8  30.0 - 36.0 g/dL   RDW 78.2  95.6 - 21.3 %   Platelets 231  150 - 400 K/uL   Last Quant = 1414  Assessment and Plan  A:  Pregnancy with probable IUGS but no yolk sac, now with inappropriate doubling of Quant HCG levels  P:  Discussed with Dr Marice Potter      Repeat US in a week       SAB / ectopic precautions.   [facility] 07/02/2013, 12:37 AM

## 2013-07-03 NOTE — ED Provider Notes (Signed)
Medical screening examination/treatment/procedure(s) were performed by non-physician practitioner and as supervising physician I was immediately available for consultation/collaboration.  EKG Interpretation   None         Rolan Bucco, MD 07/03/13 2341

## 2013-07-07 ENCOUNTER — Inpatient Hospital Stay (HOSPITAL_COMMUNITY)
Admission: AD | Admit: 2013-07-07 | Discharge: 2013-07-07 | Disposition: A | Discharge status: Home / Self Care | Payer: Medicaid Other | Source: Ambulatory Visit | Attending: Family Medicine | Admitting: Family Medicine

## 2013-07-07 ENCOUNTER — Ambulatory Visit (HOSPITAL_COMMUNITY)
Admission: RE | Admit: 2013-07-07 | Discharge: 2013-07-07 | Disposition: A | Discharge status: Home / Self Care | Payer: Medicaid Other | Source: Ambulatory Visit | Attending: Advanced Practice Midwife | Admitting: Advanced Practice Midwife

## 2013-07-07 DIAGNOSIS — O200 Threatened abortion: Secondary | ICD-10-CM | POA: Insufficient documentation

## 2013-07-07 DIAGNOSIS — O009 Unspecified ectopic pregnancy without intrauterine pregnancy: Secondary | ICD-10-CM

## 2013-07-07 DIAGNOSIS — O289 Unspecified abnormal findings on antenatal screening of mother: Secondary | ICD-10-CM | POA: Insufficient documentation

## 2013-07-07 LAB — CREATININE, SERUM
Creatinine, Ser: 1.02 mg/dL (ref 0.50–1.10)
GFR calc non Af Amer: 73 mL/min — ABNORMAL LOW (ref >90)

## 2013-07-07 LAB — BUN: BUN: 9 mg/dL (ref 6–23)

## 2013-07-07 LAB — AST: AST: 24 U/L (ref 0–37)

## 2013-07-07 LAB — CBC
HCT: 33.9 % — ABNORMAL LOW (ref 36.0–46.0)
Hemoglobin: 10.9 g/dL — ABNORMAL LOW (ref 12.0–15.0)
MCH: 26 pg (ref 26.0–34.0)
MCHC: 32.2 g/dL (ref 30.0–36.0)
Platelets: 218 10*3/uL (ref 150–400)
WBC: 7.3 10*3/uL (ref 4.0–10.5)

## 2013-07-07 LAB — HCG, QUANTITATIVE, PREGNANCY: hCG, Beta Chain, Quant, S: 2263 m[IU]/mL — ABNORMAL HIGH (ref <5)

## 2013-07-07 IMAGING — US US OB TRANSVAGINAL
1 series · 14 of 28 positions shown · non-contrast
Comparison: Ultrasound [DATE] .

CLINICAL DATA: Pregnancy.

EXAM:
TRANSVAGINAL OB ULTRASOUND
TECHNIQUE: Transvaginal ultrasound was performed for complete evaluation of the
gestation as well as the maternal uterus, adnexal regions, and
pelvic cul-de-sac.

[Series 1: us ob transvaginal · 32 acquisitions, 14 frames shown]
[im 2/32]
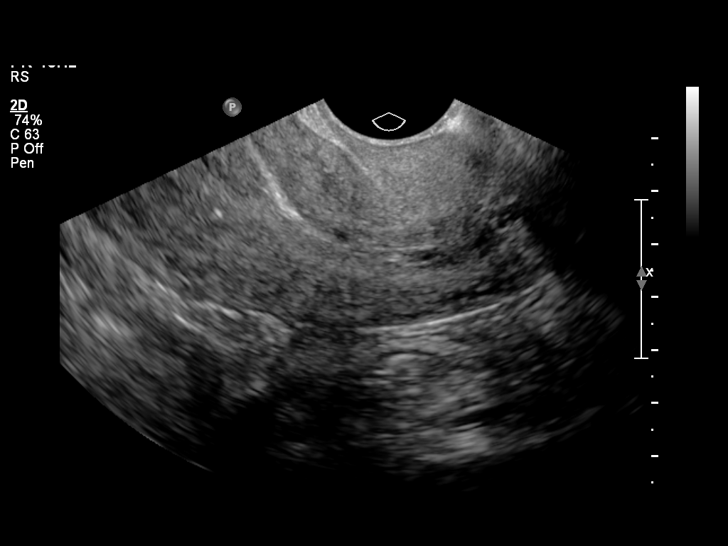
[im 4/32]
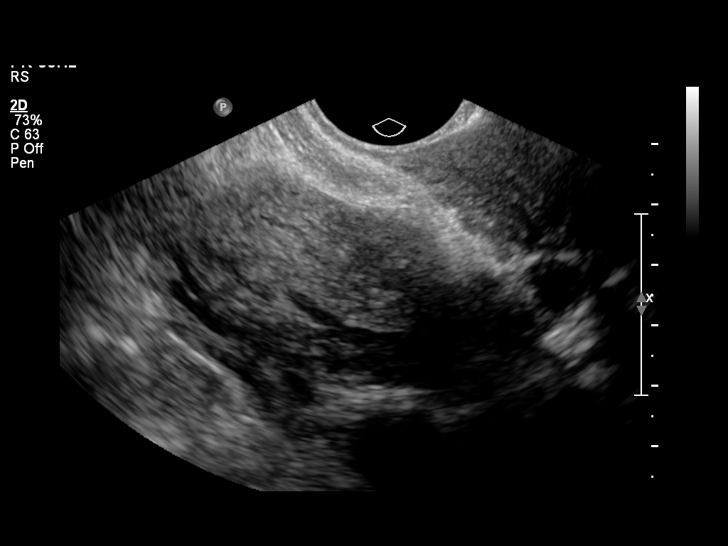
[im 6/32]
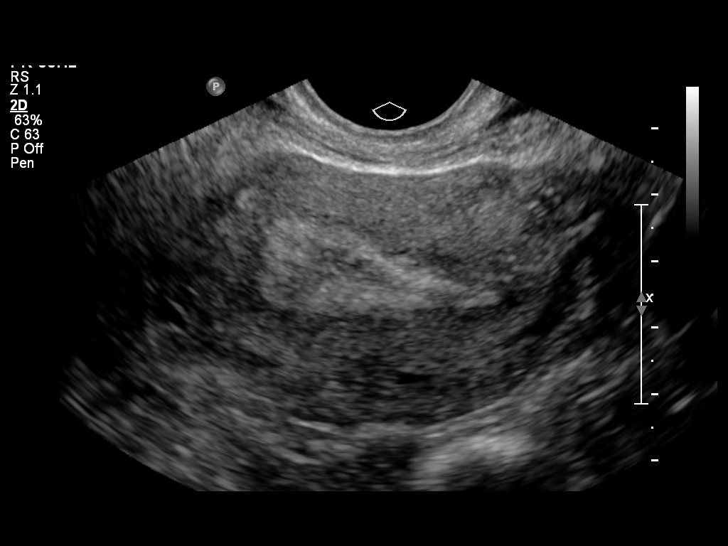
[im 9/32]
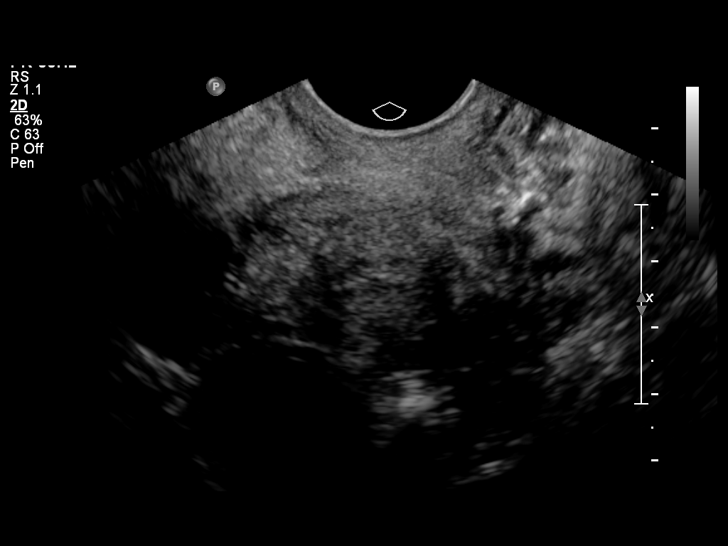
[im 11/32]
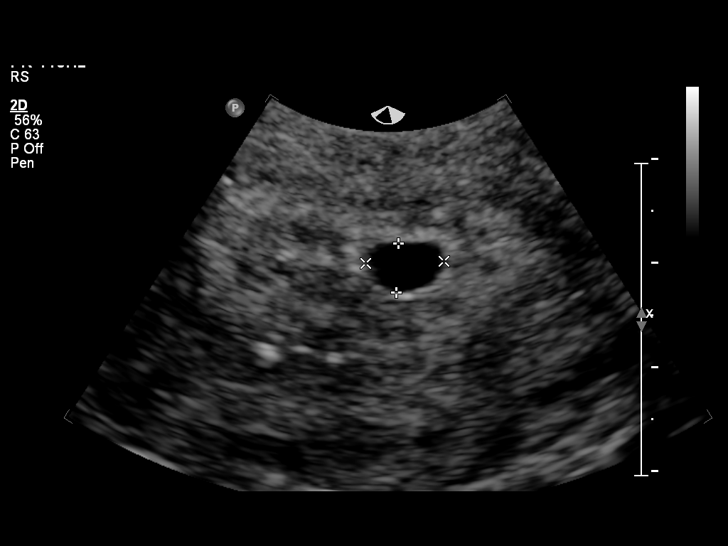
[im 13/32]
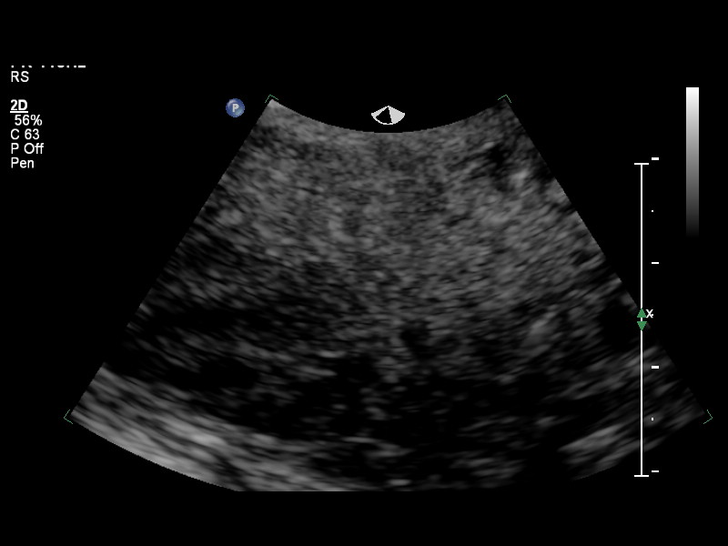
[im 15/32]
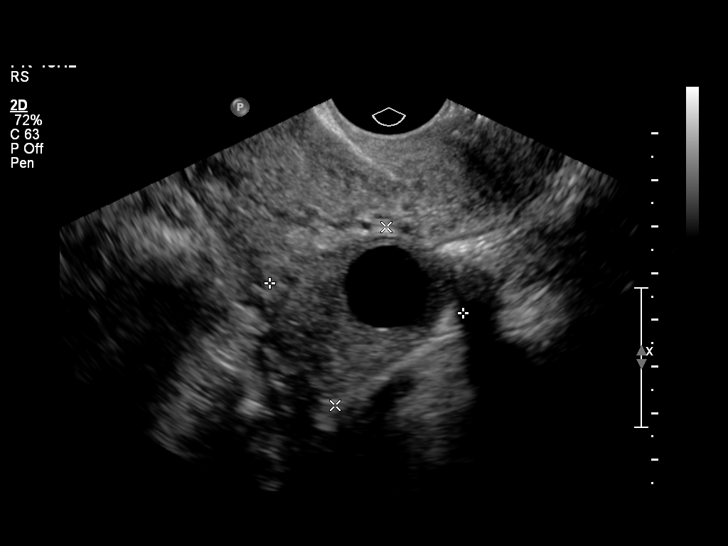
[im 18/32]
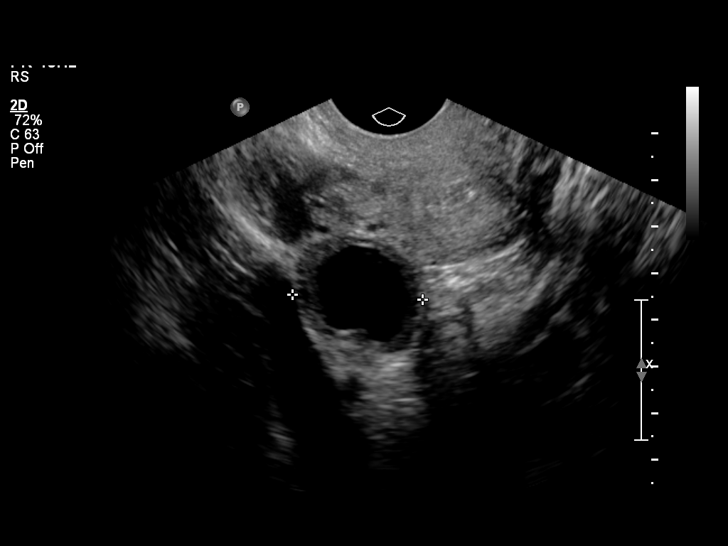
[im 20/32]
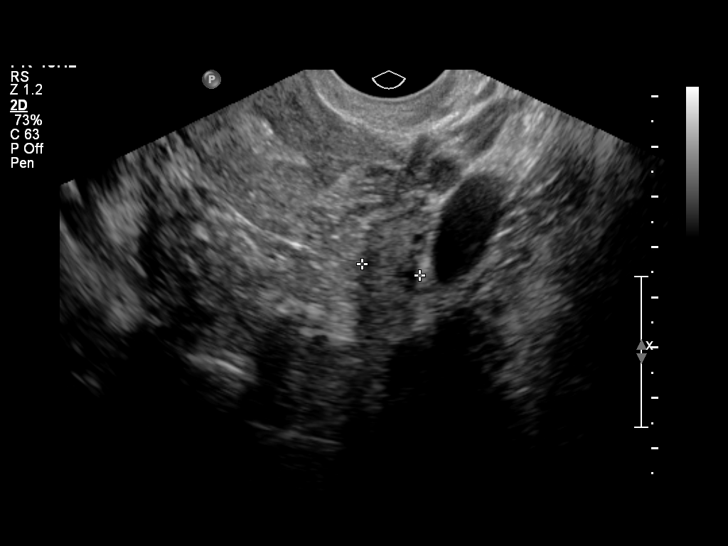
[im 22/32]
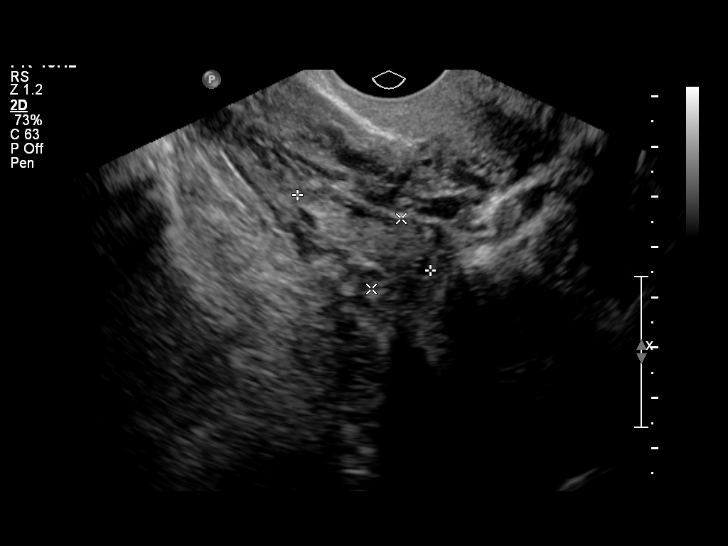
[im 25/32]
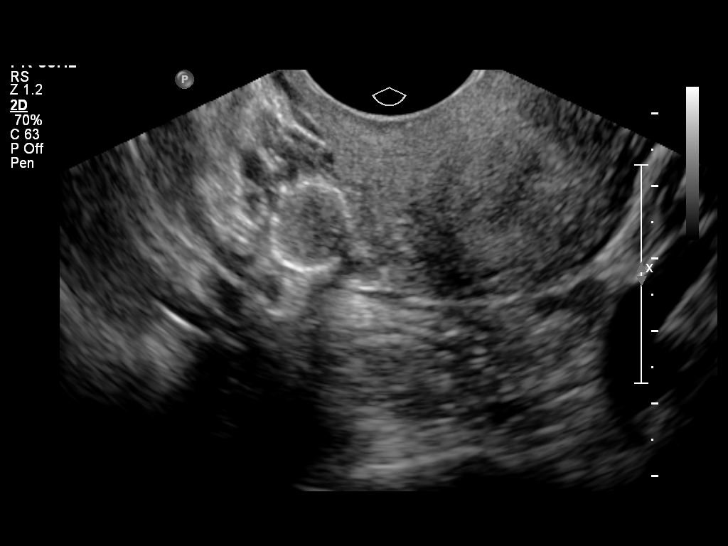
[im 27/32]
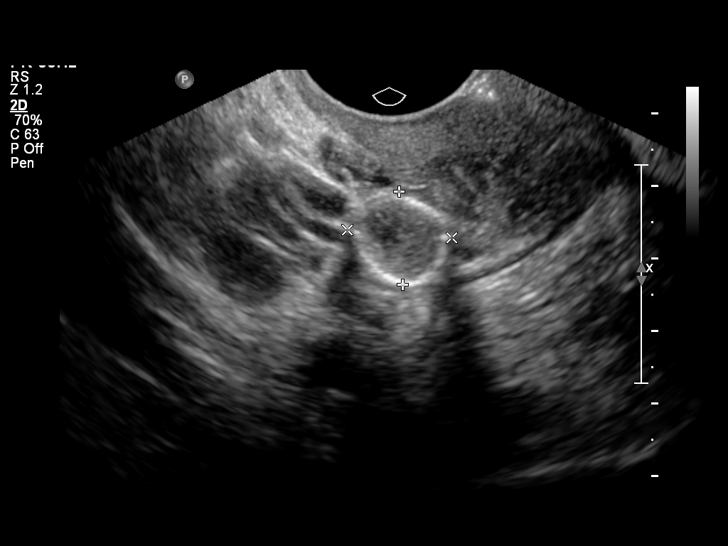
[im 29/32]
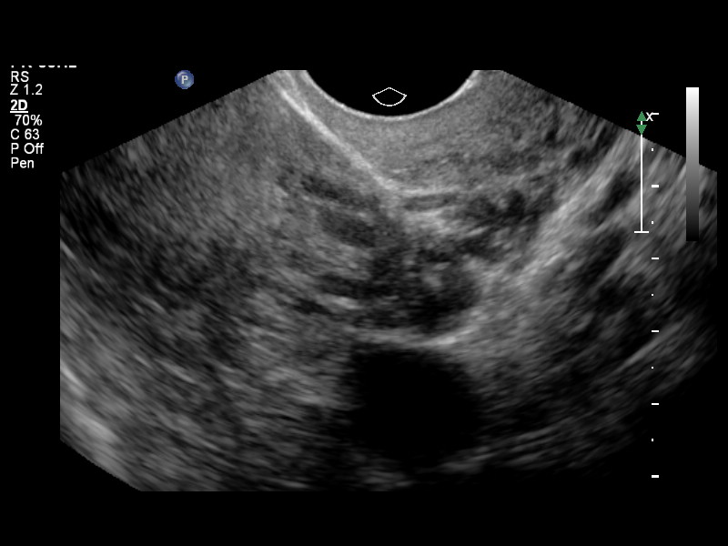
[im 32/32]
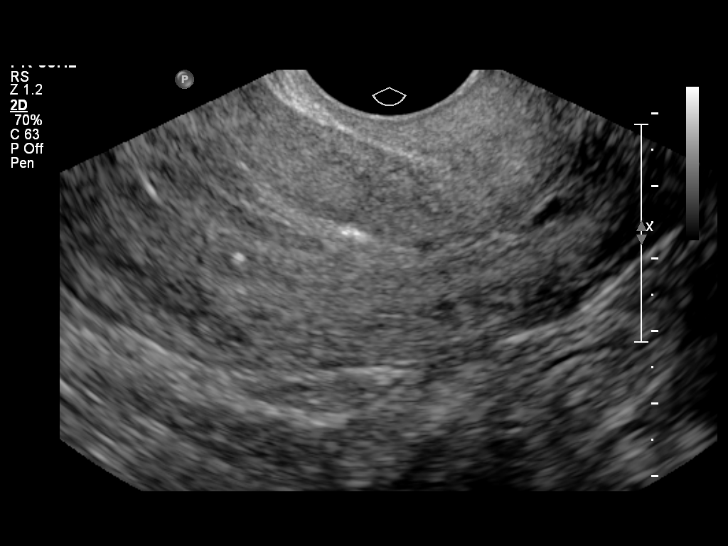

[14 of 28 positions shown; findings below may reference images not displayed]

FINDINGS: Intrauterine gestational sac: Present.  Single.

Yolk sac:  Not visualized.

Embryo:  Not visualized.

Cardiac Activity: Not visualized.

MSD: 6.3 mm  mm   5 w   1  d

US EDC: [DATE]

Maternal uterus/adnexae: Small stable approximately 2 cm right
ovarian cyst, most likely corpus luteal cyst. Ovaries are otherwise
unremarkable. 13 mm right adnexal/ lower uterine solid
well-circumscribed density is again noted. This may represent a
small fibroid.
IMPRESSION: No interval appearance of fetal pole. Gestational sac again noted.
This is intrauterine. Differential diagnosis includes early
intrauterine pregnancy, anembryonic pregnancy, and ectopic
pregnancy. No ectopic pregnancy identified. Followup betahCG's and
pelvic ultrasound suggested.

## 2013-07-07 MED ORDER — METHOTREXATE INJECTION FOR WOMEN'S HOSPITAL
50.0000 mg/m2 | Freq: Once | INTRAMUSCULAR | Status: AC
  Administered 2013-07-07: 115 mg via INTRAMUSCULAR
  Filled 2013-07-07: qty 2.3

## 2013-07-07 NOTE — MAU Provider Note (Signed)
Chart reviewed and agree with management and plan.  

## 2013-07-07 NOTE — MAU Note (Signed)
Patient to MAU after ultrasound for follow up labs. Patient states she has slight pain off and on and has brown spotting with wiping.

## 2013-07-07 NOTE — MAU Provider Note (Signed)
Ms. ARASELI SHERRY is a 32 y.o. [id number] at [redacted]w[redacted]d who presents to MAU today after follow-up US. Patient had [place] that showed IUGS without YS or FP on 06/29/13. Patient then had follow-up quant hCG without appropriate rise and was scheduled to return for [place] today. Patient denies pain or bleeding today.   BP 120/85  Pulse 71  Temp(Src) 98.5 F (36.9 C) (Oral)  Resp 20  LMP 05/05/2013 GENERAL: Well-developed, well-nourished female in no acute distress.  HEENT: Normocephalic, atraumatic.   LUNGS: Effort normal HEART: Regular rate  SKIN: Warm, dry and without erythema PSYCH: Normal mood and affect  Results for orders placed during the hospital encounter of 07/07/13 (from the past 24 hour(s))  HCG, QUANTITATIVE, PREGNANCY     Status: Abnormal   Collection Time    07/07/13  8:57 AM      Result Value Range   hCG, Beta Chain, Quant, S 2263 (*) <5 mIU/mL  CBC     Status: Abnormal   Collection Time    07/07/13  8:57 AM      Result Value Range   WBC 7.3  4.0 - 10.5 K/uL   RBC 4.20  3.87 - 5.11 MIL/uL   Hemoglobin 10.9 (*) 12.0 - 15.0 g/dL   HCT 47.8 (*) 29.5 - 62.1 %   MCV 80.7  78.0 - 100.0 fL   MCH 26.0  26.0 - 34.0 pg   MCHC 32.2  30.0 - 36.0 g/dL   RDW 30.8  65.7 - 84.6 %   Platelets 218  150 - 400 K/uL  BUN     Status: None   Collection Time    07/07/13  8:57 AM      Result Value Range   BUN 9  6 - 23 mg/dL  CREATININE, SERUM     Status: Abnormal   Collection Time    07/07/13  8:57 AM      Result Value Range   Creatinine, Ser 1.02  0.50 - 1.10 mg/dL   GFR calc non Af Amer 73 (*) >90 mL/min   GFR calc Af Amer 85 (*) >90 mL/min  AST     Status: None   Collection Time    07/07/13  8:57 AM      Result Value Range   AST 24  0 - 37 U/L    US Ob Transvaginal  07/07/2013   CLINICAL DATA:  Pregnancy.  EXAM: TRANSVAGINAL OB ULTRASOUND  TECHNIQUE: Transvaginal ultrasound was performed for complete evaluation of the gestation as well as the maternal uterus, adnexal regions, and  pelvic cul-de-sac.  COMPARISON:  Ultrasound 06/29/2013 .  FINDINGS: Intrauterine gestational sac: Present.  Single.  Yolk sac:  Not visualized.  Embryo:  Not visualized.  Cardiac Activity: Not visualized.  MSD: 6.3 mm  mm   5 w   1  d  [place] EDC: 03/08/2014  Maternal uterus/adnexae: Small stable approximately 2 cm right ovarian cyst, most likely corpus luteal cyst. Ovaries are otherwise unremarkable. 13 mm right adnexal/ lower uterine solid well-circumscribed density is again noted. This may represent a small fibroid.  IMPRESSION: No interval appearance of fetal pole. Gestational sac again noted. This is intrauterine. Differential diagnosis includes early intrauterine pregnancy, anembryonic pregnancy, and ectopic pregnancy. No ectopic pregnancy identified. Followup betahCG's and pelvic ultrasound suggested.   Electronically Signed   By: Maisie Fus  Register   On: 07/07/2013 09:25    MDM Discussed patient with Dr. Shawnie Pons. Concern for ectopic based on quant hCG values Order  MTX if labs are WNL Discussed high suspicion for ectopic with patient. Discussed that this is not a normally progressing pregnancy. Patient agrees to take MTX  A: Abnormal developing pregnancy, possible ectopic highly suspicious  P: Discharge home MTX care after instructions given  Ectopic/bleeding precautions discussed Patient to return on Monday for day #4 labs Patient to return to MAU sooner as needed or if her condition were to change or worsen  Freddi Starr, PA-C 07/07/2013 1:32 PM

## 2013-07-10 ENCOUNTER — Inpatient Hospital Stay (HOSPITAL_COMMUNITY)
Admission: AD | Admit: 2013-07-10 | Discharge: 2013-07-10 | Disposition: A | Discharge status: Home / Self Care | Payer: Medicaid Other | Source: Ambulatory Visit | Attending: Obstetrics & Gynecology | Admitting: Obstetrics & Gynecology

## 2013-07-10 DIAGNOSIS — O00109 Unspecified tubal pregnancy without intrauterine pregnancy: Secondary | ICD-10-CM | POA: Insufficient documentation

## 2013-07-10 DIAGNOSIS — O009 Unspecified ectopic pregnancy without intrauterine pregnancy: Secondary | ICD-10-CM

## 2013-07-10 LAB — HCG, QUANTITATIVE, PREGNANCY: hCG, Beta Chain, Quant, S: 2128 m[IU]/mL — ABNORMAL HIGH (ref <5)

## 2013-07-10 NOTE — MAU Note (Signed)
Patient to MAU for follow up BHCG. Patient states she is having light bleeding with no pain.

## 2013-07-10 NOTE — MAU Provider Note (Signed)
History     CSN: [id number]  Arrival date and time: 07/10/13 1616   None     Chief Complaint  Patient presents with  . Follow-up   HPI This is a 32 y.o. female who presents for followup HCG today, S/P Methotrexate.  Reports just light bleeding, no pain.   Previous MAU Note: [name] is a 32 y.o. [id number] at [redacted]w[redacted]d who presents to MAU today after follow-up US. Patient had [place] that showed IUGS without YS or FP on 06/29/13. Patient then had follow-up quant hCG without appropriate rise and was scheduled to return for [place] today. Patient denies pain or bleeding today.  MDM  Discussed patient with Dr. Shawnie Pons. Concern for ectopic based on quant hCG values  Order MTX if labs are WNL  Discussed high suspicion for ectopic with patient. Discussed that this is not a normally progressing pregnancy. Patient agrees to take MTX  A:  Abnormal developing pregnancy, possible ectopic highly suspicious  P:  Discharge home  MTX care after instructions given  Ectopic/bleeding precautions discussed  Patient to return on Monday for day #4 labs  Patient to return to MAU sooner as needed or if her condition were to change or worsen  Freddi Starr, PA-C  07/07/2013 1:32 PM      OB History   Grav Para Term Preterm Abortions TAB SAB Ect Mult Living   11 7 7  3  3  2 9       Past Medical History  Diagnosis Date  . Anemia   . Depression     pp with all deliveries no meds  . Hx of chlamydia infection   . Hx of gonorrhea   . Trichomonas contact, treated   . Blood transfusion without reported diagnosis     2 units after 2011 delivery    Past Surgical History  Procedure Laterality Date  . Dilation and curettage of uterus    . Wisdom tooth extraction      Family History  Problem Relation Age of Onset  . Adopted: Yes  . Anesthesia problems Neg Hx   . Hypotension Neg Hx   . Malignant hyperthermia Neg Hx   . Pseudochol deficiency Neg Hx     History  Substance Use Topics  .  Smoking status: Never Smoker   . Smokeless tobacco: Never Used  . Alcohol Use: No    Allergies:  Allergies  Allergen Reactions  . Latex Anaphylaxis and Hives  . Mushroom Extract Complex Anaphylaxis    No prescriptions prior to admission    Review of Systems  Constitutional: Negative for fever, chills and malaise/fatigue.  Gastrointestinal: Negative for nausea, vomiting, abdominal pain, diarrhea and constipation.  Genitourinary:       Light vaginal bleeding  Neurological: Negative for dizziness.   Physical Exam   Blood pressure 121/59, pulse 79, temperature 98.6 F (37 C), temperature source Oral, resp. rate 20, last menstrual period 05/05/2013, SpO2 100.00%.  Physical Exam  Constitutional: She is oriented to person, place, and time. She appears well-developed and well-nourished. No distress.  Cardiovascular: Normal rate.   Respiratory: Effort normal.  Musculoskeletal: Normal range of motion.  Neurological: She is alert and oriented to person, place, and time.  Skin: Skin is warm and dry.  Psychiatric: She has a normal mood and affect.    MAU Course  Procedures  MDM  Results for Sarah, [name] (MRN [id number]) as of 07/11/2013 15:45  Ref. Range 07/02/2013 00:57 07/07/2013 08:51 07/07/2013 08:57  07/10/2013 16:30  hCG, Beta Chain, Quant, S Latest Range: <5 mIU/mL 1778 (H)  2263 (H) 2128 (H)    US Ob Comp Less 14 Wks  06/29/2013   CLINICAL DATA:  Abdominal pain.  Pregnant.  EXAM: TRANSVAGINAL OB ULTRASOUND; OBSTETRIC <14 WK ULTRASOUND  TECHNIQUE: Transvaginal ultrasound was performed for complete evaluation of the gestation as well as the maternal uterus, adnexal regions, and pelvic cul-de-sac.  COMPARISON:  None.  FINDINGS: Intrauterine gestational sac: Present, single.  Yolk sac:  Not visualized  Embryo:  Not visualized  Cardiac Activity: Not visualized  MSD: 5.3  mm   5 w   2  d  [place] EDC: 02/27/2014  Maternal uterus/adnexae: 18 x 22 mm right ovarian cyst or follicle.  There is a separate 11 x 13 mm peripherally calcified right adnexal nodule. The endometrium is heterogeneous in echotexture. There is a small amount of free pelvic fluid.    IMPRESSION: 1. 5 week 2 day probable early intrauterine gestational sac, but no yolk sac, fetal pole, or cardiac activity yet visualized. Recommend follow-up quantitative B-HCG levels and follow-up US in 14 days to confirm and assess viability.   This recommendation follows SRU consensus guidelines: Diagnostic Criteria for Nonviable Pregnancy Early in the First Trimester. Malva Limes Med 2013; 161:0960-45.   Electronically Signed   By: Oley Balm M.D.   On: 06/29/2013 12:31   US Ob Transvaginal  07/07/2013   CLINICAL DATA:  Pregnancy.  EXAM: TRANSVAGINAL OB ULTRASOUND  TECHNIQUE: Transvaginal ultrasound was performed for complete evaluation of the gestation as well as the maternal uterus, adnexal regions, and pelvic cul-de-sac.  COMPARISON:  Ultrasound 06/29/2013 .  FINDINGS: Intrauterine gestational sac: Present.  Single.  Yolk sac:  Not visualized.  Embryo:  Not visualized.  Cardiac Activity: Not visualized.  MSD: 6.3 mm  mm   5 w   1  d  [place] EDC: 03/08/2014  Maternal uterus/adnexae: Small stable approximately 2 cm right ovarian cyst, most likely corpus luteal cyst. Ovaries are otherwise unremarkable. 13 mm right adnexal/ lower uterine solid well-circumscribed density is again noted. This may represent a small fibroid.    IMPRESSION: No interval appearance of fetal pole. Gestational sac again noted. This is intrauterine. Differential diagnosis includes early intrauterine pregnancy, anembryonic pregnancy, and ectopic pregnancy. No ectopic pregnancy identified. Followup betahCG's and pelvic ultrasound suggested.     Electronically Signed   By: Maisie Fus  Register   On: 07/07/2013 09:25    Assessment and Plan  A:  Day 4 s/p Methotrexate Followup       Quant HCG decreasing slightly   P:  Discussed results with patient       Follow  up for Day 7 labs       Ectopic precautions  [facility] 07/10/2013, 6:38 PM

## 2013-07-13 ENCOUNTER — Inpatient Hospital Stay (HOSPITAL_COMMUNITY)
Admission: AD | Admit: 2013-07-13 | Discharge: 2013-07-13 | Disposition: A | Discharge status: Home / Self Care | Payer: Medicaid Other | Source: Ambulatory Visit | Attending: Obstetrics & Gynecology | Admitting: Obstetrics & Gynecology

## 2013-07-13 DIAGNOSIS — O00109 Unspecified tubal pregnancy without intrauterine pregnancy: Secondary | ICD-10-CM | POA: Insufficient documentation

## 2013-07-13 DIAGNOSIS — O009 Unspecified ectopic pregnancy without intrauterine pregnancy: Secondary | ICD-10-CM

## 2013-07-13 NOTE — MAU Note (Signed)
Patient denies any pain or discomfort.

## 2013-07-13 NOTE — MAU Provider Note (Signed)
[name] is a 32 y.o. [id number] at [redacted]w[redacted]d who presents to MAU today for follow-up labs. The patient was given MTX on 07/07/13. Today she reports continued off and on bleeding without pain or fever.   BP 135/66  Pulse 89  Temp(Src) 98.3 F (36.8 C) (Oral)  Resp 18  Ht 5\' 3"  (1.6 m)  Wt 271 lb 2 oz (122.981 kg)  BMI 48.04 kg/m2  LMP 05/05/2013 GENERAL: Well-developed, well-nourished female in no acute distress.  HEENT: Normocephalic, atraumatic.   LUNGS: Effort normal HEART: Regular rate  SKIN: Warm, dry and without erythema PSYCH: Normal mood and affect  Results for TERAN, DAUGHENBAUGH (MRN [id number]) as of 07/13/2013 19:47  Ref. Range 07/07/2013 08:57 07/10/2013 16:30 07/13/2013 18:28  hCG, Beta Chain, Quant, S Latest Range: <5 mIU/mL 2263 (H) 2128 (H) 1343 (H)    A: Appropriate drop in quant hCG after MTX  P: Discharge home Bleeding precautions discussed Patient referred to [facility] clinic for follow-up labs in 1 week Patient may return to MAU as needed or if her condition were to change or worsen  Freddi Starr, PA-C 07/13/2013 7:48 PM

## 2013-07-20 ENCOUNTER — Other Ambulatory Visit: Payer: Medicaid Other

## 2013-07-21 NOTE — MAU Provider Note (Signed)
Attestation of Attending Supervision of Advanced Practitioner (CNM/NP): Evaluation and management procedures were performed by the Advanced Practitioner under my supervision and collaboration. I have reviewed the Advanced Practitioner's note and chart, and I agree with the management and plan.  Sarah [name] H. 10:28 AM

## 2013-08-22 ENCOUNTER — Emergency Department (HOSPITAL_COMMUNITY)
Admission: EM | Admit: 2013-08-22 | Discharge: 2013-08-23 | Discharge status: Against Medical Advice | Payer: Medicaid Other | Attending: Emergency Medicine | Admitting: Emergency Medicine

## 2013-08-22 ENCOUNTER — Encounter (HOSPITAL_COMMUNITY): Payer: Self-pay | Admitting: Emergency Medicine

## 2013-08-22 DIAGNOSIS — R110 Nausea: Secondary | ICD-10-CM | POA: Insufficient documentation

## 2013-08-22 NOTE — ED Notes (Signed)
Patient is alert and oriented x3.  She is complaining of an intermittent dizzy spell that started tonight at 6pm. Patient denies any history of this issue.  She denies any pain but states she is nauseated.

## 2013-08-23 NOTE — ED Notes (Signed)
Called to take to treatment room  No response noted from lobby 

## 2014-02-20 ENCOUNTER — Emergency Department (HOSPITAL_COMMUNITY)
Admission: EM | Admit: 2014-02-20 | Discharge: 2014-02-20 | Disposition: A | Discharge status: Home / Self Care | Payer: Medicaid Other | Attending: Emergency Medicine | Admitting: Emergency Medicine

## 2014-02-20 ENCOUNTER — Emergency Department (HOSPITAL_COMMUNITY): Payer: Medicaid Other

## 2014-02-20 ENCOUNTER — Encounter (HOSPITAL_COMMUNITY): Payer: Self-pay | Admitting: Emergency Medicine

## 2014-02-20 DIAGNOSIS — O9989 Other specified diseases and conditions complicating pregnancy, childbirth and the puerperium: Secondary | ICD-10-CM | POA: Insufficient documentation

## 2014-02-20 DIAGNOSIS — K219 Gastro-esophageal reflux disease without esophagitis: Secondary | ICD-10-CM | POA: Diagnosis not present

## 2014-02-20 DIAGNOSIS — Z862 Personal history of diseases of the blood and blood-forming organs and certain disorders involving the immune mechanism: Secondary | ICD-10-CM | POA: Insufficient documentation

## 2014-02-20 DIAGNOSIS — Z8619 Personal history of other infectious and parasitic diseases: Secondary | ICD-10-CM | POA: Insufficient documentation

## 2014-02-20 DIAGNOSIS — Z91040 Latex allergy status: Secondary | ICD-10-CM | POA: Insufficient documentation

## 2014-02-20 DIAGNOSIS — N949 Unspecified condition associated with female genital organs and menstrual cycle: Secondary | ICD-10-CM | POA: Diagnosis not present

## 2014-02-20 DIAGNOSIS — Z8659 Personal history of other mental and behavioral disorders: Secondary | ICD-10-CM | POA: Diagnosis not present

## 2014-02-20 DIAGNOSIS — Z3490 Encounter for supervision of normal pregnancy, unspecified, unspecified trimester: Secondary | ICD-10-CM

## 2014-02-20 LAB — URINALYSIS, ROUTINE W REFLEX MICROSCOPIC
Bilirubin Urine: NEGATIVE
Glucose, UA: NEGATIVE mg/dL
HGB URINE DIPSTICK: NEGATIVE
Ketones, ur: NEGATIVE mg/dL
Leukocytes, UA: NEGATIVE
Nitrite: NEGATIVE
PROTEIN: NEGATIVE mg/dL
SPECIFIC GRAVITY, URINE: 1.008 (ref 1.005–1.030)
Urobilinogen, UA: 1 mg/dL (ref 0.0–1.0)
pH: 6 (ref 5.0–8.0)

## 2014-02-20 LAB — WET PREP, GENITAL
CLUE CELLS WET PREP: NONE SEEN
Trich, Wet Prep: NONE SEEN
YEAST WET PREP: NONE SEEN

## 2014-02-20 LAB — CBC WITH DIFFERENTIAL/PLATELET
Basophils Absolute: 0 10*3/uL (ref 0.0–0.1)
Basophils Relative: 0 % (ref 0–1)
EOS PCT: 5 % (ref 0–5)
Eosinophils Absolute: 0.4 10*3/uL (ref 0.0–0.7)
HEMATOCRIT: 32.6 % — AB (ref 36.0–46.0)
Hemoglobin: 10.7 g/dL — ABNORMAL LOW (ref 12.0–15.0)
LYMPHS PCT: 33 % (ref 12–46)
Lymphs Abs: 2.7 10*3/uL (ref 0.7–4.0)
MCH: 26.5 pg (ref 26.0–34.0)
MCHC: 32.8 g/dL (ref 30.0–36.0)
MCV: 80.7 fL (ref 78.0–100.0)
MONO ABS: 0.7 10*3/uL (ref 0.1–1.0)
Monocytes Relative: 8 % (ref 3–12)
Neutro Abs: 4.4 10*3/uL (ref 1.7–7.7)
Neutrophils Relative %: 54 % (ref 43–77)
Platelets: 228 10*3/uL (ref 150–400)
RBC: 4.04 MIL/uL (ref 3.87–5.11)
RDW: 15 % (ref 11.5–15.5)
WBC: 8.3 10*3/uL (ref 4.0–10.5)

## 2014-02-20 LAB — COMPREHENSIVE METABOLIC PANEL
ALT: 21 U/L (ref 0–35)
AST: 24 U/L (ref 0–37)
Albumin: 3.3 g/dL — ABNORMAL LOW (ref 3.5–5.2)
Alkaline Phosphatase: 68 U/L (ref 39–117)
Anion gap: 11 (ref 5–15)
BUN: 6 mg/dL (ref 6–23)
CALCIUM: 9.6 mg/dL (ref 8.4–10.5)
CO2: 23 meq/L (ref 19–32)
CREATININE: 0.95 mg/dL (ref 0.50–1.10)
Chloride: 101 mEq/L (ref 96–112)
GFR, EST NON AFRICAN AMERICAN: 79 mL/min — AB (ref >90)
Glucose, Bld: 114 mg/dL — ABNORMAL HIGH (ref 70–99)
Potassium: 3.5 mEq/L — ABNORMAL LOW (ref 3.7–5.3)
SODIUM: 135 meq/L — AB (ref 137–147)
Total Bilirubin: 0.2 mg/dL — ABNORMAL LOW (ref 0.3–1.2)
Total Protein: 7.7 g/dL (ref 6.0–8.3)

## 2014-02-20 LAB — I-STAT TROPONIN, ED: TROPONIN I, POC: 0 ng/mL (ref 0.00–0.08)

## 2014-02-20 LAB — PREGNANCY, URINE: Preg Test, Ur: POSITIVE — AB

## 2014-02-20 LAB — LIPASE, BLOOD: Lipase: 50 U/L (ref 11–59)

## 2014-02-20 LAB — HCG, QUANTITATIVE, PREGNANCY: HCG, BETA CHAIN, QUANT, S: 4875 m[IU]/mL — AB (ref <5)

## 2014-02-20 MED ORDER — METOCLOPRAMIDE HCL 10 MG PO TABS: 10.0000 mg | ORAL_TABLET | Freq: Four times a day (QID) | ORAL | Status: DC

## 2014-02-20 NOTE — Discharge Instructions (Signed)
Prenatal Care  WHAT IS PRENATAL CARE?  Prenatal care means health care during your pregnancy, before your baby is born. It is very important to take care of yourself and your baby during your pregnancy by:   Getting early prenatal care. If you know you are pregnant, or think you might be pregnant, call your health care provider as soon as possible. Schedule a visit for a prenatal exam.  Getting regular prenatal care. Follow your health care provider's schedule for blood and other necessary tests. Do not miss appointments.  Doing everything you can to keep yourself and your baby healthy during your pregnancy.  Getting complete care. Prenatal care should include evaluation of the medical, dietary, educational, psychological, and social needs of you and your significant other. The medical and genetic history of your family and the family of your baby's father should be discussed with your health care provider.  Discussing with your health care provider:  Prescription, over-the-counter, and herbal medicines that you take.  Any history of substance abuse, alcohol use, smoking, and illegal drug use.  Any history of domestic abuse and violence.  Immunizations you have received.  Your nutrition and diet.  The amount of exercise you do.  Any environmental and occupational hazards to which you are exposed.  History of sexually transmitted infections for both you and your partner.  Previous pregnancies you have had. WHY IS PRENATAL CARE SO IMPORTANT?  By regularly seeing your health care provider, you help ensure that problems can be identified early so that they can be treated as soon as possible. Other problems might be prevented. Many studies have shown that early and regular prenatal care is important for the health of mothers and their babies.  HOW CAN I TAKE CARE OF MYSELF WHILE I AM PREGNANT?  Here are ways to take care of yourself and your baby:   Start or continue taking your  multivitamin with 400 micrograms (mcg) of folic acid every day.  Get early and regular prenatal care. It is very important to see a health care provider during your pregnancy. Your health care provider will check at each visit to make sure that you and your baby are healthy. If there are any problems, action can be taken right away to help you and your baby.  Eat a healthy diet that includes:  Fruits.  Vegetables.  Foods low in saturated fat.  Whole grains.  Calcium-rich foods, such as milk, yogurt, and hard cheeses.  Drink 6-8 glasses of liquids a day.  Unless your health care provider tells you not to, try to be physically active for 30 minutes, most days of the week. If you are pressed for time, you can get your activity in through 10-minute segments, three times a day.  Do not smoke, drink alcohol, or use drugs. These can cause long-term damage to your baby. Talk with your health care provider about steps to take to stop smoking. Talk with a member of your faith community, a counselor, a trusted friend, or your health care provider if you are concerned about your alcohol or drug use.  Ask your health care provider before taking any medicine, even over-the-counter medicines. Some medicines are not safe to take during pregnancy.  Get plenty of rest and sleep.  Avoid hot tubs and saunas during pregnancy.  Do not have X-rays taken unless absolutely necessary and with the recommendation of your health care provider. A lead shield can be placed on your abdomen to protect your baby when  X-rays are taken in other parts of your body. °· Do not empty the cat litter when you are pregnant. It may contain a parasite that causes an infection called toxoplasmosis, which can cause birth defects. Also, use gloves when working in garden areas used by cats. °· Do not eat uncooked or undercooked meats or fish. °· Do not eat soft, mold-ripened cheeses (Brie, Camembert, and chevre) or soft, blue-veined  cheese (Danish blue and Roquefort). °· Stay away from toxic chemicals like: °¨ Insecticides. °¨ Solvents (some cleaners or paint thinners). °¨ Lead. °¨ Mercury. °· Sexual intercourse may continue until the end of the pregnancy, unless you have a medical problem or there is a problem with the pregnancy and your health care provider tells you not to. °· Do not wear high-heel shoes, especially during the second half of the pregnancy. You can lose your balance and fall. °· Do not take long trips, unless absolutely necessary. Be sure to see your health care provider before going on the trip. °· Do not sit in one position for more than 2 hours when on a trip. °· Take a copy of your medical records when going on a trip. Know where a hospital is located in the city you are visiting, in case of an emergency. °· Most dangerous household products will have pregnancy warnings on their labels. Ask your health care provider about products if you are unsure. °· Limit or eliminate your caffeine intake from coffee, tea, sodas, medicines, and chocolate. °· Many women continue working through pregnancy. Staying active might help you stay healthier. If you have a question about the safety or the hours you work at your particular job, talk with your health care provider. °· Get informed: °¨ Read books. °¨ Watch videos. °¨ Go to childbirth classes for you and your significant other. °¨ Talk with experienced moms. °· Ask your health care provider about childbirth education classes for you and your partner. Classes can help you and your partner prepare for the birth of your baby. °· Ask about a baby doctor (pediatrician) and methods and pain medicine for labor, delivery, and possible cesarean delivery. °HOW OFTEN SHOULD I SEE MY HEALTH CARE PROVIDER DURING PREGNANCY?  °Your health care provider will give you a schedule for your prenatal visits. You will have visits more often as you get closer to the end of your pregnancy. An average  pregnancy lasts about 40 weeks.  °A typical schedule includes visiting your health care provider:  °· About once each month during your first 6 months of pregnancy. °· Every 2 weeks during the next 2 months. °· Weekly in the last month, until the delivery date. °Your health care provider will probably want to see you more often if: °· You are older than 35 years. °· Your pregnancy is high risk because you have certain health problems or problems with the pregnancy, such as: °¨ Diabetes. °¨ High blood pressure. °¨ The baby is not growing on schedule, according to the dates of the pregnancy. °Your health care provider will do special tests to make sure you and your baby are not having any serious problems. °WHAT HAPPENS DURING PRENATAL VISITS?  °· At your first prenatal visit, your health care provider will do a physical exam and talk to you about your health history and the health history of your partner and your family. Your health care provider will be able to tell you what date to expect your baby to be born on. °· Your   first physical exam will include checks of your blood pressure, measurements of your height and weight, and an exam of your pelvic organs. Your health care provider will do a Pap test if you have not had one recently and will do cultures of your cervix to make sure there is no infection.  At each prenatal visit, there will be tests of your blood, urine, blood pressure, weight, and the progress of the baby will be checked.  At your later prenatal visits, your health care provider will check how you are doing and how your baby is developing. You may have a number of tests done as your pregnancy progresses.  Ultrasound exams are often used to check on your baby's growth and health.  You may have more urine and blood tests, as well as special tests, if needed. These may include amniocentesis to examine fluid in the pregnancy sac, stress tests to check how the baby responds to contractions, or a  biophysical profile to measure your baby's well-being. Your health care provider will explain the tests and why they are necessary.  You should be tested for high blood sugar (gestational diabetes) between the 24th and 28th weeks of your pregnancy.  You should discuss with your health care provider your plans to breastfeed or bottle-feed your baby.  Each visit is also a chance for you to learn about staying healthy during pregnancy and to ask questions. Document Released: 07/23/2003 Document Revised: 07/25/2013 Document Reviewed: 01/05/2013 [employer] Patient Information 2015 [place], [place]. This information is not intended to replace advice given to you by your health care provider. Make sure you discuss any questions you have with your health care provider. Heartburn During Pregnancy  Heartburn is a burning sensation in the chest caused by stomach acid backing up into the esophagus. Heartburn is common in pregnancy because a certain hormone (progesterone) is released when a woman is pregnant. The progesterone hormone may relax the valve that separates the esophagus from the stomach. This allows acid to go up into the esophagus, causing heartburn. Heartburn may also happen in pregnancy because the enlarging uterus pushes up on the stomach, which pushes more acid into the esophagus. This is especially true in the later stages of pregnancy. Heartburn problems usually go away after giving birth. CAUSES  Heartburn is caused by stomach acid backing up into the esophagus. During pregnancy, this may result from various things, including:   The progesterone hormone.  Changing hormone levels.  The growing uterus pushing stomach acid upward.  Large meals.  Certain foods and drinks.  Exercise.  Increased acid production. SIGNS AND SYMPTOMS   Burning pain in the chest or lower throat.  Bitter taste in the mouth.  Coughing. DIAGNOSIS  Your health care provider will typically diagnose heartburn  by taking a careful history of your concern. Blood tests may be done to check for a certain type of bacteria that is associated with heartburn. Sometimes, heartburn is diagnosed by prescribing a heartburn medicine to see if the symptoms improve. In some cases, a procedure called an endoscopy may be done. In this procedure, a tube with a light and a camera on the end (endoscope) is used to examine the esophagus and the stomach. TREATMENT  Treatment will vary depending on the severity of your symptoms. Your health care provider may recommend:  Over-the-counter medicines (antacids, acid reducers) for mild heartburn.  Prescription medicines to decrease stomach acid or to protect your stomach lining.  Certain changes in your diet.  Elevating the  head of your bed by putting blocks under the legs. This helps prevent stomach acid from backing up into the esophagus when you are lying down. HOME CARE INSTRUCTIONS   Only take over-the-counter or prescription medicines as directed by your health care provider.  Raise the head of your bed by putting blocks under the legs if instructed to do so by your health care provider. Sleeping with more pillows is not effective because it only changes the position of your head.  Do not exercise right after eating.  Avoid eating 2-3 hours before bed. Do not lie down right after eating.  Eat small meals throughout the day instead of three large meals.  Identify foods and beverages that make your symptoms worse and avoid them. Foods you may want to avoid include:  Peppers.  Chocolate.  High-fat foods, including fried foods.  Spicy foods.  Garlic and onions.  Citrus fruits, including oranges, grapefruit, lemons, and limes.  Food containing tomatoes or tomato products.  Mint.  Carbonated and caffeinated drinks.  Vinegar. SEEK MEDICAL CARE IF:  You have abdominal pain of any kind.  You feel burning in your upper abdomen or chest, especially after  eating or lying down.  You have nausea and vomiting.  Your stomach feels upset after you eat. SEEK IMMEDIATE MEDICAL CARE IF:   You have severe chest pain that goes down your arm or into your jaw or neck.  You feel sweaty, dizzy, or light-headed.  You become short of breath.  You vomit blood.  You have difficulty or pain with swallowing.  You have bloody or black, tarry stools.  You have episodes of heartburn more than 3 times a week, for more than 2 weeks. MAKE SURE YOU:  Understand these instructions.  Will watch your condition.  Will get help right away if you are not doing well or get worse. Document Released: 07/17/2000 Document Revised: 07/25/2013 Document Reviewed: 03/08/2013 [facility] Patient Information 2015 [place], [place]. This information is not intended to replace advice given to you by your health care provider. Make sure you discuss any questions you have with your health care provider.

## 2014-02-20 NOTE — ED Notes (Signed)
Patient transported to Ultrasound 

## 2014-02-20 NOTE — ED Notes (Signed)
Pt states that she began having upper abd pain / pressure that radiates down to her lower abd and lower back; pt denies N/V/D; pt states that she thought it was gas r/t her cycle but the pain has persisted and she is concerned

## 2014-02-20 NOTE — ED Provider Notes (Signed)
CSN: [id number]     Arrival date & time 02/20/14  0127 History   First MD Initiated Contact with Patient 02/20/14 0257     Chief Complaint  Patient presents with  . Abdominal Pain    (Consider location/radiation/quality/duration/timing/severity/associated sxs/prior Treatment) HPI Comments: Patient is a 33 y/o [id number] female who presents to the ED today for epigastric pain. Pain is burning and intermittent and began a few days ago. Epigastric pain sporadically travels up into her chest. Patient states she has also had an aching abdominal pain in her low abdomen which radiates around to her low back b/l. Patient states lower abdominal ache has been fairly constant x 4 days. Patient has tried Beano, Gas-X, and a mixture of vinegar and honey for her epigastric pain. She denies any improvement with this. No known worsening with food intake. No hx of abdominal surgeries. Patient denies fever, CP, SOB, N/V, hematemesis, melena, hematochezia, diarrhea, urinary symptoms, vaginal bleeding, vaginal discharge, and syncope.  Patient is a 33 y.o. female presenting with abdominal pain. The history is provided by the patient. No language interpreter was used.  Abdominal Pain Associated symptoms: no nausea, no vaginal bleeding, no vaginal discharge and no vomiting     Past Medical History  Diagnosis Date  . Anemia   . Depression     pp with all deliveries no meds  . Hx of chlamydia infection   . Hx of gonorrhea   . Trichomonas contact, treated   . Blood transfusion without reported diagnosis     2 units after 2011 delivery   Past Surgical History  Procedure Laterality Date  . Dilation and curettage of uterus    . Wisdom tooth extraction     Family History  Problem Relation Age of Onset  . Adopted: Yes  . Anesthesia problems Neg Hx   . Hypotension Neg Hx   . Malignant hyperthermia Neg Hx   . Pseudochol deficiency Neg Hx    History  Substance Use Topics  . Smoking status: Never Smoker   .  Smokeless tobacco: Never Used  . Alcohol Use: No   OB History   Grav Para Term Preterm Abortions TAB SAB Ect Mult Living   11 7 7  3  3  2 9       Review of Systems  Gastrointestinal: Positive for abdominal pain. Negative for nausea and vomiting.  Genitourinary: Positive for pelvic pain. Negative for vaginal bleeding and vaginal discharge.  All other systems reviewed and are negative.     Allergies  Latex and Mushroom extract complex  Home Medications   Prior to Admission medications   Medication Sig Start Date End Date Taking? Authorizing Provider  metoCLOPramide (REGLAN) 10 MG tablet Take 1 tablet (10 mg total) by mouth every 6 (six) hours. 02/20/14   Antony Madura, PA-C   BP 135/67  Pulse 76  Temp(Src) 98.7 F (37.1 C) (Oral)  Resp 18  Ht 5\' 4"  (1.626 m)  Wt 268 lb (121.564 kg)  BMI 45.98 kg/m2  SpO2 100%  LMP 01/15/2014  Physical Exam  Nursing note and vitals reviewed. Constitutional: She is oriented to person, place, and time. She appears well-developed and well-nourished. No distress.  Nontoxic/nonseptic appearing  HENT:  Head: Normocephalic and atraumatic.  Eyes: Conjunctivae and EOM are normal. No scleral icterus.  Neck: Normal range of motion.  Cardiovascular: Normal rate, regular rhythm and normal heart sounds.   Pulmonary/Chest: Effort normal and breath sounds normal. No respiratory distress. She has no wheezes. She  has no rales.  Chest expansion symmetric  Abdominal: Soft. There is no tenderness. There is no rebound and no guarding.  Soft morbidly obese abdomen without focal tenderness, guarding, or peritoneal signs.  Genitourinary: Vagina normal. There is no rash, tenderness or lesion on the right labia. There is no rash, tenderness or lesion on the left labia. Uterus is not tender. Cervix exhibits no motion tenderness and no friability. Right adnexum displays no mass, no tenderness and no fullness. Left adnexum displays no mass, no tenderness and no  fullness.  Musculoskeletal: Normal range of motion.  Neurological: She is alert and oriented to person, place, and time. She exhibits normal muscle tone. Coordination normal.  GCS 15. Patient moves extremities without ataxia.  Skin: Skin is warm and dry. No rash noted. She is not diaphoretic. No erythema. No pallor.  Psychiatric: She has a normal mood and affect. Her behavior is normal.    ED Course  Procedures (including critical care time) Labs Review Labs Reviewed  WET PREP, GENITAL - Abnormal; Notable for the following:    WBC, Wet Prep HPF POC FEW (*)    All other components within normal limits  CBC WITH DIFFERENTIAL - Abnormal; Notable for the following:    Hemoglobin 10.7 (*)    HCT 32.6 (*)    All other components within normal limits  COMPREHENSIVE METABOLIC PANEL - Abnormal; Notable for the following:    Sodium 135 (*)    Potassium 3.5 (*)    Glucose, Bld 114 (*)    Albumin 3.3 (*)    Total Bilirubin 0.2 (*)    GFR calc non Af Amer 79 (*)    All other components within normal limits  PREGNANCY, URINE - Abnormal; Notable for the following:    Preg Test, Ur POSITIVE (*)    All other components within normal limits  URINALYSIS, ROUTINE W REFLEX MICROSCOPIC - Abnormal; Notable for the following:    Color, Urine GREEN (*)    All other components within normal limits  HCG, QUANTITATIVE, PREGNANCY - Abnormal; Notable for the following:    hCG, Beta Chain, Quant, S 4875 (*)    All other components within normal limits  GC/CHLAMYDIA PROBE AMP  LIPASE, BLOOD  I-STAT TROPOININ, ED    Imaging Review [place] Abdomen Complete  02/20/2014   CLINICAL DATA:  epigastric pain  EXAM: ULTRASOUND ABDOMEN COMPLETE  COMPARISON:  None.  FINDINGS: Gallbladder:  No gallstones or wall thickening visualized. No sonographic Murphy sign noted.  Common bile duct:  Diameter: 3.6 mm  Liver:  No focal lesion identified. Within normal limits in parenchymal echogenicity.  IVC:  No abnormality visualized.   Pancreas:  Visualized portion unremarkable.  Spleen:  Size and appearance within normal limits.  Right Kidney:  Length: 11.6 cm. Echogenicity within normal limits. No mass or hydronephrosis visualized.  Left Kidney:  Length: 12.2 cm. Echogenicity within normal limits. No mass or hydronephrosis visualized.  Abdominal aorta:  No aneurysm visualized.  Other findings:  None.  IMPRESSION: Normal abdominal ultrasound.   Electronically Signed   By: Rise MuBenjamin  McClintock M.D.   On: 02/20/2014 04:58   [place] Ob Comp Less 14 Wks  02/20/2014   CLINICAL DATA:  Pelvic pain.  EXAM: OBSTETRIC <14 WK US AND TRANSVAGINAL OB US  TECHNIQUE: Both transabdominal and transvaginal ultrasound examinations were performed for complete evaluation of the gestation as well as the maternal uterus, adnexal regions, and pelvic cul-de-sac. Transvaginal technique was performed to assess early pregnancy.  COMPARISON:  None.  FINDINGS: Intrauterine gestational sac: Visualized/normal in shape.  Yolk sac:  Yes  Embryo:  No  Cardiac Activity: N/A  MSD:  9.0 mm   5 w   5  d  Maternal uterus/adnexae: No subchorionic hemorrhage is seen. The uterus is otherwise unremarkable in appearance  The ovaries are within normal limits. The right ovary measures 3.4 x 2.2 x 2.0 cm, while the left ovary measures 2.5 x 1.7 x 2.3 cm. No suspicious adnexal masses are seen; there is no evidence for ovarian torsion.  No free fluid is seen within the pelvic cul-de-sac.  IMPRESSION: Single intrauterine gestational sac noted, with a mean sac diameter of 0.9 cm, corresponding to a gestational age of [redacted] weeks 5 days. This matches the gestational age of [redacted] weeks 1 day by LMP, reflecting an estimated date of delivery of October 22, 2014. The embryo is not yet visualized.   Electronically Signed   By: Roanna Raider M.D.   On: 02/20/2014 05:04   US Ob Transvaginal  02/20/2014   CLINICAL DATA:  Pelvic pain.  EXAM: OBSTETRIC <14 WK [place] AND TRANSVAGINAL OB US  TECHNIQUE: Both  transabdominal and transvaginal ultrasound examinations were performed for complete evaluation of the gestation as well as the maternal uterus, adnexal regions, and pelvic cul-de-sac. Transvaginal technique was performed to assess early pregnancy.  COMPARISON:  None.  FINDINGS: Intrauterine gestational sac: Visualized/normal in shape.  Yolk sac:  Yes  Embryo:  No  Cardiac Activity: N/A  MSD:  9.0 mm   5 w   5  d  Maternal uterus/adnexae: No subchorionic hemorrhage is seen. The uterus is otherwise unremarkable in appearance  The ovaries are within normal limits. The right ovary measures 3.4 x 2.2 x 2.0 cm, while the left ovary measures 2.5 x 1.7 x 2.3 cm. No suspicious adnexal masses are seen; there is no evidence for ovarian torsion.  No free fluid is seen within the pelvic cul-de-sac.  IMPRESSION: Single intrauterine gestational sac noted, with a mean sac diameter of 0.9 cm, corresponding to a gestational age of [redacted] weeks 5 days. This matches the gestational age of [redacted] weeks 1 day by LMP, reflecting an estimated date of delivery of October 22, 2014. The embryo is not yet visualized.   Electronically Signed   By: Roanna Raider M.D.   On: 02/20/2014 05:04     EKG Interpretation   Date/Time:  Tuesday February 20 2014 02:03:17 EDT Ventricular Rate:  73 PR Interval:  150 QRS Duration: 90 QT Interval:  394 QTC Calculation: 434 R Axis:   13 Text Interpretation:  Sinus rhythm Borderline T abnormalities, diffuse  leads No significant change was found Confirmed by CAMPOS  MD, KEVIN  ([id number]) on 02/20/2014 3:14:50 AM      MDM   Final diagnoses:  Gastroesophageal reflux disease, esophagitis presence not specified  Intrauterine pregnancy    33 year old [id number] female presents to the ED for suprapubic/pelvic pain and epigastric pain. Patient found to be pregnant following arrival. LMP on 01/15/14. Patient well and nontoxic appearing, hemodynamically stable, and afebrile. Patient with no leukocytosis, worsening  anemia, or electrolyte imbalance. Liver and kidney function preserved. Lipase normal and troponin 0.00. Pelvic exam completed given pregnancy which was without significant findings.  Pain further evaluated with abdominal ultrasound and pelvic ultrasound. Abdominal ultrasound without evidence of cholelithiasis or cholecystitis. Pelvic ultrasound shows a single intrauterine gestational sac consistent with intrauterine pregnancy of 5 weeks, 5 days. Physical exam without abdominal tenderness to palpation.  This has remained stable on reexamination over ED course.   Epigastric symptoms likely secondary to esophageal reflux. This may be independent of patient's pregnancy, though patient does endorse a hx of GERD with prior pregnancies. Do not believe further emergent workup is indicated at this time. Patient stable for discharge with instruction for outpatient prenatal care and OB/GYN follow up. Return precautions discussed and provided. Patient agreeable to plan with no unaddressed concerns.   Filed Vitals:   02/20/14 0146  BP: 135/67  Pulse: 76  Temp: 98.7 F (37.1 C)  TempSrc: Oral  Resp: 18  Height: 5\' 4"  (1.626 m)  Weight: 268 lb (121.564 kg)  SpO2: 100%     Antony Madura, PA-C 02/20/14 [phone]

## 2014-02-20 NOTE — ED Notes (Signed)
Bed: WA05 Expected date:  Expected time:  Means of arrival:  Comments: 

## 2014-02-20 NOTE — ED Provider Notes (Signed)
Medical screening examination/treatment/procedure(s) were performed by non-physician practitioner and as supervising physician I was immediately available for consultation/collaboration.   EKG Interpretation   Date/Time:  Tuesday February 20 2014 02:03:17 EDT Ventricular Rate:  73 PR Interval:  150 QRS Duration: 90 QT Interval:  394 QTC Calculation: 434 R Axis:   13 Text Interpretation:  Sinus rhythm Borderline T abnormalities, diffuse  leads No significant change was found Confirmed by Eain Mullendore  MD, Sunita Demond  ([id number]) on 02/20/2014 3:14:50 AM        Lyanne CoKevin M Liala Codispoti, MD 02/20/14 [phone]

## 2014-02-21 LAB — GC/CHLAMYDIA PROBE AMP
CT Probe RNA: NEGATIVE
GC Probe RNA: NEGATIVE

## 2014-02-27 LAB — OB RESULTS CONSOLE HIV ANTIBODY (ROUTINE TESTING): HIV: NONREACTIVE

## 2014-02-27 LAB — OB RESULTS CONSOLE RPR: RPR: NONREACTIVE

## 2014-02-27 LAB — OB RESULTS CONSOLE GC/CHLAMYDIA
CHLAMYDIA, DNA PROBE: NEGATIVE
GC PROBE AMP, GENITAL: NEGATIVE

## 2014-02-27 LAB — OB RESULTS CONSOLE ABO/RH: RH TYPE: POSITIVE

## 2014-02-27 LAB — OB RESULTS CONSOLE ANTIBODY SCREEN: ANTIBODY SCREEN: NEGATIVE

## 2014-02-27 LAB — OB RESULTS CONSOLE RUBELLA ANTIBODY, IGM: Rubella: IMMUNE

## 2014-02-27 LAB — OB RESULTS CONSOLE HEPATITIS B SURFACE ANTIGEN: Hepatitis B Surface Ag: NEGATIVE

## 2014-03-05 ENCOUNTER — Inpatient Hospital Stay (HOSPITAL_COMMUNITY)
Admission: AD | Admit: 2014-03-05 | Discharge: 2014-03-06 | Disposition: A | Discharge status: Home / Self Care | Payer: Medicaid Other | Source: Ambulatory Visit | Attending: Obstetrics | Admitting: Obstetrics

## 2014-03-05 DIAGNOSIS — R109 Unspecified abdominal pain: Secondary | ICD-10-CM | POA: Insufficient documentation

## 2014-03-06 ENCOUNTER — Encounter (HOSPITAL_COMMUNITY): Payer: Self-pay | Admitting: *Deleted

## 2014-03-06 DIAGNOSIS — R109 Unspecified abdominal pain: Secondary | ICD-10-CM | POA: Diagnosis not present

## 2014-03-06 NOTE — MAU Note (Signed)
Not in lobby

## 2014-03-06 NOTE — MAU Note (Signed)
Pt reports she is having stomach pain for the last 3 days, taking tylenol but not helping.

## 2014-03-09 ENCOUNTER — Inpatient Hospital Stay (HOSPITAL_COMMUNITY)
Admission: AD | Admit: 2014-03-09 | Discharge: 2014-03-09 | Disposition: A | Discharge status: Home / Self Care | Payer: Medicaid Other | Source: Ambulatory Visit | Attending: Obstetrics | Admitting: Obstetrics

## 2014-03-09 ENCOUNTER — Encounter (HOSPITAL_COMMUNITY): Payer: Self-pay

## 2014-03-09 DIAGNOSIS — O99891 Other specified diseases and conditions complicating pregnancy: Secondary | ICD-10-CM | POA: Diagnosis not present

## 2014-03-09 DIAGNOSIS — O9989 Other specified diseases and conditions complicating pregnancy, childbirth and the puerperium: Principal | ICD-10-CM

## 2014-03-09 DIAGNOSIS — O0000 Abdominal pregnancy without intrauterine pregnancy: Secondary | ICD-10-CM

## 2014-03-09 DIAGNOSIS — O3671X1 Maternal care for viable fetus in abdominal pregnancy, first trimester, fetus 1: Secondary | ICD-10-CM

## 2014-03-09 DIAGNOSIS — R109 Unspecified abdominal pain: Secondary | ICD-10-CM | POA: Diagnosis present

## 2014-03-09 DIAGNOSIS — O3090 Multiple gestation, unspecified, unspecified trimester: Secondary | ICD-10-CM

## 2014-03-09 LAB — URINALYSIS, ROUTINE W REFLEX MICROSCOPIC
Bilirubin Urine: NEGATIVE
GLUCOSE, UA: NEGATIVE mg/dL
KETONES UR: NEGATIVE mg/dL
Leukocytes, UA: NEGATIVE
Nitrite: NEGATIVE
Protein, ur: NEGATIVE mg/dL
Specific Gravity, Urine: <1.005 — ABNORMAL LOW (ref 1.005–1.030)
Urobilinogen, UA: 0.2 mg/dL (ref 0.0–1.0)
pH: 6.5 (ref 5.0–8.0)

## 2014-03-09 LAB — URINE MICROSCOPIC-ADD ON

## 2014-03-09 MED ORDER — CONCEPT OB 130-92.4-1 MG PO CAPS: 1.0000 | ORAL_CAPSULE | ORAL | Status: DC

## 2014-03-09 NOTE — MAU Provider Note (Signed)
Chief Complaint: Abdominal Cramping   First provider contact at 1235   SUBJECTIVE HPI: Sarah [name] is a 33 y.o. [id number] Brand39 at 4739w4d by LMP who presents with intermittent lower abdominal cramping. Called to make her new OB appointment and was told to come here. No vaginal bleeding. No nausea vomiting or breast tenderness. Denies dysuria, hematuria, frequency or urgency of urination. No irritative vaginal discharge. Seen MAU 02/20/14 for cramping. GC/CT, WP neg. IUP confirmed.   Past Medical History  Diagnosis Date  . Anemia   . Depression     pp with all deliveries no meds  . Hx of chlamydia infection   . Hx of gonorrhea   . Trichomonas contact, treated   . Blood transfusion without reported diagnosis     2 units after 2011 delivery   OB History  Gravida Para Term Preterm AB SAB TAB Ectopic Multiple Living  12 7 7  3 3   2 9     # Outcome Date GA Lbr Len/2nd Weight Sex Delivery Anes PTL Lv  12 CUR           11 TRM 05/14/12 3740w2d 08:51 / 00:24 2.48 kg (5 lb 7.5 oz) F SVD EPI  Y  10A TRM 03/2010 7255w1d  1.984 kg (4 lb 6 oz) F SVD   Y  10B  03/2010 5155w1d  2.325 kg (5 lb 2 oz) F SVD   Y  9A TRM 01/2009 5364w0d  2.438 kg (5 lb 6 oz) M SVD   Y  9B  01/2009 4964w0d  1.984 kg (4 lb 6 oz) M SVD   Y  8 SAB 05/2007 2743w0d         7 TRM 08/2006 1264w0d  3.742 kg (8 lb 4 oz) M SVD   Y  6 TRM 12/2004 4743w0d  2.835 kg (6 lb 4 oz) M SVD   Y  5 TRM 10/2002 2443w0d  2.438 kg (5 lb 6 oz) F SVD   Y  4 TRM 2002 1867w0d  2.438 kg (5 lb 6 oz) M SVD   Y  3 SAB 2000          2 SAB 1998          1 GRA              Comments: System Generated. Please review and update pregnancy details.     Past Surgical History  Procedure Laterality Date  . Dilation and curettage of uterus    . Wisdom tooth extraction    . Vaginal delivery     History   Social History  . Marital Status: Married    Spouse Name: N/A    Number of Children: N/A  . Years of Education: N/A   Occupational History  . Not on file.   Social  History Main Topics  . Smoking status: Never Smoker   . Smokeless tobacco: Never Used  . Alcohol Use: No  . Drug Use: No  . Sexual Activity: Yes    Birth Control/ Protection: None     Comment: pregnant   Other Topics Concern  . Not on file   Social History Narrative  . No narrative on file   No current facility-administered medications on file prior to encounter.   No current outpatient prescriptions on file prior to encounter.   Allergies  Allergen Reactions  . Latex Anaphylaxis and Hives  . Mushroom Extract Complex Anaphylaxis    ROS: Pertinent items in HPI  OBJECTIVE Blood pressure  135/70, pulse 70, temperature 99.1 F (37.3 C), temperature source Oral, resp. rate 18, height 5' 3.5" (1.613 m), weight 121.473 kg (267 lb 12.8 oz), last menstrual period 01/15/2014, SpO2 100.00%, unknown if currently breastfeeding. GENERAL: Obese female in no acute distress.  HEENT: Normocephalic HEART: normal rate RESP: normal effort ABDOMEN: Soft, non-tender EXTREMITIES: Nontender, no edema NEURO: Alert and oriented   LAB RESULTS Results for orders placed during the hospital encounter of 03/09/14 (from the past 24 hour(s))  URINALYSIS, ROUTINE W REFLEX MICROSCOPIC     Status: Abnormal   Collection Time    03/09/14 11:35 AM      Result Value Ref Range   Color, Urine YELLOW  YELLOW   APPearance CLEAR  CLEAR   Specific Gravity, Urine <1.005 (*) 1.005 - 1.030   pH 6.5  5.0 - 8.0   Glucose, UA NEGATIVE  NEGATIVE mg/dL   Hgb urine dipstick TRACE (*) NEGATIVE   Bilirubin Urine NEGATIVE  NEGATIVE   Ketones, ur NEGATIVE  NEGATIVE mg/dL   Protein, ur NEGATIVE  NEGATIVE mg/dL   Urobilinogen, UA 0.2  0.0 - 1.0 mg/dL   Nitrite NEGATIVE  NEGATIVE   Leukocytes, UA NEGATIVE  NEGATIVE  URINE MICROSCOPIC-ADD ON     Status: None   Collection Time    03/09/14 11:35 AM      Result Value Ref Range   Squamous Epithelial / LPF RARE  RARE   WBC, UA 0-2  <3 WBC/hpf   RBC / HPF 0-2  <3 RBC/hpf     IMAGING US Abdomen Complete  02/20/2014   CLINICAL DATA:  epigastric pain  EXAM: ULTRASOUND ABDOMEN COMPLETE  COMPARISON:  None.  FINDINGS: Gallbladder:  No gallstones or wall thickening visualized. No sonographic Murphy sign noted.  Common bile duct:  Diameter: 3.6 mm  Liver:  No focal lesion identified. Within normal limits in parenchymal echogenicity.  IVC:  No abnormality visualized.  Pancreas:  Visualized portion unremarkable.  Spleen:  Size and appearance within normal limits.  Right Kidney:  Length: 11.6 cm. Echogenicity within normal limits. No mass or hydronephrosis visualized.  Left Kidney:  Length: 12.2 cm. Echogenicity within normal limits. No mass or hydronephrosis visualized.  Abdominal aorta:  No aneurysm visualized.  Other findings:  None.  IMPRESSION: Normal abdominal ultrasound.   Electronically Signed   By: Rise Mu M.D.   On: 02/20/2014 04:58   MAU COURSE  Bedside US by me: Normal fetal cardiac activity seen  ASSESSMENT 1. Viable fetus in abdominal pregnancy, first trimester, fetus 1   [id number] @ [redacted]w[redacted]d   PLAN Discharge home with reassureance May use Tylenol for pain   Medication List         acetaminophen 325 MG tablet  Commonly known as:  TYLENOL  Take 325 mg by mouth every 6 (six) hours as needed for mild pain or headache.     prenatal multivitamin Tabs tablet  Take 1 tablet by mouth daily at 12 noon.       Follow-up Information   Follow up with Kathreen Cosier, MD On 03/27/2014.   Specialty:  Obstetrics and Gynecology   Contact information:   [address] [place] [address] [phone]       Danae Orleans, CNM 03/09/2014  12:38 PM

## 2014-03-09 NOTE — Discharge Instructions (Signed)

## 2014-03-09 NOTE — MAU Note (Signed)
Patient states she has been having cramping for several days after her son (150lb)  fell on her abdomen on 8-4. Denies bleeding or discharge, no nausea or vomiting.

## 2014-06-04 ENCOUNTER — Encounter (HOSPITAL_COMMUNITY): Payer: Self-pay

## 2014-08-03 NOTE — L&D Delivery Note (Signed)
Delivery Note At 8:43 PM a viable female was delivered via Vaginal, Vacuum (Extractor) (Presentation: ;  ).  APGAR: , ; weight  .   Placenta status: , .  Cord:  with the following complications: .  Cord pH: not done  Anesthesia: Epidural  Episiotomy:   Lacerations:   Suture Repair: 2.0 Est. Blood Loss (mL):    Mom to postpartum.  Baby to Couplet care / Skin to Skin.  Lavell Supple A 10/20/2014, 8:53 PM

## 2014-08-27 ENCOUNTER — Other Ambulatory Visit (HOSPITAL_COMMUNITY): Payer: Self-pay | Admitting: Obstetrics

## 2014-08-27 DIAGNOSIS — O365933 Maternal care for other known or suspected poor fetal growth, third trimester, fetus 3: Secondary | ICD-10-CM

## 2014-08-27 DIAGNOSIS — Z3A32 32 weeks gestation of pregnancy: Secondary | ICD-10-CM

## 2014-08-27 DIAGNOSIS — Z3689 Encounter for other specified antenatal screening: Secondary | ICD-10-CM

## 2014-08-30 ENCOUNTER — Ambulatory Visit (HOSPITAL_COMMUNITY)
Admission: RE | Admit: 2014-08-30 | Discharge: 2014-08-30 | Disposition: A | Discharge status: Home / Self Care | Payer: Medicaid Other | Source: Ambulatory Visit | Attending: Obstetrics | Admitting: Obstetrics

## 2014-08-30 ENCOUNTER — Encounter (HOSPITAL_COMMUNITY): Payer: Self-pay

## 2014-08-30 DIAGNOSIS — Z3689 Encounter for other specified antenatal screening: Secondary | ICD-10-CM

## 2014-08-30 DIAGNOSIS — O99213 Obesity complicating pregnancy, third trimester: Secondary | ICD-10-CM | POA: Insufficient documentation

## 2014-08-30 DIAGNOSIS — O365933 Maternal care for other known or suspected poor fetal growth, third trimester, fetus 3: Secondary | ICD-10-CM

## 2014-08-30 DIAGNOSIS — Z36 Encounter for antenatal screening of mother: Secondary | ICD-10-CM | POA: Insufficient documentation

## 2014-08-30 DIAGNOSIS — Z3A32 32 weeks gestation of pregnancy: Secondary | ICD-10-CM | POA: Diagnosis not present

## 2014-08-30 DIAGNOSIS — O365990 Maternal care for other known or suspected poor fetal growth, unspecified trimester, not applicable or unspecified: Secondary | ICD-10-CM | POA: Insufficient documentation

## 2014-08-30 DIAGNOSIS — Z0374 Encounter for suspected problem with fetal growth ruled out: Secondary | ICD-10-CM | POA: Insufficient documentation

## 2014-09-13 LAB — OB RESULTS CONSOLE GBS: STREP GROUP B AG: POSITIVE

## 2014-09-13 LAB — OB RESULTS CONSOLE GC/CHLAMYDIA
Chlamydia: NEGATIVE
Gonorrhea: NEGATIVE

## 2014-10-18 ENCOUNTER — Inpatient Hospital Stay (HOSPITAL_COMMUNITY)
Admission: AD | Admit: 2014-10-18 | Discharge: 2014-10-18 | Disposition: A | Discharge status: Home / Self Care | Payer: Medicaid Other | Source: Ambulatory Visit | Attending: Obstetrics | Admitting: Obstetrics

## 2014-10-18 ENCOUNTER — Inpatient Hospital Stay (HOSPITAL_COMMUNITY): Payer: Medicaid Other

## 2014-10-18 ENCOUNTER — Encounter (HOSPITAL_COMMUNITY): Payer: Self-pay

## 2014-10-18 ENCOUNTER — Telehealth (HOSPITAL_COMMUNITY): Payer: Self-pay | Admitting: *Deleted

## 2014-10-18 DIAGNOSIS — O0943 Supervision of pregnancy with grand multiparity, third trimester: Secondary | ICD-10-CM | POA: Diagnosis not present

## 2014-10-18 DIAGNOSIS — O368190 Decreased fetal movements, unspecified trimester, not applicable or unspecified: Secondary | ICD-10-CM | POA: Insufficient documentation

## 2014-10-18 DIAGNOSIS — IMO0002 Reserved for concepts with insufficient information to code with codable children: Secondary | ICD-10-CM

## 2014-10-18 DIAGNOSIS — Z3A39 39 weeks gestation of pregnancy: Secondary | ICD-10-CM | POA: Diagnosis not present

## 2014-10-18 DIAGNOSIS — O3090 Multiple gestation, unspecified, unspecified trimester: Secondary | ICD-10-CM

## 2014-10-18 DIAGNOSIS — O368131 Decreased fetal movements, third trimester, fetus 1: Secondary | ICD-10-CM

## 2014-10-18 DIAGNOSIS — O368130 Decreased fetal movements, third trimester, not applicable or unspecified: Secondary | ICD-10-CM | POA: Diagnosis present

## 2014-10-18 NOTE — MAU Note (Signed)
Sent from office for NST, c/o decreased fetal movement.  No pain, bleeding or leaking. No problems with preg

## 2014-10-18 NOTE — MAU Provider Note (Signed)
History     CSN: [id number]  Arrival date and time: 10/18/14 1112   First Provider Initiated Contact with Patient 10/18/14 1238      Chief Complaint  Patient presents with  . Decreased Fetal Movement   HPI This is a 34 y.o. female at 3265w3d who presents with c/o decreased fetal movement today. Denies leaking or bleeding. Does feel occasional contractions.   OB History    Gravida Para Term Preterm AB TAB SAB Ectopic Multiple Living   12 7 7  3  3  2 9       Past Medical History  Diagnosis Date  . Anemia   . Depression     pp with all deliveries no meds  . Hx of chlamydia infection   . Hx of gonorrhea   . Trichomonas contact, treated   . Blood transfusion without reported diagnosis     2 units after 2011 delivery    Past Surgical History  Procedure Laterality Date  . Dilation and curettage of uterus    . Wisdom tooth extraction    . Vaginal delivery      Family History  Problem Relation Age of Onset  . Adopted: Yes  . Anesthesia problems Neg Hx   . Hypotension Neg Hx   . Malignant hyperthermia Neg Hx   . Pseudochol deficiency Neg Hx     History  Substance Use Topics  . Smoking status: Never Smoker   . Smokeless tobacco: Never Used  . Alcohol Use: No    Allergies:  Allergies  Allergen Reactions  . Latex Anaphylaxis and Hives  . Mushroom Extract Complex Anaphylaxis    Prescriptions prior to admission  Medication Sig Dispense Refill Last Dose  . Prenatal Vit-Fe Fumarate-FA (PRENATAL MULTIVITAMIN) TABS tablet Take 1 tablet by mouth daily at 12 noon.   10/17/2014 at Unknown time  . acetaminophen (TYLENOL) 325 MG tablet Take 325 mg by mouth every 6 (six) hours as needed for mild pain or headache.   prn    Review of Systems  Constitutional: Negative for fever and chills.  Eyes: Negative for blurred vision.  Gastrointestinal: Negative for nausea, vomiting and abdominal pain.  Musculoskeletal: Negative for myalgias.  Neurological: Negative for dizziness.    Psychiatric/Behavioral: Negative for depression.   Physical Exam   Blood pressure 151/90, pulse 73, temperature 98.8 F (37.1 C), temperature source Oral, resp. rate 18, height 5\' 4"  (1.626 m), weight 280 lb (127.007 kg), last menstrual period 01/15/2014, unknown if currently breastfeeding.  Physical Exam  Constitutional: She is oriented to person, place, and time. She appears well-developed and well-nourished. No distress.  HENT:  Head: Normocephalic.  Cardiovascular: Normal rate.   Respiratory: Effort normal.  GI: Soft. She exhibits no distension. There is no tenderness. There is no rebound and no guarding.  Musculoskeletal: Normal range of motion.  Neurological: She is alert and oriented to person, place, and time.  Skin: Skin is warm and dry.  Psychiatric: She has a normal mood and affect.    MAU Course  Procedures  MDM Nonstress test reactive. Baseline FHR 130s with good variability. There were a few small variable decels to 120s for 20-30 seconds. So I ordered a BPP.   Assessment and Plan  A:  SIUP at 3265w3d        Grand Multipara        Reactive NST, small variable decels        BPP 8/8  P:  Discussed with Dr Gaynell FaceMarshall  Discharge home       Kick counts       Return for IOL as scheduled   [facility] 10/18/2014, 1:18 PM

## 2014-10-18 NOTE — Telephone Encounter (Signed)
Preadmission screen  

## 2014-10-18 NOTE — Discharge Instructions (Signed)
Fetal Biophysical Profile °This is a test that measures five different variables of the fetus: Heart rate, breathing movement, total movement of the baby, fetal muscle tone, the amount of amniotic fluid, and the heart rate activity of the fetus. The five variables are measured individually and contribute either a 2 or a 0 to the overall scoring of the test. The measurements are as follows: °· Fetal heart rate activity. This is measured and scored in the same way as a non-stress test. The fetal heart rate is considered reactive when there are movement-associated fetal heart rate increases of at least 15 beats per minute above baseline, and 15 seconds in duration over a 20-minute period. A score of 2 is given for reactivity, and a score of 0 indicates that the fetal heart rate is non-reactive. °· Fetal breathing movements. This is scored based on fetal breathing movements and indicate fetal well-being. Their absence may indicate a low oxygen level for the fetus. Fetal breathing increases in frequency and uniformity after the 36th week of pregnancy. To earn a score of 2, the fetus must have at least one episode of fetal breathing lasting at least 60 seconds within a 30-minute observation. Absence of this breathing is scored a 0 on the BPP. °· Fetal body movements. Fetal activity is a reflection of brain integrity and function. The presence of at least three episodes of fetal movements within a 30-minute period is given a score of 2. A score of 0 is given with two or less movements in this time period. Fetal activity is highest 1 to 3 hours after the mother has eaten a meal. °· Fetal tone. In the uterus, the fetus is normally in a position of flexion. This means the head is bent down towards the knees. The fetus also stretches, rolls, and moves in the uterus. The arms, legs, trunk, and head may be flexed and extended. A score of 2 is earned when there is at least one episode of active extension with return flexion. A  score of 0 is given for slow extension with a return to only partial flexion. Fetal movement not followed by return to flexion, limbs or spine in extension, and an open fetal hand score 0. °· Amniotic fluid volume. Amniotic fluid volume has been demonstrated to be a good method of predicting fetal distress. Too little amniotic fluid has been associated with fetal abnormalities, slow uterine growth, and over due pregnancy. A score of 2 is given for this when there is at least one pocket of amniotic fluid that measures 1 cm in a specific area. A score of 0 indicates either that fluid is absent in most areas of the uterine cavity or that the largest pocket of fluid measures less than 1 cm. °PREPARATION FOR TEST °No preparation or fasting is necessary. °NORMAL FINDINGS °· A score of 8-10 points (if amniotic fluid volume is adequate). °· Possible critical values: Less than 4 may necessitate immediate delivery of fetus. °Ranges for normal findings may vary among different laboratories and hospitals. You should always check with your doctor after having lab work or other tests done to discuss the meaning of your test results and whether your values are considered within normal limits. °MEANING OF TEST  °Your caregiver will go over the test results with you and discuss the importance and meaning of your results, as well as treatment options and the need for additional tests if necessary. °OBTAINING THE TEST RESULTS  °It is your responsibility to obtain your test   results. Ask the lab or department performing the test when and how you will get your results. °Document Released: 11/20/2004 Document Revised: 10/12/2011 Document Reviewed: 06/29/2008 °ExitCare® Patient Information ©2015 [employer]. This information is not intended to replace advice given to you by your health care provider. Make sure you discuss any questions you have with your health care provider. ° °

## 2014-10-20 ENCOUNTER — Inpatient Hospital Stay (HOSPITAL_COMMUNITY)
Admission: RE | Admit: 2014-10-20 | Discharge: 2014-10-22 | DRG: 775 | Disposition: A | Discharge status: Home / Self Care | Payer: Medicaid Other | Source: Ambulatory Visit | Attending: Obstetrics | Admitting: Obstetrics

## 2014-10-20 ENCOUNTER — Inpatient Hospital Stay (HOSPITAL_COMMUNITY): Payer: Medicaid Other | Admitting: Anesthesiology

## 2014-10-20 ENCOUNTER — Encounter (HOSPITAL_COMMUNITY): Payer: Self-pay

## 2014-10-20 DIAGNOSIS — O99824 Streptococcus B carrier state complicating childbirth: Secondary | ICD-10-CM | POA: Diagnosis present

## 2014-10-20 DIAGNOSIS — Z3A39 39 weeks gestation of pregnancy: Secondary | ICD-10-CM | POA: Diagnosis present

## 2014-10-20 DIAGNOSIS — Z3480 Encounter for supervision of other normal pregnancy, unspecified trimester: Secondary | ICD-10-CM

## 2014-10-20 DIAGNOSIS — Z3483 Encounter for supervision of other normal pregnancy, third trimester: Secondary | ICD-10-CM | POA: Diagnosis present

## 2014-10-20 LAB — CBC
HCT: 33.3 % — ABNORMAL LOW (ref 36.0–46.0)
HEMOGLOBIN: 11.1 g/dL — AB (ref 12.0–15.0)
MCH: 28.8 pg (ref 26.0–34.0)
MCHC: 33.3 g/dL (ref 30.0–36.0)
MCV: 86.3 fL (ref 78.0–100.0)
Platelets: 155 10*3/uL (ref 150–400)
RBC: 3.86 MIL/uL — AB (ref 3.87–5.11)
RDW: 13.3 % (ref 11.5–15.5)
WBC: 6.4 10*3/uL (ref 4.0–10.5)

## 2014-10-20 LAB — PREPARE RBC (CROSSMATCH)

## 2014-10-20 MED ORDER — PHENYLEPHRINE 40 MCG/ML (10ML) SYRINGE FOR IV PUSH (FOR BLOOD PRESSURE SUPPORT)
80.0000 ug | PREFILLED_SYRINGE | INTRAVENOUS | Status: DC | PRN
  Filled 2014-10-20: qty 20
  Filled 2014-10-20: qty 2

## 2014-10-20 MED ORDER — FENTANYL 2.5 MCG/ML BUPIVACAINE 1/10 % EPIDURAL INFUSION (WH - ANES): 14.0000 mL/h | INTRAMUSCULAR | Status: DC | PRN

## 2014-10-20 MED ORDER — LIDOCAINE HCL (PF) 1 % IJ SOLN
30.0000 mL | INTRAMUSCULAR | Status: DC | PRN
  Filled 2014-10-20: qty 30

## 2014-10-20 MED ORDER — BUTORPHANOL TARTRATE 1 MG/ML IJ SOLN
1.0000 mg | INTRAMUSCULAR | Status: DC | PRN
  Administered 2014-10-20: 1 mg via INTRAVENOUS
  Filled 2014-10-20: qty 1

## 2014-10-20 MED ORDER — FENTANYL 2.5 MCG/ML BUPIVACAINE 1/10 % EPIDURAL INFUSION (WH - ANES)
INTRAMUSCULAR | Status: DC | PRN
  Administered 2014-10-20: 14 mL/h via EPIDURAL

## 2014-10-20 MED ORDER — EPHEDRINE 5 MG/ML INJ
10.0000 mg | INTRAVENOUS | Status: DC | PRN
Start: 2014-10-20 — End: 2014-10-20

## 2014-10-20 MED ORDER — LACTATED RINGERS IV SOLN: 500.0000 mL | Freq: Once | INTRAVENOUS | Status: DC

## 2014-10-20 MED ORDER — OXYTOCIN BOLUS FROM INFUSION
500.0000 mL | INTRAVENOUS | Status: DC
  Administered 2014-10-20: 500 mL via INTRAVENOUS

## 2014-10-20 MED ORDER — PHENYLEPHRINE 40 MCG/ML (10ML) SYRINGE FOR IV PUSH (FOR BLOOD PRESSURE SUPPORT): 80.0000 ug | PREFILLED_SYRINGE | INTRAVENOUS | Status: DC | PRN

## 2014-10-20 MED ORDER — PHENYLEPHRINE 40 MCG/ML (10ML) SYRINGE FOR IV PUSH (FOR BLOOD PRESSURE SUPPORT)
80.0000 ug | PREFILLED_SYRINGE | INTRAVENOUS | Status: DC | PRN
  Filled 2014-10-20: qty 2

## 2014-10-20 MED ORDER — DEXTROSE 5 % IV SOLN
5.0000 10*6.[IU] | Freq: Once | INTRAVENOUS | Status: AC
  Administered 2014-10-20: 5 10*6.[IU] via INTRAVENOUS
  Filled 2014-10-20: qty 5

## 2014-10-20 MED ORDER — ONDANSETRON HCL 4 MG/2ML IJ SOLN
4.0000 mg | Freq: Four times a day (QID) | INTRAMUSCULAR | Status: DC | PRN
  Administered 2014-10-20: 4 mg via INTRAVENOUS
  Filled 2014-10-20: qty 2

## 2014-10-20 MED ORDER — PENICILLIN G POTASSIUM 5000000 UNITS IJ SOLR
2.5000 10*6.[IU] | INTRAVENOUS | Status: DC
  Administered 2014-10-20 (×3): 2.5 10*6.[IU] via INTRAVENOUS
  Filled 2014-10-20 (×5): qty 2.5

## 2014-10-20 MED ORDER — OXYCODONE-ACETAMINOPHEN 5-325 MG PO TABS: 2.0000 | ORAL_TABLET | ORAL | Status: DC | PRN

## 2014-10-20 MED ORDER — FLEET ENEMA 7-19 GM/118ML RE ENEM: 1.0000 | ENEMA | RECTAL | Status: DC | PRN

## 2014-10-20 MED ORDER — LACTATED RINGERS IV SOLN
INTRAVENOUS | Status: DC
  Administered 2014-10-20: 1000 mL via INTRAVENOUS

## 2014-10-20 MED ORDER — DIPHENHYDRAMINE HCL 50 MG/ML IJ SOLN: 12.5000 mg | INTRAMUSCULAR | Status: DC | PRN

## 2014-10-20 MED ORDER — OXYTOCIN 40 UNITS IN LACTATED RINGERS INFUSION - SIMPLE MED
62.5000 mL/h | INTRAVENOUS | Status: DC
  Filled 2014-10-20: qty 1000

## 2014-10-20 MED ORDER — EPHEDRINE 5 MG/ML INJ
10.0000 mg | INTRAVENOUS | Status: DC | PRN
  Filled 2014-10-20: qty 2

## 2014-10-20 MED ORDER — SODIUM CHLORIDE 0.9 % IV SOLN
14.0000 mL/h | INTRAVENOUS | Status: DC | PRN
  Administered 2014-10-20: 14 mL/h via EPIDURAL
  Filled 2014-10-20 (×2): qty 17

## 2014-10-20 MED ORDER — LACTATED RINGERS IV SOLN
500.0000 mL | INTRAVENOUS | Status: DC | PRN
  Administered 2014-10-20: 1000 mL via INTRAVENOUS

## 2014-10-20 MED ORDER — OXYTOCIN 40 UNITS IN LACTATED RINGERS INFUSION - SIMPLE MED
1.0000 m[IU]/min | INTRAVENOUS | Status: DC
  Administered 2014-10-20: 4 m[IU]/min via INTRAVENOUS
  Administered 2014-10-20: 2 m[IU]/min via INTRAVENOUS

## 2014-10-20 MED ORDER — OXYCODONE-ACETAMINOPHEN 5-325 MG PO TABS: 1.0000 | ORAL_TABLET | ORAL | Status: DC | PRN

## 2014-10-20 MED ORDER — ACETAMINOPHEN 325 MG PO TABS: 650.0000 mg | ORAL_TABLET | ORAL | Status: DC | PRN

## 2014-10-20 MED ORDER — LIDOCAINE HCL (PF) 1 % IJ SOLN
INTRAMUSCULAR | Status: DC | PRN
  Administered 2014-10-20 (×2): 4 mL

## 2014-10-20 MED ORDER — EPHEDRINE 5 MG/ML INJ
10.0000 mg | INTRAVENOUS | Status: DC | PRN
Start: 2014-10-20 — End: 2014-10-20
  Filled 2014-10-20: qty 2

## 2014-10-20 MED ORDER — EPHEDRINE 5 MG/ML INJ: 10.0000 mg | INTRAVENOUS | Status: DC | PRN

## 2014-10-20 MED ORDER — CITRIC ACID-SODIUM CITRATE 334-500 MG/5ML PO SOLN: 30.0000 mL | ORAL | Status: DC | PRN

## 2014-10-20 MED ORDER — TERBUTALINE SULFATE 1 MG/ML IJ SOLN
0.2500 mg | Freq: Once | INTRAMUSCULAR | Status: DC | PRN
  Filled 2014-10-20: qty 1

## 2014-10-20 NOTE — H&P (Signed)
This is Dr. Francoise CeoBernard Phu Record dictating the history and physical on  Sarah [name] she is a 34 year old gravida 9 para 2 618 positive GBS EDC  On 21 your 3 desires induction cervix 2 cm vertex -2 and she's had 2 sets of twins Past medical history she received blood transfusion after delivery of her twins Past surgical history negative Social history negative System review apart from postpartum hemorrhage negative Physical exam well-developed female in early labor HEENT negative Lungs clear to P&A Heart regular rhythm no murmurs no gallops Breasts negative Abdomen term Pelvic as described above Extremities negative

## 2014-10-20 NOTE — Progress Notes (Signed)
Induction RN accompaniwed.

## 2014-10-20 NOTE — Anesthesia Procedure Notes (Signed)
Epidural Patient location during procedure: OB Start time: 10/20/2014 2:55 PM  Staffing Anesthesiologist: Karie SchwalbeJUDD, Sarah [name] Performed by: anesthesiologist   Preanesthetic Checklist Completed: patient identified, site marked, surgical consent, pre-op evaluation, timeout performed, IV checked, risks and benefits discussed and monitors and equipment checked  Epidural Patient position: sitting Prep: site prepped and draped and DuraPrep Patient monitoring: continuous pulse ox and blood pressure Approach: midline Location: L3-L4 Injection technique: LOR saline  Needle:  Needle type: Tuohy  Needle gauge: 17 G Needle length: 9 cm and 9 Needle insertion depth: 7 cm Catheter type: closed end flexible Catheter size: 19 Gauge Catheter at skin depth: 12 cm Test dose: negative  Assessment Events: blood not aspirated, injection not painful, no injection resistance, negative IV test and no paresthesia  Additional Notes Patient identified. Risks/Benefits/Options discussed with patient including but not limited to bleeding, infection, nerve damage, paralysis, failed block, incomplete pain control, headache, blood pressure changes, nausea, vomiting, reactions to medication both or allergic, itching and postpartum back pain. Confirmed with bedside nurse the patient's most recent platelet count. Confirmed with patient that they are not currently taking any anticoagulation, have any bleeding history or any family history of bleeding disorders. Patient expressed understanding and wished to proceed. All questions were answered. Sterile technique was used throughout the entire procedure. Please see nursing notes for vital signs. Test dose was given through epidural catheter and negative prior to continuing to dose epidural or start infusion. Warning signs of high block given to the patient including shortness of breath, tingling/numbness in hands, complete motor block, or any concerning symptoms with instructions to  call for help. Patient was given instructions on fall risk and not to get out of bed. All questions and concerns addressed with instructions to call with any issues or inadequate analgesia.

## 2014-10-20 NOTE — Anesthesia Preprocedure Evaluation (Signed)
Anesthesia Evaluation  Patient identified by MRN, date of birth, ID band Patient awake    Reviewed: Allergy & Precautions, NPO status , Patient's Chart, lab work & pertinent test results  History of Anesthesia Complications Negative for: history of anesthetic complications  Airway Mallampati: III  TM Distance: >3 FB Neck ROM: Full    Dental no notable dental hx. (+) Dental Advisory Given   Pulmonary neg pulmonary ROS,  breath sounds clear to auscultation  Pulmonary exam normal       Cardiovascular negative cardio ROS  Rhythm:Regular Rate:Normal     Neuro/Psych PSYCHIATRIC DISORDERS Anxiety Depression negative neurological ROS     GI/Hepatic negative GI ROS, Neg liver ROS,   Endo/Other  Morbid obesity  Renal/GU negative Renal ROS  negative genitourinary   Musculoskeletal negative musculoskeletal ROS (+)   Abdominal   Peds negative pediatric ROS (+)  Hematology  (+) anemia ,   Anesthesia Other Findings   Reproductive/Obstetrics (+) Pregnancy                             Anesthesia Physical Anesthesia Plan  ASA: III  Anesthesia Plan: Epidural   Post-op Pain Management:    Induction:   Airway Management Planned:   Additional Equipment:   Intra-op Plan:   Post-operative Plan:   Informed Consent: I have reviewed the patients History and Physical, chart, labs and discussed the procedure including the risks, benefits and alternatives for the proposed anesthesia with the patient or authorized representative who has indicated his/her understanding and acceptance.   Dental advisory given  Plan Discussed with:   Anesthesia Plan Comments:         Anesthesia Quick Evaluation

## 2014-10-20 NOTE — Progress Notes (Addendum)
20.6 ml bupivicaine wasted by Houston SirenAmanda Aashvi Rezabek, RN and witnessed by Darden Amberaroline Blackstock, RN.  Medication administered by previous shift; medication unable to be found in Pyxis in order to document waste.

## 2014-10-20 NOTE — Progress Notes (Signed)
This note also relates to the following rows which could not be included: Pulse Rate - Cannot attach notes to unvalidated device data SpO2 - Cannot attach notes to unvalidated device data   Dr Gentry RochJudd in room starting epidural pump

## 2014-10-21 LAB — TYPE AND SCREEN
ABO/RH(D): B POS
Antibody Screen: NEGATIVE
UNIT DIVISION: 0
UNIT DIVISION: 0

## 2014-10-21 LAB — CBC
HCT: 31.8 % — ABNORMAL LOW (ref 36.0–46.0)
Hemoglobin: 10.7 g/dL — ABNORMAL LOW (ref 12.0–15.0)
MCH: 29 pg (ref 26.0–34.0)
MCHC: 33.6 g/dL (ref 30.0–36.0)
MCV: 86.2 fL (ref 78.0–100.0)
Platelets: 163 10*3/uL (ref 150–400)
RBC: 3.69 MIL/uL — ABNORMAL LOW (ref 3.87–5.11)
RDW: 13.1 % (ref 11.5–15.5)
WBC: 9.1 10*3/uL (ref 4.0–10.5)

## 2014-10-21 LAB — HIV ANTIBODY (ROUTINE TESTING W REFLEX): HIV Screen 4th Generation wRfx: NONREACTIVE

## 2014-10-21 LAB — RPR: RPR Ser Ql: NONREACTIVE

## 2014-10-21 MED ORDER — ACETAMINOPHEN 325 MG PO TABS
650.0000 mg | ORAL_TABLET | ORAL | Status: DC | PRN
Start: 2014-10-21 — End: 2014-10-22

## 2014-10-21 MED ORDER — IBUPROFEN 600 MG PO TABS
600.0000 mg | ORAL_TABLET | Freq: Four times a day (QID) | ORAL | Status: DC
  Administered 2014-10-21 – 2014-10-22 (×6): 600 mg via ORAL
  Filled 2014-10-21 (×6): qty 1

## 2014-10-21 MED ORDER — BENZOCAINE-MENTHOL 20-0.5 % EX AERO
1.0000 "application " | INHALATION_SPRAY | CUTANEOUS | Status: DC | PRN
  Administered 2014-10-21 – 2014-10-22 (×2): 1 via TOPICAL
  Filled 2014-10-21 (×2): qty 56

## 2014-10-21 MED ORDER — TETANUS-DIPHTH-ACELL PERTUSSIS 5-2.5-18.5 LF-MCG/0.5 IM SUSP: 0.5000 mL | Freq: Once | INTRAMUSCULAR | Status: DC

## 2014-10-21 MED ORDER — ONDANSETRON HCL 4 MG PO TABS: 4.0000 mg | ORAL_TABLET | ORAL | Status: DC | PRN

## 2014-10-21 MED ORDER — LANOLIN HYDROUS EX OINT: TOPICAL_OINTMENT | CUTANEOUS | Status: DC | PRN

## 2014-10-21 MED ORDER — OXYCODONE-ACETAMINOPHEN 5-325 MG PO TABS
2.0000 | ORAL_TABLET | ORAL | Status: DC | PRN
  Administered 2014-10-22: 2 via ORAL
  Filled 2014-10-21: qty 2

## 2014-10-21 MED ORDER — ZOLPIDEM TARTRATE 5 MG PO TABS: 5.0000 mg | ORAL_TABLET | Freq: Every evening | ORAL | Status: DC | PRN

## 2014-10-21 MED ORDER — DIPHENHYDRAMINE HCL 25 MG PO CAPS: 25.0000 mg | ORAL_CAPSULE | Freq: Four times a day (QID) | ORAL | Status: DC | PRN

## 2014-10-21 MED ORDER — SIMETHICONE 80 MG PO CHEW: 80.0000 mg | CHEWABLE_TABLET | ORAL | Status: DC | PRN

## 2014-10-21 MED ORDER — DIBUCAINE 1 % RE OINT: 1.0000 "application " | TOPICAL_OINTMENT | RECTAL | Status: DC | PRN

## 2014-10-21 MED ORDER — PRENATAL MULTIVITAMIN CH
1.0000 | ORAL_TABLET | Freq: Every day | ORAL | Status: DC
  Administered 2014-10-21 – 2014-10-22 (×2): 1 via ORAL
  Filled 2014-10-21 (×2): qty 1

## 2014-10-21 MED ORDER — WITCH HAZEL-GLYCERIN EX PADS: 1.0000 "application " | MEDICATED_PAD | CUTANEOUS | Status: DC | PRN

## 2014-10-21 MED ORDER — OXYCODONE-ACETAMINOPHEN 5-325 MG PO TABS
1.0000 | ORAL_TABLET | ORAL | Status: DC | PRN
  Administered 2014-10-21 – 2014-10-22 (×2): 1 via ORAL
  Filled 2014-10-21 (×2): qty 1

## 2014-10-21 MED ORDER — ONDANSETRON HCL 4 MG/2ML IJ SOLN: 4.0000 mg | INTRAMUSCULAR | Status: DC | PRN

## 2014-10-21 MED ORDER — FERROUS SULFATE 325 (65 FE) MG PO TABS
325.0000 mg | ORAL_TABLET | Freq: Two times a day (BID) | ORAL | Status: DC
  Administered 2014-10-21 – 2014-10-22 (×3): 325 mg via ORAL
  Filled 2014-10-21 (×3): qty 1

## 2014-10-21 MED ORDER — SENNOSIDES-DOCUSATE SODIUM 8.6-50 MG PO TABS
2.0000 | ORAL_TABLET | ORAL | Status: DC
  Administered 2014-10-22: 2 via ORAL
  Filled 2014-10-21: qty 2

## 2014-10-21 NOTE — Lactation Note (Signed)
This note was copied from the chart of Sarah Rhyann Oommen. Lactation Consultation Note Initial visit at 20 hours of age.   Baby is mom's 10th, she has experience with breastfeeding and 12 months with her last.  All older siblings are now visiting in her room.  Baby is asleep in the crib.  Mom reports offering breast and formula with older child and understands supply and demand.  Mom reports intense contraction pain with breastfeeding.  Mom is taking medicine for pain and was encouraged to also empty her bladder prior to feedings.  Attempted hand expression, no colostrum noted at this time and encouraged mom to continue trying with every feeding.  [facility] resources given and discussed.  Encouraged to feed with early cues on demand.  Early newborn behavior discussed.  Mom to call for assist as needed.    Patient Name: Sarah [name] ZOXWR'UToday's Date: 10/21/2014 Reason for consult: Initial assessment   Maternal Data Has patient been taught Hand Expression?: Yes  Feeding Feeding Type: Breast Fed Length of feed: 10 min  LATCH Score/Interventions                Intervention(s): Breastfeeding basics reviewed     Lactation Tools Discussed/Used     Consult Status Consult Status: Follow-up Date: 10/22/14 Follow-up type: In-patient    Jannifer RodneyShoptaw, Dominic Rhome Lynn 10/21/2014, 5:04 PM
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
## 2014-10-21 NOTE — Anesthesia Postprocedure Evaluation (Signed)
Anesthesia Post Note  Patient: Sarah [name]  Procedure(s) Performed: * No procedures listed *  Anesthesia type: Epidural  Patient location: Mother/Baby  Post pain: Pain level controlled  Post assessment: Post-op Vital signs reviewed  Last Vitals:  Filed Vitals:   10/21/14 0508  BP: 130/82  Pulse: 68  Temp: 36.9 C  Resp: 18    Post vital signs: Reviewed  Level of consciousness:alert  Complications: No apparent anesthesia complications

## 2014-10-21 NOTE — Progress Notes (Signed)
Patient ID: Sarah BrandShanetta Vanduyn, female   DOB: Aug 17, 1980, 34 y.o.   MRN: [id number] Postpartum day one Vital signs normal Fundus firm Lochia moderate Doing well

## 2014-10-22 NOTE — Discharge Summary (Signed)
Obstetric Discharge Summary Reason for Admission: induction of labor Prenatal Procedures: none Intrapartum Procedures: spontaneous vaginal delivery Postpartum Procedures: none Complications-Operative and Postpartum: none HEMOGLOBIN  Date Value Ref Range Status  10/21/2014 10.7* 12.0 - 15.0 g/dL Final   HCT  Date Value Ref Range Status  10/21/2014 31.8* 36.0 - 46.0 % Final    Physical Exam:  General: alert Lochia: appropriate Uterine Fundus: firm Incision: healing well DVT Evaluation: No evidence of DVT seen on physical exam.  Discharge Diagnoses: Term Pregnancy-delivered  Discharge Information: Date: 10/22/2014 Activity: pelvic rest Diet: routine Medications: Percocet Condition: improved Instructions: refer to practice specific booklet Discharge to: home Follow-up Information    Follow up with Kathreen CosierMARSHALL,BERNARD A, MD.   Specialty:  Obstetrics and Gynecology   Contact information:   [address] [place] [address] [phone]       Newborn Data: Live born female  Birth Weight: 7 lb 1.8 oz (3226 g) APGAR: 8, 9  Home with mother.  MARSHALL,BERNARD A 10/22/2014, 6:19 AM

## 2014-10-22 NOTE — Lactation Note (Signed)
This note was copied from the chart of Sarah [name]. Lactation Consultation Note  Patient Name: Sarah [name] BrandShanetta Bhatt ZOXWR'UToday's Date: 10/22/2014 Reason for consult: Follow-up assessment Mom reports she is experienced BF, denies questions/concerns. She is breast/bottle feeding by choice. Encouraged to BF with each feeding to encourage milk production. Advised of OP services and support group.   Maternal Data    Feeding    LATCH Score/Interventions                      Lactation Tools Discussed/Used     Consult Status Consult Status: Complete Date: 10/22/14 Follow-up type: In-patient    Alfred LevinsGranger, Regla Fitzgibbon Ann 10/22/2014, 11:35 AM

## 2014-10-22 NOTE — Discharge Instructions (Signed)
Discharge instructions   You can wash your hair  Shower  Eat what you want  Drink what you want  See me in 6 weeks  Your ankles are going to swell more in the next 2 weeks than when pregnant  No sex for 6 weeks   Viaan Knippenberg A, MD 10/22/2014

## 2014-10-22 NOTE — Progress Notes (Signed)
Patient ID: Sarah [name], female   DOB: May 17, 1981, 34 y.o.   MRN: [id number] Postpartum day 2 Vital signs normal Fundus firm Lochia moderate Legs negative home today

## 2014-10-22 NOTE — Progress Notes (Signed)
UR chart review completed.  

## 2015-03-04 LAB — PROCEDURE REPORT - SCANNED: PAP SMEAR: ABNORMAL — AB

## 2016-02-11 ENCOUNTER — Encounter (HOSPITAL_COMMUNITY): Payer: Self-pay | Admitting: *Deleted

## 2016-02-11 ENCOUNTER — Emergency Department (HOSPITAL_COMMUNITY)
Admission: EM | Admit: 2016-02-11 | Discharge: 2016-02-12 | Disposition: A | Discharge status: Home / Self Care | Payer: Medicaid Other | Attending: Emergency Medicine | Admitting: Emergency Medicine

## 2016-02-11 DIAGNOSIS — Z791 Long term (current) use of non-steroidal anti-inflammatories (NSAID): Secondary | ICD-10-CM | POA: Insufficient documentation

## 2016-02-11 DIAGNOSIS — O26891 Other specified pregnancy related conditions, first trimester: Secondary | ICD-10-CM | POA: Insufficient documentation

## 2016-02-11 DIAGNOSIS — Z3490 Encounter for supervision of normal pregnancy, unspecified, unspecified trimester: Secondary | ICD-10-CM

## 2016-02-11 DIAGNOSIS — F329 Major depressive disorder, single episode, unspecified: Secondary | ICD-10-CM | POA: Insufficient documentation

## 2016-02-11 DIAGNOSIS — O26899 Other specified pregnancy related conditions, unspecified trimester: Secondary | ICD-10-CM

## 2016-02-11 DIAGNOSIS — Z3A01 Less than 8 weeks gestation of pregnancy: Secondary | ICD-10-CM | POA: Insufficient documentation

## 2016-02-11 DIAGNOSIS — R1030 Lower abdominal pain, unspecified: Secondary | ICD-10-CM | POA: Diagnosis present

## 2016-02-11 DIAGNOSIS — R112 Nausea with vomiting, unspecified: Secondary | ICD-10-CM | POA: Diagnosis not present

## 2016-02-11 DIAGNOSIS — R109 Unspecified abdominal pain: Secondary | ICD-10-CM

## 2016-02-11 LAB — CBC
HEMATOCRIT: 34.7 % — AB (ref 36.0–46.0)
Hemoglobin: 11.5 g/dL — ABNORMAL LOW (ref 12.0–15.0)
MCH: 27.3 pg (ref 26.0–34.0)
MCHC: 33.1 g/dL (ref 30.0–36.0)
MCV: 82.2 fL (ref 78.0–100.0)
PLATELETS: 231 10*3/uL (ref 150–400)
RBC: 4.22 MIL/uL (ref 3.87–5.11)
RDW: 15.3 % (ref 11.5–15.5)
WBC: 8.1 10*3/uL (ref 4.0–10.5)

## 2016-02-11 LAB — COMPREHENSIVE METABOLIC PANEL
ALT: 18 U/L (ref 14–54)
AST: 20 U/L (ref 15–41)
Albumin: 3.8 g/dL (ref 3.5–5.0)
Alkaline Phosphatase: 58 U/L (ref 38–126)
Anion gap: 8 (ref 5–15)
BUN: 8 mg/dL (ref 6–20)
CHLORIDE: 105 mmol/L (ref 101–111)
CO2: 23 mmol/L (ref 22–32)
CREATININE: 0.96 mg/dL (ref 0.44–1.00)
Calcium: 9.1 mg/dL (ref 8.9–10.3)
GFR calc Af Amer: >60 mL/min (ref >60)
Glucose, Bld: 113 mg/dL — ABNORMAL HIGH (ref 65–99)
Potassium: 3.4 mmol/L — ABNORMAL LOW (ref 3.5–5.1)
Sodium: 136 mmol/L (ref 135–145)
Total Bilirubin: 0.7 mg/dL (ref 0.3–1.2)
Total Protein: 7.7 g/dL (ref 6.5–8.1)

## 2016-02-11 LAB — LIPASE, BLOOD: Lipase: 31 U/L (ref 11–51)

## 2016-02-11 NOTE — ED Notes (Signed)
Pt complains of lower abdominal pain since Saturday. Pt states she initially had diarrhea but has normal bowel movements for the past day. Pt denies emesis, states she has some nausea. Pt denies dysuria, malodorous urine.

## 2016-02-12 ENCOUNTER — Emergency Department (HOSPITAL_COMMUNITY): Payer: Medicaid Other

## 2016-02-12 LAB — WET PREP, GENITAL
SPERM: NONE SEEN
Trich, Wet Prep: NONE SEEN
YEAST WET PREP: NONE SEEN

## 2016-02-12 LAB — URINALYSIS, ROUTINE W REFLEX MICROSCOPIC
BILIRUBIN URINE: NEGATIVE
GLUCOSE, UA: NEGATIVE mg/dL
HGB URINE DIPSTICK: NEGATIVE
KETONES UR: NEGATIVE mg/dL
Leukocytes, UA: NEGATIVE
Nitrite: NEGATIVE
PH: 6.5 (ref 5.0–8.0)
Protein, ur: NEGATIVE mg/dL
Specific Gravity, Urine: 1.018 (ref 1.005–1.030)

## 2016-02-12 LAB — GC/CHLAMYDIA PROBE AMP (~~LOC~~) NOT AT ARMC
Chlamydia: NEGATIVE
Neisseria Gonorrhea: NEGATIVE

## 2016-02-12 LAB — HCG, QUANTITATIVE, PREGNANCY: HCG, BETA CHAIN, QUANT, S: 85854 m[IU]/mL — AB (ref <5)

## 2016-02-12 LAB — OB RESULTS CONSOLE GC/CHLAMYDIA: GC PROBE AMP, GENITAL: NEGATIVE

## 2016-02-12 LAB — PREGNANCY, URINE: PREG TEST UR: POSITIVE — AB

## 2016-02-12 NOTE — ED Notes (Signed)
US bedside

## 2016-02-12 NOTE — Discharge Instructions (Signed)

## 2016-02-12 NOTE — ED Provider Notes (Signed)
CSN: [id number]     Arrival date & time 02/11/16  2104 History  By signing my name below, I, Bridgette Habermann, attest that this documentation has been prepared under the direction and in the presence of [employer], PA-C. Electronically Signed: Bridgette Habermann, ED Scribe. 02/12/2016. 12:14 AM.   Chief Complaint  Patient presents with  . Abdominal Pain   The history is provided by the patient. No language interpreter was used.    HPI Comments: Sarah [name] is a 35 y.o. female who presents to the Emergency Department complaining of gradually worsening, constant, non-radiating, lower abdominal pain onset 02/08/16. Pt also has some mild, intermittent nausea and urinary frequency. Pt states she initially had diarrhea but has had normal bowel movements for the past day. Per pt, she and her 60 m/o had the same viral symptoms and the patient thinks that this may be the cause. Pt took Ibuprofen with mild relief. Pt has no h/o abdominal surgeries. Pt denies vomiting, dysuria, malodorous urine, dysuria, hematuria, hematochezia, and vaginal bleeding/discharge.  Past Medical History  Diagnosis Date  . Anemia   . Depression     pp with all deliveries no meds  . Hx of chlamydia infection   . Hx of gonorrhea   . Trichomonas contact, treated   . Blood transfusion without reported diagnosis     2 units after 2011 delivery   Past Surgical History  Procedure Laterality Date  . Dilation and curettage of uterus    . Wisdom tooth extraction    . Vaginal delivery     Family History  Problem Relation Age of Onset  . Adopted: Yes  . Anesthesia problems Neg Hx   . Hypotension Neg Hx   . Malignant hyperthermia Neg Hx   . Pseudochol deficiency Neg Hx    Social History  Substance Use Topics  . Smoking status: Never Smoker   . Smokeless tobacco: Never Used  . Alcohol Use: No   OB History    Gravida Para Term Preterm AB TAB SAB Ectopic Multiple Living   Review of Systems   Constitutional: Negative for fever.  Gastrointestinal: Positive for nausea and abdominal pain. Negative for vomiting and diarrhea.  Genitourinary: Negative for dysuria, hematuria, vaginal bleeding and vaginal discharge.    Allergies  Latex and Mushroom extract complex  Home Medications   Prior to Admission medications   Medication Sig Start Date End Date Taking? Authorizing Provider  ibuprofen (ADVIL,MOTRIN) 200 MG tablet Take 400 mg by mouth every 6 (six) hours as needed for headache, mild pain or moderate pain.   Yes Historical Provider, MD   BP 115/63 mmHg  Pulse 65  Temp(Src) 98.5 F (36.9 C) (Oral)  Resp 17  Ht  (1.626 m)  Wt 124.286 kg  BMI 47.01 kg/m2  SpO2 100%  LMP 12/23/2015   Physical Exam  Constitutional: She is oriented to person, place, and time. She appears well-developed and well-nourished. No distress.  Nontoxic appearing. Patient in NAD.  HENT:  Head: Normocephalic and atraumatic.  Eyes: Conjunctivae and EOM are normal. No scleral icterus.  Neck: Normal range of motion.  Cardiovascular: Normal rate, regular rhythm and intact distal pulses.   Pulmonary/Chest: Effort normal and breath sounds normal. No respiratory distress. She has no wheezes. She has no rales.  Respirations even and unlabored  Abdominal: Soft. She exhibits no distension. There is tenderness. There is no rebound.  Mild  suprapubic tenderness. No peritoneal signs or masses.  Genitourinary: There is no rash, tenderness or lesion on the right labia. There is no rash, tenderness or lesion on the left labia. Uterus is tender. Cervix exhibits no motion tenderness and no friability. Right adnexum displays no mass, no tenderness and no fullness. Left adnexum displays no mass, no tenderness and no fullness. No bleeding in the vagina. No vaginal discharge found.  Musculoskeletal: Normal range of motion.  Neurological: She is alert and oriented to person, place, and time. She exhibits normal muscle  tone. Coordination normal.  GCS 15. Patient moving all extremities.  Skin: Skin is warm and dry. No rash noted. She is not diaphoretic. No erythema. No pallor.  Psychiatric: She has a normal mood and affect. Her behavior is normal.  Nursing note and vitals reviewed.   ED Course  Procedures  DIAGNOSTIC STUDIES: Oxygen Saturation is 100% on RA, normal by my interpretation.    COORDINATION OF CARE: 12:14 AM Discussed treatment plan with pt at bedside which includes urinalysis, lab work, and pelvic exam and pt agreed to plan.  Labs Review Labs Reviewed  WET PREP, GENITAL - Abnormal; Notable for the following:    Clue Cells Wet Prep HPF POC PRESENT (*)    WBC, Wet Prep HPF POC FEW (*)    All other components within normal limits  COMPREHENSIVE METABOLIC PANEL - Abnormal; Notable for the following:    Potassium 3.4 (*)    Glucose, Bld 113 (*)    All other components within normal limits  CBC - Abnormal; Notable for the following:    Hemoglobin 11.5 (*)    HCT 34.7 (*)    All other components within normal limits  PREGNANCY, URINE - Abnormal; Notable for the following:    Preg Test, Ur POSITIVE (*)    All other components within normal limits  HCG, QUANTITATIVE, PREGNANCY - Abnormal; Notable for the following:    hCG, Beta Chain, Quant, S [id number] (*)    All other components within normal limits  LIPASE, BLOOD  URINALYSIS, ROUTINE W REFLEX MICROSCOPIC (NOT [facility])  GC/CHLAMYDIA PROBE AMP ([place]) NOT AT [facility]    Imaging Review [place] Ob Comp Less 14 Wks  02/12/2016  CLINICAL DATA:  Lower abdominal pain since Saturday. Estimated gestational age by LMP is 7 weeks 2 days. Quantitative beta HCG is 85,854 EXAM: OBSTETRIC <14 WK US AND TRANSVAGINAL OB US TECHNIQUE: Both transabdominal and transvaginal ultrasound examinations were performed for complete evaluation of the gestation as well as the maternal uterus, adnexal regions, and pelvic cul-de-sac. Transvaginal technique was performed to  assess early pregnancy. COMPARISON:  None. FINDINGS: Intrauterine gestational sac: A single intrauterine gestational sac is identified. Yolk sac:  Yolk sac is present. Embryo:  Fetal pole is present. Cardiac Activity: Fetal cardiac activity is observed. Heart Rate: 143  bpm CRL:  11.1  mm   7 w   2 d                  US EDC: 09/28/2016 Subchorionic hemorrhage: Small subchorionic hemorrhage is demonstrated anteriorly. Maternal uterus/adnexae: Uterus is anteverted. No myometrial mass lesions. Both ovaries are identified and appear normal. No abnormal adnexal masses. Small amount of free fluid in the pelvis. IMPRESSION: Single intrauterine pregnancy. Estimated gestational age by crown-rump length is 7 weeks 2 days. Small subchorionic hemorrhage. Electronically Signed   By: Burman NievesWilliam  Stevens M.D.   On: 02/12/2016 03:42   [place] Ob Transvaginal  02/12/2016  CLINICAL  DATA:  Lower abdominal pain since Saturday. Estimated gestational age by LMP is 7 weeks 2 days. Quantitative beta HCG is 85,854 EXAM: OBSTETRIC <14 WK [place] AND TRANSVAGINAL OB US TECHNIQUE: Both transabdominal and transvaginal ultrasound examinations were performed for complete evaluation of the gestation as well as the maternal uterus, adnexal regions, and pelvic cul-de-sac. Transvaginal technique was performed to assess early pregnancy. COMPARISON:  None. FINDINGS: Intrauterine gestational sac: A single intrauterine gestational sac is identified. Yolk sac:  Yolk sac is present. Embryo:  Fetal pole is present. Cardiac Activity: Fetal cardiac activity is observed. Heart Rate: 143  bpm CRL:  11.1  mm   7 w   2 d                  [place] EDC: 09/28/2016 Subchorionic hemorrhage: Small subchorionic hemorrhage is demonstrated anteriorly. Maternal uterus/adnexae: Uterus is anteverted. No myometrial mass lesions. Both ovaries are identified and appear normal. No abnormal adnexal masses. Small amount of free fluid in the pelvis. IMPRESSION: Single intrauterine pregnancy.  Estimated gestational age by crown-rump length is 7 weeks 2 days. Small subchorionic hemorrhage. Electronically Signed   By: Burman Nieves M.D.   On: 02/12/2016 03:42      I have personally reviewed and evaluated these images and lab results as part of my medical decision-making.   EKG Interpretation None      MDM   Final diagnoses:  Abdominal pain during pregnancy  Intrauterine pregnancy    35 year old female presents to the emergency department for evaluation of 4 days of intermittent lower abdominal pain. She states that she has had urinary frequency. No dysuria or hematuria. No fevers or vomiting. Patient found to be pregnant while in the emergency department. Ultrasound obtained given complaints of lower abdominal pain. Ultrasound shows a single intrauterine pregnancy of approximately [redacted]w[redacted]d.  Abdominal pain likely secondary to stretching of the round ligament. No vaginal bleeding. Gonorrhea and chlamydia cultures sent. The suspicion for STDs at this time. Laboratory workup is reassuring. Doubt appendicitis; no leukocytosis or focal RLQ tenderness. No indication for further emergent workup. Patient discharged with referral to [facility] for prenatal care as she is not currently followed by an OB/GYN. Return precautions given at discharge. Patient agreeable to plan with no unaddressed concerns; discharged in good condition.  I personally performed the services described in this documentation, which was scribed in my presence. The recorded information has been reviewed and is accurate.    Filed Vitals:   02/11/16 2105 02/11/16 2124 02/12/16 0102 02/12/16 0408  BP: 122/74  116/68 115/63  Pulse: 84  68 65  Temp: 97.7 F (36.5 C) 99.3 F (37.4 C)  98.5 F (36.9 C)  TempSrc: Oral Oral  Oral  Resp:   19 17  Height: 5\' 4"  (1.626 m)     Weight: 124.286 kg     SpO2: 100%  98% 100%     Antony Madura, PA-C 02/12/16 0701  April Palumbo, MD 02/12/16 [phone]

## 2016-03-03 ENCOUNTER — Inpatient Hospital Stay (HOSPITAL_COMMUNITY)
Admission: AD | Admit: 2016-03-03 | Discharge: 2016-03-04 | Disposition: A | Discharge status: Home / Self Care | Payer: Medicaid Other | Source: Ambulatory Visit | Attending: Obstetrics & Gynecology | Admitting: Obstetrics & Gynecology

## 2016-03-03 ENCOUNTER — Encounter (HOSPITAL_COMMUNITY): Payer: Self-pay

## 2016-03-03 DIAGNOSIS — Z3A10 10 weeks gestation of pregnancy: Secondary | ICD-10-CM | POA: Diagnosis not present

## 2016-03-03 DIAGNOSIS — O219 Vomiting of pregnancy, unspecified: Secondary | ICD-10-CM

## 2016-03-03 DIAGNOSIS — O210 Mild hyperemesis gravidarum: Secondary | ICD-10-CM | POA: Insufficient documentation

## 2016-03-03 LAB — URINALYSIS, ROUTINE W REFLEX MICROSCOPIC
Bilirubin Urine: NEGATIVE
GLUCOSE, UA: NEGATIVE mg/dL
Hgb urine dipstick: NEGATIVE
Ketones, ur: NEGATIVE mg/dL
LEUKOCYTES UA: NEGATIVE
Nitrite: NEGATIVE
PROTEIN: NEGATIVE mg/dL
SPECIFIC GRAVITY, URINE: 1.025 (ref 1.005–1.030)
pH: 6 (ref 5.0–8.0)

## 2016-03-03 MED ORDER — FAMOTIDINE IN NACL 20-0.9 MG/50ML-% IV SOLN
20.0000 mg | Freq: Once | INTRAVENOUS | Status: AC
  Administered 2016-03-04: 20 mg via INTRAVENOUS
  Filled 2016-03-03: qty 50

## 2016-03-03 MED ORDER — METOCLOPRAMIDE HCL 5 MG/ML IJ SOLN
10.0000 mg | Freq: Once | INTRAMUSCULAR | Status: AC
  Administered 2016-03-04: 10 mg via INTRAVENOUS
  Filled 2016-03-03: qty 2

## 2016-03-03 MED ORDER — LACTATED RINGERS IV BOLUS (SEPSIS)
1000.0000 mL | Freq: Once | INTRAVENOUS | Status: AC
  Administered 2016-03-04: 1000 mL via INTRAVENOUS

## 2016-03-03 NOTE — MAU Provider Note (Signed)
History     CSN: [id number]  Arrival date and time: 03/03/16 2306   First Provider Initiated Contact with Patient 03/03/16 2340      Chief Complaint  Patient presents with  . Emesis During Pregnancy   HPI [name] is a 35 y.o. [id number] at [redacted]w[redacted]d who presents to MAU today with complaint of nausea and vomiting x 3 weeks. She states that this has become progressively worse since onset. Today she is unable to keep anything down. She has not tried any anti-emetics. She denies heartburn, diarrhea, fever, vaginal bleeding or abnormal discharge. She states bilateral lateral lower abdominal pain with vomiting only. She had confirmed SIUP on [place] at ED 2 weeks ago.   OB History    Gravida Para Term Preterm AB Living   SAB TAB Ectopic Multiple Live Births   Past Medical History:  Diagnosis Date  . Anemia   . Blood transfusion without reported diagnosis    2 units after 2011 delivery  . Depression    pp with all deliveries no meds  . Hx of chlamydia infection   . Hx of gonorrhea   . Trichomonas contact, treated     Past Surgical History:  Procedure Laterality Date  . DILATION AND CURETTAGE OF UTERUS    . VAGINAL DELIVERY    . WISDOM TOOTH EXTRACTION      Family History  Problem Relation Age of Onset  . Adopted: Yes  . Anesthesia problems Neg Hx   . Hypotension Neg Hx   . Malignant hyperthermia Neg Hx   . Pseudochol deficiency Neg Hx     Social History  Substance Use Topics  . Smoking status: Never Smoker  . Smokeless tobacco: Never Used  . Alcohol use No    Allergies:  Allergies  Allergen Reactions  . Latex Anaphylaxis and Hives  . Mushroom Extract Complex Anaphylaxis    No prescriptions prior to admission.    Review of Systems  Constitutional: Negative for fever and malaise/fatigue.  Gastrointestinal: Positive for abdominal pain, nausea and vomiting. Negative for constipation and diarrhea.  Genitourinary: Negative  for dysuria, frequency and urgency.       Neg - vaginal bleeding, abnormal discharge   Physical Exam   Blood pressure 132/70, pulse 77, temperature 98 F (36.7 C), resp. rate 18, height  (1.626 m), weight 278 lb (126.1 kg), last menstrual period 12/23/2015, SpO2 100 %, unknown if currently breastfeeding.  Physical Exam  Nursing note and vitals reviewed. Constitutional: She is oriented to person, place, and time. She appears well-developed and well-nourished. No distress.  HENT:  Head: Normocephalic and atraumatic.  Cardiovascular: Normal rate.   Respiratory: Effort normal.  GI: Soft. She exhibits no distension and no mass. There is no tenderness. There is no rebound and no guarding.  Neurological: She is alert and oriented to person, place, and time.  Skin: Skin is warm and dry. No erythema.  Psychiatric: She has a normal mood and affect.    Results for orders placed or performed during the hospital encounter of 03/03/16 (from the past 48 hour(s))  Urinalysis, Routine w reflex microscopic (not at [facility])     Status: None   Collection Time: 03/03/16 11:18 PM  Result Value Ref Range   Color, Urine YELLOW YELLOW   APPearance CLEAR CLEAR   Specific Gravity, Urine 1.025 1.005 - 1.030  pH 6.0 5.0 - 8.0   Glucose, UA NEGATIVE NEGATIVE mg/dL   Hgb urine dipstick NEGATIVE NEGATIVE   Bilirubin Urine NEGATIVE NEGATIVE   Ketones, ur NEGATIVE NEGATIVE mg/dL   Protein, ur NEGATIVE NEGATIVE mg/dL   Nitrite NEGATIVE NEGATIVE   Leukocytes, UA NEGATIVE NEGATIVE    Comment: MICROSCOPIC NOT DONE ON URINES WITH NEGATIVE PROTEIN, BLOOD, LEUKOCYTES, NITRITE, OR GLUCOSE <1000 mg/dL.  CBC with Differential/Platelet     Status: Abnormal   Collection Time: 03/04/16 12:18 AM  Result Value Ref Range   WBC 8.7 4.0 - 10.5 K/uL   RBC 4.16 3.87 - 5.11 MIL/uL   Hemoglobin 11.5 (L) 12.0 - 15.0 g/dL   HCT 56.2 (L) 13.0 - 86.5 %   MCV 82.2 78.0 - 100.0 fL   MCH 27.6 26.0 - 34.0 pg   MCHC 33.6 30.0 -  36.0 g/dL   RDW 78.4 69.6 - 29.5 %   Platelets 225 150 - 400 K/uL   Neutrophils Relative % 56 %   Neutro Abs 4.9 1.7 - 7.7 K/uL   Lymphocytes Relative 35 %   Lymphs Abs 3.0 0.7 - 4.0 K/uL   Monocytes Relative 5 %   Monocytes Absolute 0.5 0.1 - 1.0 K/uL   Eosinophils Relative 4 %   Eosinophils Absolute 0.3 0.0 - 0.7 K/uL   Basophils Relative 0 %   Basophils Absolute 0.0 0.0 - 0.1 K/uL    MAU Course  Procedures  MDM Reviewed results/notes from 7/12 visit in ED. SIUP noted on [place].  UA today without evidence of dehydration, but patient states unable to keep anything down today 1 liter IV LR bolus with 10 mg IV Reglan and 20 mg IV Pepcid given CBC, CMP today  Patient received a call from family that son was being rushed to the hospital due to a surgical complication. She states that she has to leave. Patient had received medication and some IV fluids. UA does not show evidence of significant dehydration, will discharge at this time to trial medication at home. Diet for N/V included on AVS.  Assessment and Plan  A: SIUP at [redacted]w[redacted]d Nausea and vomiting in pregnancy prior to [redacted] weeks gestation   P:  Discharge home Rx for Reglan and Pepcid given to patient  First trimester precautions discussed Diet for N/V in pregnancy included in AVS Patient advised to follow-up with [facility] as planned to start prenatal care ASAP Patient may return to MAU as needed or if her condition were to change or worsen  Marny Lowenstein, PA-C  03/04/2016, 12:55 AM

## 2016-03-03 NOTE — MAU Note (Signed)
Pt c/o nausea and vomiting that started x3 weeks. Is unable to keep anything down. Does not have any medication. C/o HA-took tylenol, but does not help. Also having some yellow mucous.

## 2016-03-04 DIAGNOSIS — O219 Vomiting of pregnancy, unspecified: Secondary | ICD-10-CM | POA: Diagnosis not present

## 2016-03-04 LAB — COMPREHENSIVE METABOLIC PANEL
ALT: 20 U/L (ref 14–54)
ANION GAP: 6 (ref 5–15)
AST: 24 U/L (ref 15–41)
Albumin: 3.7 g/dL (ref 3.5–5.0)
Alkaline Phosphatase: 60 U/L (ref 38–126)
BILIRUBIN TOTAL: 0.3 mg/dL (ref 0.3–1.2)
BUN: 14 mg/dL (ref 6–20)
CO2: 24 mmol/L (ref 22–32)
Calcium: 9.7 mg/dL (ref 8.9–10.3)
Chloride: 104 mmol/L (ref 101–111)
Creatinine, Ser: 0.99 mg/dL (ref 0.44–1.00)
GFR calc Af Amer: >60 mL/min (ref >60)
Glucose, Bld: 95 mg/dL (ref 65–99)
POTASSIUM: 4 mmol/L (ref 3.5–5.1)
Sodium: 134 mmol/L — ABNORMAL LOW (ref 135–145)
TOTAL PROTEIN: 7.4 g/dL (ref 6.5–8.1)

## 2016-03-04 LAB — CBC WITH DIFFERENTIAL/PLATELET
Basophils Absolute: 0 10*3/uL (ref 0.0–0.1)
Basophils Relative: 0 %
Eosinophils Absolute: 0.3 10*3/uL (ref 0.0–0.7)
Eosinophils Relative: 4 %
HEMATOCRIT: 34.2 % — AB (ref 36.0–46.0)
Hemoglobin: 11.5 g/dL — ABNORMAL LOW (ref 12.0–15.0)
LYMPHS ABS: 3 10*3/uL (ref 0.7–4.0)
LYMPHS PCT: 35 %
MCH: 27.6 pg (ref 26.0–34.0)
MCHC: 33.6 g/dL (ref 30.0–36.0)
MCV: 82.2 fL (ref 78.0–100.0)
MONO ABS: 0.5 10*3/uL (ref 0.1–1.0)
MONOS PCT: 5 %
NEUTROS ABS: 4.9 10*3/uL (ref 1.7–7.7)
Neutrophils Relative %: 56 %
Platelets: 225 10*3/uL (ref 150–400)
RBC: 4.16 MIL/uL (ref 3.87–5.11)
RDW: 15.1 % (ref 11.5–15.5)
WBC: 8.7 10*3/uL (ref 4.0–10.5)

## 2016-03-04 MED ORDER — METOCLOPRAMIDE HCL 10 MG PO TABS: 10.0000 mg | ORAL_TABLET | Freq: Four times a day (QID) | ORAL | 0 refills | Status: DC | PRN

## 2016-03-04 MED ORDER — FAMOTIDINE 20 MG PO TABS: 20.0000 mg | ORAL_TABLET | Freq: Two times a day (BID) | ORAL | 0 refills | Status: DC

## 2016-03-04 NOTE — Discharge Instructions (Signed)
Morning Sickness °Morning sickness is when you feel sick to your stomach (nauseous) during pregnancy. You may feel sick to your stomach and throw up (vomit). You may feel sick in the morning, but you can feel this way any time of day. Some women feel very sick to their stomach and cannot stop throwing up (hyperemesis gravidarum). °HOME CARE °· Only take medicines as told by your doctor. °· Take multivitamins as told by your doctor. Taking multivitamins before getting pregnant can stop or lessen the harshness of morning sickness. °· Eat dry toast or unsalted crackers before getting out of bed. °· Eat 5 to 6 small meals a day. °· Eat dry and bland foods like rice and baked potatoes. °· Do not drink liquids with meals. Drink between meals. °· Do not eat greasy, fatty, or spicy foods. °· Have someone cook for you if the smell of food causes you to feel sick or throw up. °· If you feel sick to your stomach after taking prenatal vitamins, take them at night or with a snack. °· Eat protein when you need a snack (nuts, yogurt, cheese). °· Eat unsweetened gelatins for dessert. °· Wear a bracelet used for sea sickness (acupressure wristband). °· Go to a doctor that puts thin needles into certain body points (acupuncture) to improve how you feel. °· Do not smoke. °· Use a humidifier to keep the air in your house free of odors. °· Get lots of fresh air. °GET HELP IF: °· You need medicine to feel better. °· You feel dizzy or lightheaded. °· You are losing weight. °GET HELP RIGHT AWAY IF:  °· You feel very sick to your stomach and cannot stop throwing up. °· You pass out (faint). °MAKE SURE YOU: °· Understand these instructions. °· Will watch your condition. °· Will get help right away if you are not doing well or get worse. °  °This information is not intended to replace advice given to you by your health care provider. Make sure you discuss any questions you have with your health care provider. °  °Document Released: 08/27/2004  Document Revised: 08/10/2014 Document Reviewed: 01/04/2013 °Elsevier Interactive Patient Education ©2016 [employer] °Eating Plan for Hyperemesis Gravidarum °Severe cases of hyperemesis gravidarum can lead to dehydration and malnutrition. The hyperemesis eating plan is one way to lessen the symptoms of nausea and vomiting. It is often used with prescribed medicines to control your symptoms.  °WHAT CAN I DO TO RELIEVE MY SYMPTOMS? °Listen to your body. Everyone is different and has different preferences. Find what works best for you. Some of the following things may help: °· Eat and drink slowly. °· Eat 5-6 small meals daily instead of 3 large meals.   °· Eat crackers before you get out of bed in the morning.   °· Starchy foods are usually well tolerated (such as cereal, toast, bread, potatoes, pasta, rice, and pretzels).   °· Ginger may help with nausea. Add ¼ tsp ground ginger to hot tea or choose ginger tea.   °· Try drinking 100% fruit juice or an electrolyte drink. °· Continue to take your prenatal vitamins as directed by your health care provider. If you are having trouble taking your prenatal vitamins, talk with your health care provider about different options. °· Include at least 1 serving of protein with your meals and snacks (such as meats or poultry, beans, nuts, eggs, or yogurt). Try eating a protein-rich snack before bed (such as cheese and crackers or a half turkey or peanut butter sandwich). °  WHAT THINGS SHOULD I AVOID TO REDUCE MY SYMPTOMS? °The following things may help reduce your symptoms: °· Avoid foods with strong smells. Try eating meals in well-ventilated areas that are free of odors. °· Avoid drinking water or other beverages with meals. Try not to drink anything less than 30 minutes before and after meals. °· Avoid drinking more than 1 cup of fluid at a time. °· Avoid fried or high-fat foods, such as butter and cream sauces. °· Avoid spicy foods. °· Avoid skipping meals the best you can.  Nausea can be more intense on an empty stomach. If you cannot tolerate food at that time, do not force it. Try sucking on ice chips or other frozen items and make up the calories later. °· Avoid lying down within 2 hours after eating. °  °This information is not intended to replace advice given to you by your health care provider. Make sure you discuss any questions you have with your health care provider. °  °Document Released: 05/17/2007 Document Revised: 07/25/2013 Document Reviewed: 05/24/2013 °Elsevier Interactive Patient Education ©2016 [employer] ° °

## 2016-04-15 ENCOUNTER — Ambulatory Visit (INDEPENDENT_AMBULATORY_CARE_PROVIDER_SITE_OTHER): Payer: Medicaid Other | Admitting: Obstetrics

## 2016-04-15 ENCOUNTER — Encounter: Payer: Self-pay | Admitting: Obstetrics

## 2016-04-15 VITALS — BP 120/79 | HR 80 | Wt 272.5 lb

## 2016-04-15 DIAGNOSIS — Z3492 Encounter for supervision of normal pregnancy, unspecified, second trimester: Secondary | ICD-10-CM

## 2016-04-15 DIAGNOSIS — O0942 Supervision of pregnancy with grand multiparity, second trimester: Secondary | ICD-10-CM

## 2016-04-15 MED ORDER — CITRANATAL HARMONY 27-1-260 MG PO CAPS: 1.0000 | ORAL_CAPSULE | Freq: Every day | ORAL | 3 refills | Status: DC

## 2016-04-15 NOTE — Progress Notes (Signed)
Subjective:    Sarah [name] is being seen today for her first obstetrical visit.  This is a planned pregnancy. She is at [redacted]w[redacted]d gestation. Her obstetrical history is significant for obesity. Relationship with FOB: spouse, living together. Patient does intend to breast feed. Pregnancy history fully reviewed.  The information documented in the HPI was reviewed and verified.  Menstrual History: OB History    Gravida Para Term Preterm AB Living   13 10 10   2 10    SAB TAB Ectopic Multiple Live Births   2     2 10        Patient's last menstrual period was 12/23/2015.    Past Medical History:  Diagnosis Date  . Anemia   . Blood transfusion without reported diagnosis    2 units after 2011 delivery  . Depression    pp with all deliveries no meds  . Hx of chlamydia infection   . Hx of gonorrhea   . Trichomonas contact, treated   . Vaginal Pap smear, abnormal    had colpo 2016    Past Surgical History:  Procedure Laterality Date  . DILATION AND CURETTAGE OF UTERUS    . VAGINAL DELIVERY    . WISDOM TOOTH EXTRACTION       (Not in a hospital admission) Allergies  Allergen Reactions  . Latex Anaphylaxis and Hives  . Mushroom Extract Complex Anaphylaxis    Social History  Substance Use Topics  . Smoking status: Never Smoker  . Smokeless tobacco: Never Used  . Alcohol use No    Family History  Problem Relation Age of Onset  . Adopted: Yes  . Anesthesia problems Neg Hx   . Hypotension Neg Hx   . Malignant hyperthermia Neg Hx   . Pseudochol deficiency Neg Hx      Review of Systems Constitutional: negative for weight loss Gastrointestinal: negative for vomiting Genitourinary:negative for genital lesions and vaginal discharge and dysuria Musculoskeletal:negative for back pain Behavioral/Psych: negative for abusive relationship, depression, illegal drug usage and tobacco use    Objective:    BP 120/79   Pulse 80   Wt 272 lb 8 oz (123.6 kg)   LMP 12/23/2015   BMI  46.77 kg/m  General Appearance:    Alert, cooperative, no distress, appears stated age  Head:    Normocephalic, without obvious abnormality, atraumatic  Eyes:    PERRL, conjunctiva/corneas clear, EOM's intact, fundi    benign, both eyes  Ears:    Normal TM's and external ear canals, both ears  Nose:   Nares normal, septum midline, mucosa normal, no drainage    or sinus tenderness  Throat:   Lips, mucosa, and tongue normal; teeth and gums normal  Neck:   Supple, symmetrical, trachea midline, no adenopathy;    thyroid:  no enlargement/tenderness/nodules; no carotid   bruit or JVD  Back:     Symmetric, no curvature, ROM normal, no CVA tenderness  Lungs:     Clear to auscultation bilaterally, respirations unlabored  Chest Wall:    No tenderness or deformity   Heart:    Regular rate and rhythm, S1 and S2 normal, no murmur, rub   or gallop  Breast Exam:    No tenderness, masses, or nipple abnormality  Abdomen:     Soft, non-tender, bowel sounds active all four quadrants,    no masses, no organomegaly  Genitalia:    Normal female without lesion, discharge or tenderness  Extremities:   Extremities normal, atraumatic, no cyanosis  or edema  Pulses:   2+ and symmetric all extremities  Skin:   Skin color, texture, turgor normal, no rashes or lesions  Lymph nodes:   Cervical, supraclavicular, and axillary nodes normal  Neurologic:   CNII-XII intact, normal strength, sensation and reflexes    throughout      Lab Review Urine pregnancy test Labs reviewed yes Radiologic studies reviewed yes Assessment:    Pregnancy at 7528w2d weeks    Plan:      Prenatal vitamins.  Counseling provided regarding continued use of seat belts, cessation of alcohol consumption, smoking or use of illicit drugs; infection precautions i.e., influenza/TDAP immunizations, toxoplasmosis,CMV, parvovirus, listeria and varicella; workplace safety, exercise during pregnancy; routine dental care, safe medications, sexual  activity, hot tubs, saunas, pools, travel, caffeine use, fish and methlymercury, potential toxins, hair treatments, varicose veins Weight gain recommendations per IOM guidelines reviewed: underweight/BMI< 18.5--> gain 28 - 40 lbs; normal weight/BMI 18.5 - 24.9--> gain 25 - 35 lbs; overweight/BMI 25 - 29.9--> gain 15 - 25 lbs; obese/BMI >30->gain  11 - 20 lbs Problem list reviewed and updated. FIRST/CF mutation testing/NIPT/QUAD SCREEN/fragile X/Ashkenazi Jewish population testing/Spinal muscular atrophy discussed: requested. Role of ultrasound in pregnancy discussed; fetal survey: requested. Amniocentesis discussed: not indicated. VBAC calculator score: VBAC consent form provided Meds ordered this encounter  Medications  . Prenat-FeFmCb-DSS-FA-DHA w/o A (CITRANATAL HARMONY) 27-1-260 MG CAPS    Sig: Take 1 capsule by mouth daily before breakfast.    Dispense:  90 capsule    Refill:  3   Orders Placed This Encounter  Procedures  . Culture, OB Urine  . HIV antibody  . Hemoglobinopathy evaluation  . Varicella zoster antibody, IgG  . VITAMIN D 25 Hydroxy (Vit-D Deficiency, Fractures)  . Prenatal Profile I    Follow up in 4 weeks. 50% of 25 min visit spent on counseling and coordination of care. Patient ID: Leone BrandShanetta Haley, female   DOB: July 09, 1981, 35 y.o.   MRN: [id number]

## 2016-04-17 LAB — AFP, QUAD SCREEN
DIA MOM VALUE: 1.09
DIA Value (EIA): 138.94 pg/mL
DSR (BY AGE) 1 IN: 367
DSR (Second Trimester) 1 IN: 4944
GESTATIONAL AGE AFP: 16.3 wk
MSAFP Mom: 1.17
MSAFP: 30.8 ng/mL
MSHCG MOM: 1.68
MSHCG: 42316 m[IU]/mL
Maternal Age At EDD: 34 YEARS
Osb Risk: 10000
T18 (By Age): 1:1430 {titer}
Test Results:: NEGATIVE
UE3 MOM: 1.38
UE3 VALUE: 1.06 ng/mL
Weight: 278 [lb_av]

## 2016-04-17 LAB — HEMOGLOBINOPATHY EVALUATION
HGB C: 0 %
HGB S: 0 %
Hemoglobin A2 Quantitation: 2.4 % (ref 0.7–3.1)
Hemoglobin F Quantitation: 0 % (ref 0.0–2.0)
Hgb A: 97.6 % (ref 94.0–98.0)

## 2016-04-17 LAB — PRENATAL PROFILE I(LABCORP)
ANTIBODY SCREEN: NEGATIVE
BASOS ABS: 0 10*3/uL (ref 0.0–0.2)
Basos: 0 %
EOS (ABSOLUTE): 0.2 10*3/uL (ref 0.0–0.4)
EOS: 3 %
HEMATOCRIT: 35.1 % (ref 34.0–46.6)
HEP B S AG: NEGATIVE
Hemoglobin: 11.7 g/dL (ref 11.1–15.9)
Immature Grans (Abs): 0 10*3/uL (ref 0.0–0.1)
Immature Granulocytes: 0 %
Lymphocytes Absolute: 2.1 10*3/uL (ref 0.7–3.1)
Lymphs: 27 %
MCH: 27.7 pg (ref 26.6–33.0)
MCHC: 33.3 g/dL (ref 31.5–35.7)
MCV: 83 fL (ref 79–97)
MONOCYTES: 8 %
Monocytes Absolute: 0.6 10*3/uL (ref 0.1–0.9)
NEUTROS ABS: 4.6 10*3/uL (ref 1.4–7.0)
Neutrophils: 62 %
PLATELETS: 230 10*3/uL (ref 150–379)
RBC: 4.23 x10E6/uL (ref 3.77–5.28)
RDW: 15.2 % (ref 12.3–15.4)
RH TYPE: POSITIVE
RPR Ser Ql: NONREACTIVE
RUBELLA: 3.1 {index} (ref >0.99)
WBC: 7.6 10*3/uL (ref 3.4–10.8)

## 2016-04-17 LAB — VITAMIN D 25 HYDROXY (VIT D DEFICIENCY, FRACTURES): VIT D 25 HYDROXY: 14 ng/mL — AB (ref 30.0–100.0)

## 2016-04-17 LAB — URINE CULTURE, OB REFLEX

## 2016-04-17 LAB — HIV ANTIBODY (ROUTINE TESTING W REFLEX): HIV SCREEN 4TH GENERATION: NONREACTIVE

## 2016-04-17 LAB — CULTURE, OB URINE

## 2016-04-17 LAB — VARICELLA ZOSTER ANTIBODY, IGG: Varicella zoster IgG: <135 index — ABNORMAL LOW (ref >165)

## 2016-04-18 LAB — NUSWAB VG+, CANDIDA 6SP
CANDIDA ALBICANS, NAA: NEGATIVE
CANDIDA LUSITANIAE, NAA: NEGATIVE
CHLAMYDIA TRACHOMATIS, NAA: NEGATIVE
Candida glabrata, NAA: NEGATIVE
Candida krusei, NAA: NEGATIVE
Candida parapsilosis, NAA: NEGATIVE
Candida tropicalis, NAA: NEGATIVE
NEISSERIA GONORRHOEAE, NAA: NEGATIVE
TRICH VAG BY NAA: NEGATIVE

## 2016-04-19 LAB — PAP IG AND HPV HIGH-RISK
HPV, high-risk: NEGATIVE
PAP SMEAR COMMENT: 0

## 2016-04-28 ENCOUNTER — Encounter: Payer: Self-pay | Admitting: *Deleted

## 2016-05-12 NOTE — Addendum Note (Signed)
Addended by: Pennie BanterSMITH, MARNI W on: 05/12/2016 04:15 PM   Modules accepted: Orders

## 2016-05-13 ENCOUNTER — Encounter (HOSPITAL_COMMUNITY): Payer: Self-pay

## 2016-05-13 ENCOUNTER — Ambulatory Visit (HOSPITAL_COMMUNITY)
Admission: RE | Admit: 2016-05-13 | Discharge: 2016-05-13 | Disposition: A | Discharge status: Home / Self Care | Payer: Medicaid Other | Source: Ambulatory Visit | Attending: Obstetrics | Admitting: Obstetrics

## 2016-05-13 DIAGNOSIS — Z3A20 20 weeks gestation of pregnancy: Secondary | ICD-10-CM | POA: Diagnosis not present

## 2016-05-13 DIAGNOSIS — O99212 Obesity complicating pregnancy, second trimester: Secondary | ICD-10-CM | POA: Insufficient documentation

## 2016-05-13 DIAGNOSIS — O0942 Supervision of pregnancy with grand multiparity, second trimester: Secondary | ICD-10-CM | POA: Diagnosis not present

## 2016-05-13 DIAGNOSIS — Z363 Encounter for antenatal screening for malformations: Secondary | ICD-10-CM | POA: Diagnosis not present

## 2016-05-13 DIAGNOSIS — Z3492 Encounter for supervision of normal pregnancy, unspecified, second trimester: Secondary | ICD-10-CM

## 2016-05-14 ENCOUNTER — Encounter: Payer: Self-pay | Admitting: Obstetrics

## 2016-05-14 ENCOUNTER — Other Ambulatory Visit: Payer: Self-pay

## 2016-05-15 ENCOUNTER — Ambulatory Visit (INDEPENDENT_AMBULATORY_CARE_PROVIDER_SITE_OTHER): Payer: Medicaid Other | Admitting: Obstetrics

## 2016-05-15 ENCOUNTER — Encounter: Payer: Self-pay | Admitting: Obstetrics

## 2016-05-15 VITALS — BP 127/80 | HR 79 | Temp 98.5°F | Wt 275.9 lb

## 2016-05-15 DIAGNOSIS — Z3492 Encounter for supervision of normal pregnancy, unspecified, second trimester: Secondary | ICD-10-CM

## 2016-06-12 ENCOUNTER — Ambulatory Visit (INDEPENDENT_AMBULATORY_CARE_PROVIDER_SITE_OTHER): Payer: Medicaid Other | Admitting: Obstetrics

## 2016-06-12 ENCOUNTER — Encounter: Payer: Self-pay | Admitting: Obstetrics

## 2016-06-12 VITALS — BP 128/83 | HR 78 | Temp 98.8°F | Wt 281.8 lb

## 2016-06-12 DIAGNOSIS — Z3490 Encounter for supervision of normal pregnancy, unspecified, unspecified trimester: Secondary | ICD-10-CM

## 2016-06-12 DIAGNOSIS — Z3492 Encounter for supervision of normal pregnancy, unspecified, second trimester: Secondary | ICD-10-CM

## 2016-06-12 NOTE — Progress Notes (Signed)
Patient c/o stomach pain x 1 week

## 2016-06-12 NOTE — Progress Notes (Signed)
Subjective:    Sarah [name] is a 35 y.o. female being seen today for her obstetrical visit. She is at 4785w4d gestation. Patient reports: cramping . Fetal movement: normal.  Problem List Items Addressed This Visit    None    Visit Diagnoses    Encounter for supervision of low-risk pregnancy, antepartum    -  Primary     Patient Active Problem List   Diagnosis Date Noted  . NVD (normal vaginal delivery) 10/21/2014  . Normal pregnancy 10/20/2014  . Decreased fetal movement   . [redacted] weeks gestation of pregnancy   . [redacted] weeks gestation of pregnancy   . Encounter for fetal anatomic survey   . Problem with fetal growth not found   . IUGR (intrauterine growth restriction) affecting care of mother    Objective:    BP 128/83   Pulse 78   Temp 98.8 F (37.1 C)   Wt 281 lb 12.8 oz (127.8 kg)   LMP 12/23/2015   BMI 48.37 kg/m  FHT: 150 BPM  Uterine Size: size equals dates     Assessment:    Pregnancy @ 8985w4d    Plan:    OBGCT: ordered for next visit. Signs and symptoms of preterm labor: discussed.  Labs, problem list reviewed and updated 2 hr GTT planned Follow up in 4 weeks.

## 2016-06-23 ENCOUNTER — Encounter: Payer: Self-pay | Admitting: *Deleted

## 2016-07-10 ENCOUNTER — Other Ambulatory Visit: Payer: Medicaid Other

## 2016-07-10 ENCOUNTER — Ambulatory Visit (INDEPENDENT_AMBULATORY_CARE_PROVIDER_SITE_OTHER): Payer: Medicaid Other | Admitting: Obstetrics

## 2016-07-10 ENCOUNTER — Encounter: Payer: Self-pay | Admitting: Obstetrics

## 2016-07-10 VITALS — BP 134/76 | HR 75 | Temp 98.6°F | Wt 282.3 lb

## 2016-07-10 DIAGNOSIS — Z3490 Encounter for supervision of normal pregnancy, unspecified, unspecified trimester: Secondary | ICD-10-CM

## 2016-07-10 DIAGNOSIS — Z3493 Encounter for supervision of normal pregnancy, unspecified, third trimester: Secondary | ICD-10-CM

## 2016-07-10 NOTE — Progress Notes (Signed)
Subjective:    Sarah [name] is a 35 y.o. female being seen today for her obstetrical visit. She is at 2717w4d gestation. Patient reports no complaints. Fetal movement: normal.  Problem List Items Addressed This Visit    None    Visit Diagnoses    Encounter for supervision of low-risk pregnancy, antepartum    -  Primary   Relevant Orders   Glucose Tolerance, 2 Hours w/1 Hour   CBC   HIV antibody (with reflex)   RPR     Patient Active Problem List   Diagnosis Date Noted  . NVD (normal vaginal delivery) 10/21/2014  . Normal pregnancy 10/20/2014  . Decreased fetal movement   . [redacted] weeks gestation of pregnancy   . [redacted] weeks gestation of pregnancy   . Encounter for fetal anatomic survey   . Problem with fetal growth not found   . IUGR (intrauterine growth restriction) affecting care of mother    Objective:    BP 134/76   Pulse 75   Temp 98.6 F (37 C)   Wt 282 lb 4.8 oz (128.1 kg)   LMP 12/23/2015   BMI 48.46 kg/m  FHT:  150 BPM  Uterine Size: size equals dates  Presentation: unsure     Assessment:    Pregnancy @ 5617w4d weeks   Plan:     labs reviewed, problem list updated Consent signed. GBS sent TDAP offered  Rhogam given for RH negative Pediatrician: discussed. Infant feeding: plans to breastfeed. Maternity leave: discussed. Cigarette smoking: never smoked. Orders Placed This Encounter  Procedures  . Glucose Tolerance, 2 Hours w/1 Hour  . CBC  . HIV antibody (with reflex)  . RPR   No orders of the defined types were placed in this encounter.  Follow up in 2 Weeks.

## 2016-07-11 LAB — GLUCOSE TOLERANCE, 2 HOURS W/ 1HR
GLUCOSE, 1 HOUR: 112 mg/dL (ref 65–179)
GLUCOSE, 2 HOUR: 92 mg/dL (ref 65–152)
Glucose, Fasting: 74 mg/dL (ref 65–91)

## 2016-07-11 LAB — CBC
HEMATOCRIT: 35.1 % (ref 34.0–46.6)
HEMOGLOBIN: 12 g/dL (ref 11.1–15.9)
MCH: 29.2 pg (ref 26.6–33.0)
MCHC: 34.2 g/dL (ref 31.5–35.7)
MCV: 85 fL (ref 79–97)
Platelets: 197 10*3/uL (ref 150–379)
RBC: 4.11 x10E6/uL (ref 3.77–5.28)
RDW: 15.6 % — AB (ref 12.3–15.4)
WBC: 8 10*3/uL (ref 3.4–10.8)

## 2016-07-11 LAB — HIV ANTIBODY (ROUTINE TESTING W REFLEX): HIV SCREEN 4TH GENERATION: NONREACTIVE

## 2016-07-11 LAB — RPR: RPR: NONREACTIVE

## 2016-07-28 ENCOUNTER — Ambulatory Visit (INDEPENDENT_AMBULATORY_CARE_PROVIDER_SITE_OTHER): Payer: Medicaid Other | Admitting: Obstetrics

## 2016-07-28 VITALS — BP 134/83 | HR 86 | Temp 98.6°F | Wt 282.6 lb

## 2016-07-28 DIAGNOSIS — Z3490 Encounter for supervision of normal pregnancy, unspecified, unspecified trimester: Secondary | ICD-10-CM

## 2016-07-28 DIAGNOSIS — O3663X0 Maternal care for excessive fetal growth, third trimester, not applicable or unspecified: Secondary | ICD-10-CM

## 2016-07-28 DIAGNOSIS — Z3493 Encounter for supervision of normal pregnancy, unspecified, third trimester: Secondary | ICD-10-CM

## 2016-07-28 NOTE — Progress Notes (Signed)
Subjective:    Sarah [name] is a 35 y.o. female being seen today for her obstetrical visit. She is at 4064w1d gestation. Patient reports no complaints. Fetal movement: normal.  Problem List Items Addressed This Visit    Normal pregnancy    Other Visit Diagnoses    Encounter for supervision of low-risk pregnancy, antepartum    -  Primary   Excessive fetal growth affecting management of pregnancy in third trimester, single or unspecified fetus       Relevant Orders   US OB Follow Up     Patient Active Problem List   Diagnosis Date Noted  . NVD (normal vaginal delivery) 10/21/2014  . Normal pregnancy 10/20/2014  . Decreased fetal movement   . [redacted] weeks gestation of pregnancy   . [redacted] weeks gestation of pregnancy   . Encounter for fetal anatomic survey   . Problem with fetal growth not found   . IUGR (intrauterine growth restriction) affecting care of mother    Objective:    BP 134/83   Pulse 86   Temp 98.6 F (37 C)   Wt 282 lb 9.6 oz (128.2 kg)   LMP 12/23/2015   BMI 48.51 kg/m  FHT:  150 BPM  Uterine Size: size greater than dates  Presentation: unsure     Assessment:    Pregnancy @ 5364w1d weeks    Size > Dates  Plan:    Ultrasound ordered for growth   labs reviewed, problem list updated Consent signed. GBS sent TDAP offered  Rhogam given for RH negative Pediatrician: discussed. Infant feeding: plans to breastfeed. Maternity leave: discussed. Cigarette smoking: never smoked. Orders Placed This Encounter  Procedures  . US OB Follow Up    Standing Status:   Future    Standing Expiration Date:   09/28/2017    Order Specific Question:   Reason for Exam (SYMPTOM  OR DIAGNOSIS REQUIRED)    Answer:   size > dates    Order Specific Question:   Preferred imaging location?    Answer:   [facility]   No orders of the defined types were placed in this encounter.  Follow up in 2 Weeks.

## 2016-08-03 NOTE — L&D Delivery Note (Addendum)
Vaginal Delivery Note  36 y.o. [id number] at 5179w1d delivered a viable female infant at 2303 in cephalic, ROA position. No nuchal cord. Left anterior shoulder delivered with ease. Sixty sec delayed cord clamping. Cord clamped x2 and cut. Placenta delivered by Dr. Macon LargeAnyanwu via manual extraction with retained tissue requiring intrauterine removal by lap, with 3VC. Fundus firm on exam with massage and pitocin.  Mother: Anesthesia: Epidural Laceration: None Suture repair: N/A EBL: 1200 mL, 0.2 Methergine x1 given followed by 1000 mcg rectal cytotec.   Baby: Apgars: 8, 9 Weight: Pending Cord pH: Not sent  Good hemostasis noted. Mom to postpartum.  Baby to Couplet care / Skin to Skin.  Wendee Beaversavid J McMullen, DO, PGY-1 10/06/2016, 11:37 PM  I attest that I was present at this delivery and that I agree with the above documentation.   Luna KitchensKathryn Demarrion Meiklejohn CNM

## 2016-08-11 ENCOUNTER — Ambulatory Visit (INDEPENDENT_AMBULATORY_CARE_PROVIDER_SITE_OTHER): Payer: Medicaid Other

## 2016-08-11 ENCOUNTER — Encounter: Payer: Self-pay | Admitting: Obstetrics

## 2016-08-11 ENCOUNTER — Ambulatory Visit (INDEPENDENT_AMBULATORY_CARE_PROVIDER_SITE_OTHER): Payer: Medicaid Other | Admitting: Obstetrics

## 2016-08-11 VITALS — BP 109/72 | HR 88 | Wt 279.0 lb

## 2016-08-11 DIAGNOSIS — O3663X0 Maternal care for excessive fetal growth, third trimester, not applicable or unspecified: Secondary | ICD-10-CM | POA: Diagnosis not present

## 2016-08-11 DIAGNOSIS — Z3480 Encounter for supervision of other normal pregnancy, unspecified trimester: Secondary | ICD-10-CM

## 2016-08-11 DIAGNOSIS — Z3483 Encounter for supervision of other normal pregnancy, third trimester: Secondary | ICD-10-CM

## 2016-08-11 DIAGNOSIS — Z3490 Encounter for supervision of normal pregnancy, unspecified, unspecified trimester: Secondary | ICD-10-CM

## 2016-08-11 NOTE — Progress Notes (Signed)
No complaints today per pt.  

## 2016-08-11 NOTE — Progress Notes (Signed)
Subjective:    Sarah [name] is a 36 y.o. female being seen today for her obstetrical visit. She is at 6064w1d gestation. Patient reports no complaints. Fetal movement: normal.  Problem List Items Addressed This Visit    Normal pregnancy     Patient Active Problem List   Diagnosis Date Noted  . NVD (normal vaginal delivery) 10/21/2014  . Normal pregnancy 10/20/2014  . Decreased fetal movement   . [redacted] weeks gestation of pregnancy   . [redacted] weeks gestation of pregnancy   . Encounter for fetal anatomic survey   . Problem with fetal growth not found   . IUGR (intrauterine growth restriction) affecting care of mother    Objective:    BP 109/72   Pulse 88   Wt 279 lb (126.6 kg)   LMP 12/23/2015   BMI 47.89 kg/m  FHT:  150 BPM  Uterine Size: size equals dates  Presentation: unsure     Assessment:    Pregnancy @ 6264w1d weeks   Plan:     labs reviewed, problem list updated Consent signed. GBS sent TDAP offered  Rhogam given for RH negative Pediatrician: discussed. Infant feeding: plans to breastfeed. Maternity leave: discussed. Cigarette smoking: never smoked. No orders of the defined types were placed in this encounter.  No orders of the defined types were placed in this encounter.  Follow up in 2 Weeks.   Patient ID: Sarah [name], female   DOB: 1981/03/20, 36 y.o.   MRN: [id number]

## 2016-08-25 ENCOUNTER — Ambulatory Visit (INDEPENDENT_AMBULATORY_CARE_PROVIDER_SITE_OTHER): Payer: Medicaid Other | Admitting: Obstetrics

## 2016-08-25 ENCOUNTER — Encounter: Payer: Self-pay | Admitting: Obstetrics

## 2016-08-25 VITALS — BP 117/72 | HR 82 | Wt 278.0 lb

## 2016-08-25 DIAGNOSIS — Z3490 Encounter for supervision of normal pregnancy, unspecified, unspecified trimester: Secondary | ICD-10-CM

## 2016-08-25 DIAGNOSIS — Z3483 Encounter for supervision of other normal pregnancy, third trimester: Secondary | ICD-10-CM

## 2016-08-25 DIAGNOSIS — Z3480 Encounter for supervision of other normal pregnancy, unspecified trimester: Secondary | ICD-10-CM

## 2016-08-25 NOTE — Progress Notes (Signed)
Subjective:    Sarah BrandShanetta Dysert is a 36 y.o. female being seen today for her obstetrical visit. She is at 133w1d gestation. Patient reports no complaints. Fetal movement: normal.  Problem List Items Addressed This Visit    Normal pregnancy     Patient Active Problem List   Diagnosis Date Noted  . NVD (normal vaginal delivery) 10/21/2014  . Normal pregnancy 10/20/2014  . Decreased fetal movement   . [redacted] weeks gestation of pregnancy   . [redacted] weeks gestation of pregnancy   . Encounter for fetal anatomic survey   . Problem with fetal growth not found   . IUGR (intrauterine growth restriction) affecting care of mother    Objective:    BP 117/72   Pulse 82   Wt 278 lb (126.1 kg)   LMP 12/23/2015   BMI 47.72 kg/m  FHT:  150 BPM  Uterine Size: size equals dates  Presentation: unsure     Assessment:    Pregnancy @ 5733w1d weeks   Plan:     labs reviewed, problem list updated Consent signed. GBS sent TDAP offered  Rhogam given for RH negative Pediatrician: discussed. Infant feeding: plans to breastfeed. Maternity leave: discussed. Cigarette smoking: never smoked. No orders of the defined types were placed in this encounter.  No orders of the defined types were placed in this encounter.  Follow up in 1 Week.   Patient ID: Sarah [name], female   DOB: [birth year], 36 y.o.   MRN: [id number]

## 2016-08-25 NOTE — Addendum Note (Signed)
Addended by: Elby BeckPAUL, JANE F on: 08/25/2016 04:15 PM   Modules accepted: Orders

## 2016-08-26 LAB — OB RESULTS CONSOLE GBS: GBS: NEGATIVE

## 2016-08-27 LAB — STREP GP B NAA: STREP GROUP B AG: NEGATIVE

## 2016-09-03 ENCOUNTER — Encounter: Payer: Self-pay | Admitting: Obstetrics

## 2016-09-03 ENCOUNTER — Ambulatory Visit (INDEPENDENT_AMBULATORY_CARE_PROVIDER_SITE_OTHER): Payer: Medicaid Other | Admitting: Obstetrics

## 2016-09-03 VITALS — BP 120/79 | HR 84 | Wt 278.0 lb

## 2016-09-03 DIAGNOSIS — Z3483 Encounter for supervision of other normal pregnancy, third trimester: Secondary | ICD-10-CM

## 2016-09-03 DIAGNOSIS — Z3480 Encounter for supervision of other normal pregnancy, unspecified trimester: Secondary | ICD-10-CM

## 2016-09-03 NOTE — Progress Notes (Signed)
Subjective:  Sarah [name] Blatter is a 36 y.o. [id number] at 6434w3d being seen today for ongoing prenatal care.  She is currently monitored for the following issues for this low-risk pregnancy and has [redacted] weeks gestation of pregnancy; Encounter for fetal anatomic survey; Problem with fetal growth not found; IUGR (intrauterine growth restriction) affecting care of mother; Decreased fetal movement; [redacted] weeks gestation of pregnancy; Normal pregnancy; and NVD (normal vaginal delivery) on her problem list.  Patient reports no complaints.  Contractions: Irregular. Vag. Bleeding: None.  Movement: Absent. Denies leaking of fluid.   The following portions of the patient's history were reviewed and updated as appropriate: allergies, current medications, past family history, past medical history, past social history, past surgical history and problem list. Problem list updated.  Objective:   Vitals:   09/03/16 1525  BP: 120/79  Pulse: 84  Weight: 278 lb (126.1 kg)    Fetal Status: Fetal Heart Rate (bpm): 150   Movement: Absent     General:  Alert, oriented and cooperative. Patient is in no acute distress.  Skin: Skin is warm and dry. No rash noted.   Cardiovascular: Normal heart rate noted  Respiratory: Normal respiratory effort, no problems with respiration noted  Abdomen: Soft, gravid, appropriate for gestational age. Pain/Pressure: Present     Pelvic:  Cervical exam deferred        Extremities: Normal range of motion.  Edema: None  Mental Status: Normal mood and affect. Normal behavior. Normal judgment and thought content.   Urinalysis:      Assessment and Plan:  Pregnancy: [id number] at 4934w3d  There are no diagnoses linked to this encounter. Preterm labor symptoms and general obstetric precautions including but not limited to vaginal bleeding, contractions, leaking of fluid and fetal movement were reviewed in detail with the patient. Please refer to After Visit Summary for other counseling  recommendations.  No Follow-up on file.   Brock Badharles A Harper, MDPatient ID: Sarah [name] Louison, female   DOB: 1980-12-29, 36 y.o.   MRN: [id number]

## 2016-09-10 ENCOUNTER — Encounter: Payer: Medicaid Other | Admitting: Obstetrics

## 2016-09-15 ENCOUNTER — Ambulatory Visit (INDEPENDENT_AMBULATORY_CARE_PROVIDER_SITE_OTHER): Payer: Medicaid Other | Admitting: Obstetrics

## 2016-09-15 VITALS — BP 132/84 | HR 82 | Wt 284.4 lb

## 2016-09-15 DIAGNOSIS — Z3483 Encounter for supervision of other normal pregnancy, third trimester: Secondary | ICD-10-CM

## 2016-09-15 DIAGNOSIS — Z3480 Encounter for supervision of other normal pregnancy, unspecified trimester: Secondary | ICD-10-CM

## 2016-09-15 NOTE — Progress Notes (Signed)
Patient is having some loose stolls.

## 2016-09-16 ENCOUNTER — Encounter: Payer: Self-pay | Admitting: Obstetrics

## 2016-09-16 NOTE — Progress Notes (Signed)
Subjective:  Sarah [name] is a 36 y.o. [id number] at 8355w2d being seen today for ongoing prenatal care.  She is currently monitored for the following issues for this low-risk pregnancy and has [redacted] weeks gestation of pregnancy; Encounter for fetal anatomic survey; Problem with fetal growth not found; IUGR (intrauterine growth restriction) affecting care of mother; Decreased fetal movement; [redacted] weeks gestation of pregnancy; Normal pregnancy; and NVD (normal vaginal delivery) on her problem list.  Patient reports no complaints.  Contractions: Irregular. Vag. Bleeding: None.  Movement: Present. Denies leaking of fluid.   The following portions of the patient's history were reviewed and updated as appropriate: allergies, current medications, past family history, past medical history, past social history, past surgical history and problem list. Problem list updated.  Objective:   Vitals:   09/15/16 1335  BP: 132/84  Pulse: 82  Weight: 284 lb 6.4 oz (129 kg)    Fetal Status: Fetal Heart Rate (bpm): 150   Movement: Present     General:  Alert, oriented and cooperative. Patient is in no acute distress.  Skin: Skin is warm and dry. No rash noted.   Cardiovascular: Normal heart rate noted  Respiratory: Normal respiratory effort, no problems with respiration noted  Abdomen: Soft, gravid, appropriate for gestational age. Pain/Pressure: Present     Pelvic:  Cervical exam deferred        Extremities: Normal range of motion.  Edema: None  Mental Status: Normal mood and affect. Normal behavior. Normal judgment and thought content.   Urinalysis:      Assessment and Plan:  Pregnancy: [id number] at 255w2d  There are no diagnoses linked to this encounter. Term labor symptoms and general obstetric precautions including but not limited to vaginal bleeding, contractions, leaking of fluid and fetal movement were reviewed in detail with the patient. Please refer to After Visit Summary for other counseling  recommendations.  No Follow-up on file.   Brock Badharles A Melecio Cueto, MDPatient ID: Sarah [name], female   DOB: 1981/01/23, 36 y.o.   MRN: [id number]

## 2016-09-23 ENCOUNTER — Ambulatory Visit (INDEPENDENT_AMBULATORY_CARE_PROVIDER_SITE_OTHER): Payer: Medicaid Other | Admitting: Certified Nurse Midwife

## 2016-09-23 VITALS — BP 120/71 | HR 81 | Wt 283.0 lb

## 2016-09-23 DIAGNOSIS — O365930 Maternal care for other known or suspected poor fetal growth, third trimester, not applicable or unspecified: Secondary | ICD-10-CM

## 2016-09-23 DIAGNOSIS — Z3A39 39 weeks gestation of pregnancy: Secondary | ICD-10-CM

## 2016-09-23 DIAGNOSIS — Z3480 Encounter for supervision of other normal pregnancy, unspecified trimester: Secondary | ICD-10-CM

## 2016-09-23 NOTE — Progress Notes (Signed)
   PRENATAL VISIT NOTE  Subjective:  Sarah [name] is a 36 y.o. [id number] at 7154w2d being seen today for ongoing prenatal care.  She is currently monitored for the following issues for this low-risk pregnancy and has [redacted] weeks gestation of pregnancy and Supervision of other normal pregnancy, antepartum on her problem list.  Patient reports backache, no bleeding, no cramping, no leaking and occasional contractions.  Contractions: Irregular. Vag. Bleeding: None.  Movement: Present. Denies leaking of fluid.   The following portions of the patient's history were reviewed and updated as appropriate: allergies, current medications, past family history, past medical history, past social history, past surgical history and problem list. Problem list updated.  Objective:   Vitals:   09/23/16 1401  BP: 120/71  Pulse: 81  Weight: 283 lb (128.4 kg)    Fetal Status: Fetal Heart Rate (bpm): 136 Fundal Height: 42 cm Movement: Present  Presentation: Vertex  General:  Alert, oriented and cooperative. Patient is in no acute distress.  Skin: Skin is warm and dry. No rash noted.   Cardiovascular: Normal heart rate noted  Respiratory: Normal respiratory effort, no problems with respiration noted  Abdomen: Soft, gravid, appropriate for gestational age. Pain/Pressure: Present     Pelvic:  Cervical exam performed Dilation: 2 Effacement (%): 60 Station: -3  Extremities: Normal range of motion.  Edema: None  Mental Status: Normal mood and affect. Normal behavior. Normal judgment and thought content.   Assessment and Plan:  Pregnancy: [id number] at 3554w2d  1. Supervision of other normal pregnancy, antepartum     Doing well.  Grand multigravida.   2. [redacted] weeks gestation of pregnancy    Term labor symptoms and general obstetric precautions including but not limited to vaginal bleeding, contractions, leaking of fluid and fetal movement were reviewed in detail with the patient. Please refer to After Visit  Summary for other counseling recommendations.  No Follow-up on file.   Roe Coombsachelle A Lula Michaux, CNM

## 2016-10-01 ENCOUNTER — Ambulatory Visit (INDEPENDENT_AMBULATORY_CARE_PROVIDER_SITE_OTHER): Payer: Medicaid Other | Admitting: Certified Nurse Midwife

## 2016-10-01 ENCOUNTER — Encounter: Payer: Self-pay | Admitting: Certified Nurse Midwife

## 2016-10-01 VITALS — BP 133/85 | HR 89 | Wt 282.6 lb

## 2016-10-01 DIAGNOSIS — Z3483 Encounter for supervision of other normal pregnancy, third trimester: Secondary | ICD-10-CM | POA: Diagnosis not present

## 2016-10-01 DIAGNOSIS — Z8759 Personal history of other complications of pregnancy, childbirth and the puerperium: Secondary | ICD-10-CM

## 2016-10-01 NOTE — Progress Notes (Signed)
   PRENATAL VISIT NOTE  Subjective:  Sarah [name] is a 36 y.o. [id number] at [redacted]w[redacted]d being seen today for ongoing prenatal care.  She is currently monitored for the following issues for this low-risk pregnancy and has [redacted] weeks gestation of pregnancy and Supervision of other normal pregnancy, antepartum on her problem list.  Patient reports no bleeding, no cramping, no leaking and occasional contractions.  Contractions: Irregular. Vag. Bleeding: None.  Movement: Present. Denies leaking of fluid.   The following portions of the patient's history were reviewed and updated as appropriate: allergies, current medications, past family history, past medical history, past social history, past surgical history and problem list. Problem list updated.  Objective:   Vitals:   10/01/16 1504  BP: 133/85  Pulse: 89  Weight: 282 lb 9.6 oz (128.2 kg)    Fetal Status: Fetal Heart Rate (bpm): NST;reactive Fundal Height: 43 cm Movement: Present  Presentation: Vertex  General:  Alert, oriented and cooperative. Patient is in no acute distress.  Skin: Skin is warm and dry. No rash noted.   Cardiovascular: Normal heart rate noted  Respiratory: Normal respiratory effort, no problems with respiration noted  Abdomen: Soft, gravid, appropriate for gestational age. Pain/Pressure: Present     Pelvic:  Cervical exam performed Dilation: 2.5 Effacement (%): 50 Station: -3  Extremities: Normal range of motion.  Edema: None  Mental Status: Normal mood and affect. Normal behavior. Normal judgment and thought content.   Assessment and Plan:  Pregnancy: [id number] at [redacted]w[redacted]d  1. Personal history of previous postdates pregnancy    Reactive NST.   NST: + accels, no decels, moderate variability, Cat. 1 tracing. No contractions on toco.   IOL scheduled  - Fetal nonstress test; Future  Term labor symptoms and general obstetric precautions including but not limited to vaginal bleeding, contractions, leaking of fluid and  fetal movement were reviewed in detail with the patient. Please refer to After Visit Summary for other counseling recommendations.  Return in about 4 weeks (around 10/29/2016) for Postpartum.   Roe Coombsachelle A Devine Dant, CNM

## 2016-10-02 ENCOUNTER — Telehealth (HOSPITAL_COMMUNITY): Payer: Self-pay | Admitting: *Deleted

## 2016-10-02 ENCOUNTER — Other Ambulatory Visit: Payer: Self-pay | Admitting: Advanced Practice Midwife

## 2016-10-02 NOTE — Telephone Encounter (Signed)
Preadmission screen  

## 2016-10-05 ENCOUNTER — Other Ambulatory Visit: Payer: Self-pay | Admitting: Obstetrics and Gynecology

## 2016-10-06 ENCOUNTER — Inpatient Hospital Stay (HOSPITAL_COMMUNITY)
Admission: RE | Admit: 2016-10-06 | Discharge: 2016-10-08 | DRG: 767 | Disposition: A | Discharge status: Home / Self Care | Payer: Medicaid Other | Source: Ambulatory Visit | Attending: Obstetrics & Gynecology | Admitting: Obstetrics & Gynecology

## 2016-10-06 ENCOUNTER — Inpatient Hospital Stay (HOSPITAL_COMMUNITY): Payer: Medicaid Other | Admitting: Anesthesiology

## 2016-10-06 ENCOUNTER — Encounter (HOSPITAL_COMMUNITY): Payer: Self-pay

## 2016-10-06 DIAGNOSIS — O480 Post-term pregnancy: Secondary | ICD-10-CM | POA: Diagnosis present

## 2016-10-06 DIAGNOSIS — Z3A41 41 weeks gestation of pregnancy: Secondary | ICD-10-CM

## 2016-10-06 DIAGNOSIS — Z3480 Encounter for supervision of other normal pregnancy, unspecified trimester: Secondary | ICD-10-CM

## 2016-10-06 LAB — CBC
HEMATOCRIT: 33.3 % — AB (ref 36.0–46.0)
HEMOGLOBIN: 11.4 g/dL — AB (ref 12.0–15.0)
MCH: 29.8 pg (ref 26.0–34.0)
MCHC: 34.2 g/dL (ref 30.0–36.0)
MCV: 87.2 fL (ref 78.0–100.0)
Platelets: 169 10*3/uL (ref 150–400)
RBC: 3.82 MIL/uL — ABNORMAL LOW (ref 3.87–5.11)
RDW: 13.4 % (ref 11.5–15.5)
WBC: 7.5 10*3/uL (ref 4.0–10.5)

## 2016-10-06 LAB — PREPARE RBC (CROSSMATCH)

## 2016-10-06 LAB — POSTPARTUM HEMORRHAGE PROTOCOL (BB NOTIFICATION)

## 2016-10-06 MED ORDER — LIDOCAINE HCL (PF) 1 % IJ SOLN
INTRAMUSCULAR | Status: DC | PRN
  Administered 2016-10-06: 4 mL via EPIDURAL

## 2016-10-06 MED ORDER — FENTANYL CITRATE (PF) 100 MCG/2ML IJ SOLN: 100.0000 ug | INTRAMUSCULAR | Status: DC | PRN

## 2016-10-06 MED ORDER — SOD CITRATE-CITRIC ACID 500-334 MG/5ML PO SOLN: 30.0000 mL | ORAL | Status: DC | PRN

## 2016-10-06 MED ORDER — ONDANSETRON HCL 4 MG/2ML IJ SOLN: 4.0000 mg | Freq: Four times a day (QID) | INTRAMUSCULAR | Status: DC | PRN

## 2016-10-06 MED ORDER — MISOPROSTOL 200 MCG PO TABS
1000.0000 ug | ORAL_TABLET | Freq: Once | ORAL | Status: AC
  Administered 2016-10-06: 1000 ug via VAGINAL
  Filled 2016-10-06: qty 5

## 2016-10-06 MED ORDER — ACETAMINOPHEN 325 MG PO TABS
650.0000 mg | ORAL_TABLET | ORAL | Status: DC | PRN
  Administered 2016-10-06: 650 mg via ORAL
  Filled 2016-10-06: qty 2

## 2016-10-06 MED ORDER — LIDOCAINE HCL (PF) 1 % IJ SOLN: 30.0000 mL | INTRAMUSCULAR | Status: DC | PRN

## 2016-10-06 MED ORDER — OXYTOCIN BOLUS FROM INFUSION
500.0000 mL | Freq: Once | INTRAVENOUS | Status: AC
  Administered 2016-10-06: 500 mL via INTRAVENOUS

## 2016-10-06 MED ORDER — LACTATED RINGERS IV SOLN: 500.0000 mL | Freq: Once | INTRAVENOUS | Status: DC

## 2016-10-06 MED ORDER — TERBUTALINE SULFATE 1 MG/ML IJ SOLN: 0.2500 mg | Freq: Once | INTRAMUSCULAR | Status: DC | PRN

## 2016-10-06 MED ORDER — LACTATED RINGERS IV SOLN
500.0000 mL | INTRAVENOUS | Status: DC | PRN
Start: 2016-10-06 — End: 2016-10-07

## 2016-10-06 MED ORDER — OXYCODONE-ACETAMINOPHEN 5-325 MG PO TABS: 2.0000 | ORAL_TABLET | ORAL | Status: DC | PRN

## 2016-10-06 MED ORDER — EPHEDRINE 5 MG/ML INJ: 10.0000 mg | INTRAVENOUS | Status: DC | PRN

## 2016-10-06 MED ORDER — LACTATED RINGERS IV SOLN
500.0000 mL | Freq: Once | INTRAVENOUS | Status: AC
  Administered 2016-10-06: 500 mL via INTRAVENOUS

## 2016-10-06 MED ORDER — OXYCODONE-ACETAMINOPHEN 5-325 MG PO TABS: 1.0000 | ORAL_TABLET | ORAL | Status: DC | PRN

## 2016-10-06 MED ORDER — FENTANYL 2.5 MCG/ML BUPIVACAINE 1/10 % EPIDURAL INFUSION (WH - ANES)
14.0000 mL/h | INTRAMUSCULAR | Status: DC | PRN
  Administered 2016-10-06 (×2): 14 mL/h via EPIDURAL
  Filled 2016-10-06: qty 100

## 2016-10-06 MED ORDER — OXYTOCIN 40 UNITS IN LACTATED RINGERS INFUSION - SIMPLE MED: 2.5000 [IU]/h | INTRAVENOUS | Status: DC

## 2016-10-06 MED ORDER — OXYTOCIN 40 UNITS IN LACTATED RINGERS INFUSION - SIMPLE MED
1.0000 m[IU]/min | INTRAVENOUS | Status: DC
  Administered 2016-10-06: 2 m[IU]/min via INTRAVENOUS
  Filled 2016-10-06: qty 1000

## 2016-10-06 MED ORDER — METHYLERGONOVINE MALEATE 0.2 MG/ML IJ SOLN
0.2000 mg | Freq: Once | INTRAMUSCULAR | Status: AC
  Administered 2016-10-06: 0.2 mg via INTRAMUSCULAR
  Filled 2016-10-06: qty 1

## 2016-10-06 MED ORDER — PHENYLEPHRINE 40 MCG/ML (10ML) SYRINGE FOR IV PUSH (FOR BLOOD PRESSURE SUPPORT)
80.0000 ug | PREFILLED_SYRINGE | INTRAVENOUS | Status: DC | PRN
  Filled 2016-10-06: qty 10

## 2016-10-06 MED ORDER — PHENYLEPHRINE 40 MCG/ML (10ML) SYRINGE FOR IV PUSH (FOR BLOOD PRESSURE SUPPORT): 80.0000 ug | PREFILLED_SYRINGE | INTRAVENOUS | Status: DC | PRN

## 2016-10-06 MED ORDER — LACTATED RINGERS IV SOLN
INTRAVENOUS | Status: DC
  Administered 2016-10-06 (×3): via INTRAVENOUS

## 2016-10-06 MED ORDER — ZOLPIDEM TARTRATE 5 MG PO TABS
5.0000 mg | ORAL_TABLET | Freq: Every evening | ORAL | Status: DC | PRN
Start: 2016-10-06 — End: 2016-10-07

## 2016-10-06 MED ORDER — DIPHENHYDRAMINE HCL 50 MG/ML IJ SOLN
12.5000 mg | INTRAMUSCULAR | Status: DC | PRN
  Administered 2016-10-06: 12.5 mg via INTRAVENOUS
  Filled 2016-10-06: qty 1

## 2016-10-06 NOTE — Progress Notes (Signed)
Sarah [name] is a 36 y.o. [id number] at 6822w1d  Subjective: Patient doing well. Pain controlled with epidural.  Objective: BP 133/68   Pulse 67   Temp 97.4 F (36.3 C) (Oral)   Resp 18   Ht 5\' 4"  (1.626 m)   Wt 278 lb (126.1 kg)   LMP 12/23/2015   SpO2 99%   BMI 47.72 kg/m  No intake/output data recorded. No intake/output data recorded.  FHT:  FHR: 115 bpm, variability: minimal ,  accelerations:  Present,  decelerations:  Absent UC:   regular, every 3 minutes SVE:   Dilation: 7 Effacement (%): 70 Station: 0 Exam by:: a. harper, rnc-ob  Labs: Lab Results  Component Value Date   WBC 7.5 10/06/2016   HGB 11.4 (L) 10/06/2016   HCT 33.3 (L) 10/06/2016   MCV 87.2 10/06/2016   PLT 169 10/06/2016    Assessment / Plan: Induction of labor due to postterm,  progressing well on pitocin, AROM @1845 .  Labor: Progressing normally Preeclampsia:  None Fetal Wellbeing:  Category I Pain Control:  Epidural I/D:  GBS negative Anticipated MOD:  NSVD  Wendee Beaversavid J Janus Vlcek, DO, PGY-1 10/06/2016, 8:47 PM

## 2016-10-06 NOTE — Anesthesia Pain Management Evaluation Note (Deleted)
  CRNA Pain Management Visit Note  Patient: Sarah [name], 36 y.o., female  "Hello I am a member of the anesthesia team at [facility]. We have an anesthesia team available at all times to provide care throughout the hospital, including epidural management and anesthesia for C-section. I don't know your plan for the delivery whether it a natural birth, water birth, IV sedation, nitrous supplementation, doula or epidural, but we want to meet your pain goals."   1.Was your pain managed to your expectations on prior hospitalizations?   Yes   2.What is your expectation for pain management during this hospitalization?     Epidural and IV pain meds  3.How can we help you reach that goal? epidural  Record the patient's initial score and the patient's pain goal.   Pain: 0  Pain Goal: 4 [facility] wants you to be able to say your pain was always managed very well.  [name] 10/06/2016

## 2016-10-06 NOTE — Progress Notes (Signed)
S Confortable w/o epidural. Feeling contractions. Doesn't want epidural for now.   O BP 124/74   Pulse 76   Temp 97.6 F (36.4 C) (Oral)   Resp 18   Ht 5\' 4"  (1.626 m)   Wt 278 lb (126.1 kg)   LMP 12/23/2015   BMI 47.72 kg/m  FHR: Baseline 130, mod variability, + acels, no decels  P Continue induction   Adair LaundryAna Carvalho do Amaral MD PGY1

## 2016-10-06 NOTE — Progress Notes (Signed)
Provider notified of IV filtration--orders to restart pitocin at 

## 2016-10-06 NOTE — Progress Notes (Addendum)
Ms Sarah [name] is a 36yo [id number] undergoing IOL for postdates.  S: Comfortable with epidural, no complaints.   O: BP 139/74   Pulse 75   Temp 97.6 F (36.4 C) (Oral)   Resp 18   Ht 5\' 4"  (1.626 m)   Wt 278 lb (126.1 kg)   LMP 12/23/2015   BMI 47.72 kg/m   FHR: basline 120/ mod variability, + accels, no decels Contractions: q2-63min, SVE: 5/70/-1  AROM for small amount of clear fluid at 1842  A&P: Continue with induction of labor, continue Pitocin. Category 1 FHR tracing.  GBS negative. Anticipate SVD.  Tereso NewcomerUgonna A Lamoyne Palencia, MD

## 2016-10-06 NOTE — Anesthesia Pain Management Evaluation Note (Signed)
  CRNA Pain Management Visit Note  Patient: Sarah [name], 36 y.o., female  "Hello I am a member of the anesthesia team at [facility]. We have an anesthesia team available at all times to provide care throughout the hospital, including epidural management and anesthesia for C-section. I don't know your plan for the delivery whether it a natural birth, water birth, IV sedation, nitrous supplementation, doula or epidural, but we want to meet your pain goals."   1.Was your pain managed to your expectations on prior hospitalizations?   No   2.What is your expectation for pain management during this hospitalization?     Epidural  3.How can we help you reach that goal? epidural  Record the patient's initial score and the patient's pain goal.   Pain: 4  Pain Goal: 7 [facility] wants you to be able to say your pain was always managed very well.  Syla Devoss 10/06/2016

## 2016-10-06 NOTE — Anesthesia Procedure Notes (Signed)
Epidural Patient location during procedure: OB Start time: 10/06/2016 7:22 PM End time: 10/06/2016 7:34 PM  Staffing Anesthesiologist: Shona SimpsonHOLLIS, Shardae Kleinman D Performed: anesthesiologist   Preanesthetic Checklist Completed: patient identified, site marked, surgical consent, pre-op evaluation, timeout performed, IV checked, risks and benefits discussed and monitors and equipment checked  Epidural Patient position: sitting Prep: ChloraPrep Patient monitoring: heart rate, continuous pulse ox and blood pressure Approach: midline Location: L3-L4 Injection technique: LOR saline  Needle:  Needle type: Tuohy  Needle gauge: 17 G Needle length: 9 cm Catheter type: closed end flexible Catheter size: 20 Guage Test dose: negative and 1.5% lidocaine  Assessment Events: blood not aspirated, injection not painful, no injection resistance and no paresthesia  Additional Notes LOR @ 10  Patient identified. Risks/Benefits/Options discussed with patient including but not limited to bleeding, infection, nerve damage, paralysis, failed block, incomplete pain control, headache, blood pressure changes, nausea, vomiting, reactions to medications, itching and postpartum back pain. Confirmed with bedside nurse the patient's most recent platelet count. Confirmed with patient that they are not currently taking any anticoagulation, have any bleeding history or any family history of bleeding disorders. Patient expressed understanding and wished to proceed. All questions were answered. Sterile technique was used throughout the entire procedure. Please see nursing notes for vital signs. Test dose was given through epidural catheter and negative prior to continuing to dose epidural or start infusion. Warning signs of high block given to the patient including shortness of breath, tingling/numbness in hands, complete motor block, or any concerning symptoms with instructions to call for help. Patient was given instructions on fall  risk and not to get out of bed. All questions and concerns addressed with instructions to call with any issues or inadequate analgesia.    Reason for block:procedure for pain

## 2016-10-06 NOTE — Anesthesia Preprocedure Evaluation (Addendum)
Anesthesia Evaluation  Patient identified by MRN, date of birth, ID band Patient awake    Airway Mallampati: III       Dental  (+) Teeth Intact   Pulmonary neg pulmonary ROS,    breath sounds clear to auscultation       Cardiovascular negative cardio ROS   Rhythm:Regular Rate:Normal     Neuro/Psych PSYCHIATRIC DISORDERS Depression negative neurological ROS     GI/Hepatic negative GI ROS, Neg liver ROS,   Endo/Other  negative endocrine ROS  Renal/GU negative Renal ROS  negative genitourinary   Musculoskeletal negative musculoskeletal ROS (+)   Abdominal   Peds negative pediatric ROS (+)  Hematology negative hematology ROS (+)   Anesthesia Other Findings   Reproductive/Obstetrics (+) Pregnancy                            Lab Results  Component Value Date   WBC 7.5 10/06/2016   HGB 11.4 (L) 10/06/2016   HCT 33.3 (L) 10/06/2016   MCV 87.2 10/06/2016   PLT 169 10/06/2016     Anesthesia Physical Anesthesia Plan  ASA: III  Anesthesia Plan: Epidural   Post-op Pain Management:    Induction:   Airway Management Planned:   Additional Equipment:   Intra-op Plan:   Post-operative Plan:   Informed Consent: I have reviewed the patients History and Physical, chart, labs and discussed the procedure including the risks, benefits and alternatives for the proposed anesthesia with the patient or authorized representative who has indicated his/her understanding and acceptance.     Plan Discussed with:   Anesthesia Plan Comments:         Anesthesia Quick Evaluation

## 2016-10-06 NOTE — Progress Notes (Signed)
Ms Sarah [name] is a 36yo [id number] being IOL for postdates w/ Pitocin.  S: Feeling well, hopeful to deliver today. Starting to feel some pressure. Reports her last child (2yo) was VAVD, no complications on other labors/deliveries. Last labor lasted for about 12h.  O: BP 125/79   Pulse 76   Temp 97.4 F (36.3 C) (Oral)   Resp 18   Ht 5\' 4"  (1.626 m)   Wt 278 lb (126.1 kg)   LMP 12/23/2015   BMI 47.72 kg/m   SVE: 2-3/70/-2 Vertex presentation confirmed by US FHR: basline 120/ mod variability, + acels, no decels Contractions: regular q404min, 120MVU  A&P: Proceede with induction of labor, continue Pitocin  Adair LaundryAna Carvalho do Amaral MD PGY1

## 2016-10-06 NOTE — Progress Notes (Signed)
   Sarah [name] is a 36 y.o. [id number] at 1934w1d  admitted for induction of labor due to Post dates. Due date 09/28/2016.  Subjective:  Patient resting comfortably in bed with epidural.  Objective: Vitals:   10/06/16 2020 10/06/16 2025 10/06/16 2030 10/06/16 2032  BP:    133/68  Pulse: 69 77 77 67  Resp:   18   Temp:      TempSrc:      SpO2: 96% (!) 86% 99%   Weight:      Height:       No intake/output data recorded.  FHT:  FHR: 120 bpm, variability: moderate,  accelerations:  Present,  decelerations:  Absent UC:   irregular, every 2-4 minutes SVE:   Dilation: 7 Effacement (%): 70 Station: 0 Exam by:: a. harper, rnc-ob Pitocin @ 20 mu/min  Labs: Lab Results  Component Value Date   WBC 7.5 10/06/2016   HGB 11.4 (L) 10/06/2016   HCT 33.3 (L) 10/06/2016   MCV 87.2 10/06/2016   PLT 169 10/06/2016    Assessment / Plan: Induction of labor due to postterm,  progressing well on pitocin  Labor: Progressing normally Fetal Wellbeing:  Category I Pain Control:  Epidural Anticipated MOD:  NSVD  Charlesetta GaribaldiKathryn Lorraine Kooistra CNM 10/06/2016, 9:12 PM

## 2016-10-06 NOTE — H&P (Signed)
Sahana Boyland is a 36 y.o. female presenting for Induction of labor for postdates. Followed in [place] office .Marland Kitchen OB History    Gravida Para Term Preterm AB Living   13 10 10   2 10    SAB TAB Ectopic Multiple Live Births   2     2 10      Past Medical History:  Diagnosis Date  . Anemia   . Blood transfusion without reported diagnosis    2 units after 2011 delivery  . Depression    pp with all deliveries no meds  . Hx of chlamydia infection   . Hx of gonorrhea   . Trichomonas contact, treated   . Vaginal Pap smear, abnormal    had colpo 2016   Past Surgical History:  Procedure Laterality Date  . DILATION AND CURETTAGE OF UTERUS    . VAGINAL DELIVERY    . WISDOM TOOTH EXTRACTION     Family History: family history is not on file. She was adopted. Social History:  reports that she has never smoked. She has never used smokeless tobacco. She reports that she does not drink alcohol or use drugs.     Maternal Diabetes: No Genetic Screening: Normal Maternal Ultrasounds/Referrals: Normal Fetal Ultrasounds or other Referrals:  None Maternal Substance Abuse:  No Significant Maternal Medications:  None Significant Maternal Lab Results:  Lab values include: Group B Strep negative Other Comments:  None  Review of Systems  Constitutional: Negative for chills and fever.  Eyes: Negative for blurred vision.  Respiratory: Negative for shortness of breath.   Gastrointestinal: Negative for abdominal pain, constipation, diarrhea, nausea and vomiting.  Genitourinary: Negative for dysuria.  Musculoskeletal: Negative for back pain.  Neurological: Positive for headaches (none currently). Negative for dizziness and focal weakness.   Maternal Medical History:  Reason for admission: Nausea. Induction of labor for postdates   Contractions: Frequency: irregular.   Perceived severity is mild.    Fetal activity: Perceived fetal activity is normal.   Last perceived fetal movement was within  the past hour.    Prenatal complications: No bleeding, HIV, PIH, placental abnormality, pre-eclampsia, preterm labor or substance abuse.   Prenatal Complications - Diabetes: none.      Blood pressure 136/75, pulse 78, temperature 98.6 F (37 C), temperature source Axillary, resp. rate 18, last menstrual period 12/23/2015, unknown if currently breastfeeding. Maternal Exam:  Uterine Assessment: Contraction strength is mild.  Contraction frequency is irregular.   Abdomen: Patient reports no abdominal tenderness. Fundal height is 40.   Estimated fetal weight is 8.   Fetal presentation: vertex  Introitus: Normal vulva. Normal vagina.  Ferning test: not done.  Nitrazine test: not done.  Pelvis: adequate for delivery.   Cervix: Cervix evaluated by digital exam.     Fetal Exam Fetal Monitor Review: Mode: ultrasound.   Baseline rate: 120.  Variability: moderate (6-25 bpm).   Pattern: accelerations present and no decelerations.    Fetal State Assessment: Category II - tracings are indeterminate. Small accels noted    Physical Exam  Constitutional: She is oriented to person, place, and time. She appears well-developed and well-nourished. No distress.  HENT:  Head: Normocephalic.  Neck: Normal range of motion.  Cardiovascular: Normal rate, regular rhythm and normal heart sounds.  Exam reveals no gallop and no friction rub.   No murmur heard. Respiratory: Effort normal and breath sounds normal. No respiratory distress. She has no wheezes. She has no rales. She exhibits no tenderness.  GI: Soft. She exhibits  no distension. There is no tenderness. There is no rebound and no guarding.  Musculoskeletal: Normal range of motion.  Neurological: She is alert and oriented to person, place, and time.  Skin: Skin is warm and dry.  Psychiatric: She has a normal mood and affect.    Last cervix exam 2-3/50/-3/vertex  Prenatal labs: ABO, Rh: B/Positive/-- (09/13 1338) Antibody: Negative  (09/13 1338) Rubella: 3.10 (09/13 1338) RPR: Non Reactive (12/08 1015)  HBsAg: Negative (09/13 1338)  HIV: Non Reactive (12/08 1015)  GBS: Negative (01/23 1616)   Assessment/Plan: SIUP at 2771w1d Induction of labor for postdates  [facility] Routine orders Plan Pitocin induction   Wynelle BourgeoisMarie Ysabella Babiarz 10/06/2016, 1:02 AM

## 2016-10-06 NOTE — Progress Notes (Signed)
Ms Sarah [name] is a 36yo [id number] being IOL for postdates w/ Pitocin.  S: Feeling contractions  O: BP 124/74   Pulse 76   Temp 97.6 F (36.4 C) (Oral)   Resp 18   Ht 5\' 4"  (1.626 m)   Wt 278 lb (126.1 kg)   LMP 12/23/2015   BMI 47.72 kg/m   SVE:5/70-80/-2 Vertex FHR: baseline 120/ mod variability, + acels, no decels Contractions: regular q303min, 200MVU  A&P: Progressing. Proceede with induction of labor, continue Pitocin  Adair LaundryAna Carvalho do Amaral MD PGY1

## 2016-10-07 ENCOUNTER — Encounter (HOSPITAL_COMMUNITY): Payer: Self-pay

## 2016-10-07 LAB — CBC
HEMATOCRIT: 30.7 % — AB (ref 36.0–46.0)
Hemoglobin: 10.6 g/dL — ABNORMAL LOW (ref 12.0–15.0)
MCH: 30.2 pg (ref 26.0–34.0)
MCHC: 34.5 g/dL (ref 30.0–36.0)
MCV: 87.5 fL (ref 78.0–100.0)
PLATELETS: 179 10*3/uL (ref 150–400)
RBC: 3.51 MIL/uL — AB (ref 3.87–5.11)
RDW: 13.3 % (ref 11.5–15.5)
WBC: 10.9 10*3/uL — ABNORMAL HIGH (ref 4.0–10.5)

## 2016-10-07 LAB — RPR: RPR: NONREACTIVE

## 2016-10-07 MED ORDER — SIMETHICONE 80 MG PO CHEW: 80.0000 mg | CHEWABLE_TABLET | ORAL | Status: DC | PRN

## 2016-10-07 MED ORDER — PRENATAL MULTIVITAMIN CH
1.0000 | ORAL_TABLET | Freq: Every day | ORAL | Status: DC
  Administered 2016-10-07: 1 via ORAL
  Filled 2016-10-07: qty 1

## 2016-10-07 MED ORDER — TETANUS-DIPHTH-ACELL PERTUSSIS 5-2.5-18.5 LF-MCG/0.5 IM SUSP
0.5000 mL | Freq: Once | INTRAMUSCULAR | Status: DC
  Filled 2016-10-07: qty 0.5

## 2016-10-07 MED ORDER — ONDANSETRON HCL 4 MG/2ML IJ SOLN: 4.0000 mg | INTRAMUSCULAR | Status: DC | PRN

## 2016-10-07 MED ORDER — PIPERACILLIN-TAZOBACTAM 3.375 G IVPB
3.3750 g | Freq: Three times a day (TID) | INTRAVENOUS | Status: AC
  Administered 2016-10-07 (×3): 3.375 g via INTRAVENOUS
  Filled 2016-10-07 (×4): qty 50

## 2016-10-07 MED ORDER — ZOLPIDEM TARTRATE 5 MG PO TABS: 5.0000 mg | ORAL_TABLET | Freq: Every evening | ORAL | Status: DC | PRN

## 2016-10-07 MED ORDER — DIPHENHYDRAMINE HCL 25 MG PO CAPS: 25.0000 mg | ORAL_CAPSULE | Freq: Four times a day (QID) | ORAL | Status: DC | PRN

## 2016-10-07 MED ORDER — IBUPROFEN 600 MG PO TABS
600.0000 mg | ORAL_TABLET | Freq: Four times a day (QID) | ORAL | Status: DC
  Administered 2016-10-07 – 2016-10-08 (×5): 600 mg via ORAL
  Filled 2016-10-07 (×5): qty 1

## 2016-10-07 MED ORDER — ONDANSETRON HCL 4 MG PO TABS: 4.0000 mg | ORAL_TABLET | ORAL | Status: DC | PRN

## 2016-10-07 MED ORDER — DIBUCAINE 1 % RE OINT: 1.0000 "application " | TOPICAL_OINTMENT | RECTAL | Status: DC | PRN

## 2016-10-07 MED ORDER — SENNOSIDES-DOCUSATE SODIUM 8.6-50 MG PO TABS
2.0000 | ORAL_TABLET | ORAL | Status: DC
  Administered 2016-10-07: 2 via ORAL
  Filled 2016-10-07: qty 2

## 2016-10-07 MED ORDER — BENZOCAINE-MENTHOL 20-0.5 % EX AERO: 1.0000 "application " | INHALATION_SPRAY | CUTANEOUS | Status: DC | PRN

## 2016-10-07 MED ORDER — POLYSACCHARIDE IRON COMPLEX 150 MG PO CAPS
150.0000 mg | ORAL_CAPSULE | Freq: Two times a day (BID) | ORAL | Status: DC
  Administered 2016-10-07 – 2016-10-08 (×3): 150 mg via ORAL
  Filled 2016-10-07 (×3): qty 1

## 2016-10-07 MED ORDER — WITCH HAZEL-GLYCERIN EX PADS: 1.0000 "application " | MEDICATED_PAD | CUTANEOUS | Status: DC | PRN

## 2016-10-07 MED ORDER — COCONUT OIL OIL: 1.0000 "application " | TOPICAL_OIL | Status: DC | PRN

## 2016-10-07 MED ORDER — ACETAMINOPHEN 325 MG PO TABS
650.0000 mg | ORAL_TABLET | ORAL | Status: DC | PRN
  Administered 2016-10-07 – 2016-10-08 (×2): 650 mg via ORAL
  Filled 2016-10-07 (×2): qty 2

## 2016-10-07 NOTE — Progress Notes (Signed)
Pt requested information on Advance Directives. I provided the information and let her know of our availability if she wishes to fill one out while she is here in the hospital.  Kahi MohalaChaplain Katy Shavonne Ambroise, Bcc Pager, [phone] 4:49 PM    10/07/16 1600  Clinical Encounter Type  Visited With Patient  Visit Type Initial

## 2016-10-07 NOTE — Progress Notes (Signed)
MOB was referred for history of depression/anxiety. * Referral screened out by Clinical Social Worker because none of the following criteria appear to apply: ~ History of anxiety/depression during this pregnancy, or of post-partum depression. ~ Diagnosis of anxiety and/or depression within last 3 years OR * MOB's symptoms currently being treated with medication and/or therapy.  CSW completed chart review and MOB was dx with depression over 3 years ago and is an established patient with [facility].   Please contact the Clinical Social Worker if needs arise, or if MOB requests.  Blaine HamperAngel Boyd-Gilyard, MSW, LCSW Clinical Social Work [phone]

## 2016-10-07 NOTE — Lactation Note (Signed)
This note was copied from a baby's chart. Lactation Consultation Note  Patient Name: Sarah [name] ZOXWR'UToday's Date: 10/07/2016 Reason for consult: Initial assessment Visited with Mom, baby 4413 hrs old.  Mom has breastfed 10 other children, for about 2 yrs.  Baby has had 2 formula feedings by bottle, and 2 breast feedings with latch scores of 8 and 9.  Mom states she is feeling some discomfort when baby latches, and encouraged her to call for next feeding so her RN or LC can assess.  Baby sleeping and swaddled in her lap.  Talked about benefits of exclusive breastfeeding, and encouraged her to keep baby STS, feeding him often on cue.   Brochure given to Mom.  Information on IP and OP lactation services shared.   Lactation to follow up prn and daily.   Judee ClaraSmith, Roshawnda Pecora E 10/07/2016, 12:10 PM

## 2016-10-07 NOTE — Progress Notes (Signed)
PPD Day 1 SVD  S:  Reports feeling well             Tolerating po/ No nausea or vomiting             Bleeding is light             Pain controlled withibuprofen (OTC)             Up ad lib / ambulatory / voiding QS  Newborn bottle feeding  / Circumcision yes  Successfully breastfeeding? no  Anesthesia in labor epidural  O:               VS: BP 139/71 (BP Location: Right Arm)   Pulse 95   Temp 98 F (36.7 C)   Resp 18   Ht 5\' 4"  (1.626 m)   Wt 278 lb (126.1 kg)   LMP 12/23/2015   SpO2 100%   Breastfeeding? Unknown   BMI 47.72 kg/m    LABS:               Recent Labs  10/06/16 0148 10/07/16 0526  WBC 7.5 10.9*  HGB 11.4* 10.6*  PLT 169 179               Blood type: --/--/B POS (03/06 0148)  Rubella: 3.10 (09/13 1338)                     EBL: 1200   I&O: Intake/Output      03/06 0701 - 03/07 0700 03/07 0701 - 03/08 0700   Urine (mL/kg/hr) 500 (0.2)    Blood 1424 (0.5)    Total Output 1924     Net -1924          Urine Occurrence 1 x                  Physical Exam:             Alert and oriented X3  Lungs: Clear and unlabored  Heart: regular rate and rhythm / no mumurs  Abdomen: soft, non-tender, non-distended              Fundus: firm, non-tender, U-1  Perineum: intact  Hemorroids:  no  Lochia: mild to moderate  Extremities: edema, no calf pain or tenderness    Induction/Augmentation? Pitocin for induction  Instrumental delivery? no   A: PPD # 1  Doing well - stable status  P: Routine post partum orders  PP OV in 4 weeks with [place]  Anticipate discharge on 10-08-2016  Luna KitchensKathryn Kooistra CNM 10/07/2016, 7:04 AM

## 2016-10-07 NOTE — Anesthesia Postprocedure Evaluation (Addendum)
Anesthesia Post Note  Patient: Sarah [name]  Procedure(s) Performed: * No procedures listed *  Patient location during evaluation: Mother Baby Anesthesia Type: Epidural Level of consciousness: awake and alert and oriented Pain management: pain level controlled Vital Signs Assessment: post-procedure vital signs reviewed and stable Respiratory status: spontaneous breathing and nonlabored ventilation Cardiovascular status: stable Postop Assessment: no headache, no backache, epidural receding, patient able to bend at knees, no signs of nausea or vomiting and adequate PO intake Anesthetic complications: no        Last Vitals:  Vitals:   10/07/16 0228 10/07/16 0555  BP: 140/73 139/71  Pulse: 73 95  Resp:  18  Temp: 37.6 C 36.7 C    Last Pain:  Vitals:   10/07/16 0653  TempSrc:   PainSc: 0-No pain   Pain Goal: Patients Stated Pain Goal: 5 (10/07/16 0023)               Laban EmperorMalinova,Nataliya Hristova

## 2016-10-08 MED ORDER — IBUPROFEN 600 MG PO TABS: 600.0000 mg | ORAL_TABLET | Freq: Four times a day (QID) | ORAL | 2 refills | Status: DC

## 2016-10-08 MED ORDER — SENNOSIDES-DOCUSATE SODIUM 8.6-50 MG PO TABS: 2.0000 | ORAL_TABLET | Freq: Every day | ORAL | 2 refills | Status: DC

## 2016-10-08 MED ORDER — CITRANATAL BLOOM 90-1 MG PO TABS: 1.0000 | ORAL_TABLET | Freq: Every day | ORAL | 6 refills | Status: DC

## 2016-10-08 NOTE — Lactation Note (Signed)
This note was copied from a baby's chart. Lactation Consultation Note  P11.  Baby 35 hours old. Discussed bf positions and provided mother with manual pump. Mom encouraged to feed baby 8-12 times/24 hours and with feeding cues. Encouraged her to call if she needs further assistance.  Patient Name: [name] ZOXWR'UToday's Date: 10/08/2016 Reason for consult: Follow-up assessment   Maternal Data    Feeding Feeding Type: Formula  LATCH Score/Interventions                      Lactation Tools Discussed/Used     Consult Status Consult Status: Complete    Hardie PulleyBerkelhammer, Ruth Boschen 10/08/2016, 10:52 AM

## 2016-10-08 NOTE — Discharge Summary (Signed)
OB Discharge Summary     Patient Name: Sarah BrandShanetta Bisceglia DOB: [birth year] MRN: [id number]  Date of admission: 10/06/2016 Delivering MD: Wendee BeaversMCMULLEN, DAVID J   Date of discharge: 10/08/2016  Admitting diagnosis: INDUCTION Intrauterine pregnancy: 5656w1d     Secondary diagnosis:  Active Problems:   Supervision of other normal pregnancy, antepartum   Post term pregnancy at [redacted] weeks gestation  Additional problems: Manual extraction of placenta.      Discharge diagnosis: Term Pregnancy Delivered                                                                                                Post partum procedures:none  Augmentation: AROM and Pitocin  Complications: Hemorrhage>107100mL, manual extraction of placenta  Hospital course:  Induction of Labor With Vaginal Delivery   36 y.o. yo [id number] at 7056w1d was admitted to the hospital 10/06/2016 for induction of labor.  Indication for induction: Postdates.  Patient had an uncomplicated labor course as follows: Membrane Rupture Time/Date: 6:42 PM ,10/06/2016   Intrapartum Procedures: Episiotomy: None [1]                                         Lacerations:  None [1]  Patient had delivery of a Viable infant.  Information for the patient's newborn:  Paulene FloorDrone, Boy Tiffannie [[id number]][[id number]]  Delivery Method: Vaginal, Spontaneous Delivery (Filed from Delivery Summary)   10/06/2016  Details of delivery can be found in separate delivery note.  Patient had a routine postpartum course. Patient is discharged home 10/08/16.  Physical exam  Vitals:   10/07/16 0228 10/07/16 0555 10/07/16 1813 10/08/16 0646  BP: 140/73 139/71 128/73 (!) 120/53  Pulse: 73 95 75 67  Resp:  18 18 18   Temp: 99.7 F (37.6 C) 98 F (36.7 C) 97.5 F (36.4 C) 98.2 F (36.8 C)  TempSrc: Oral  Oral Oral  SpO2: 100%     Weight:      Height:       General: alert, cooperative and no distress Lochia: appropriate Uterine Fundus: firm Incision: N/A DVT Evaluation: No evidence of DVT seen  on physical exam. No cords or calf tenderness. No significant calf/ankle edema. Labs: Lab Results  Component Value Date   WBC 10.9 (H) 10/07/2016   HGB 10.6 (L) 10/07/2016   HCT 30.7 (L) 10/07/2016   MCV 87.5 10/07/2016   PLT 179 10/07/2016   CMP Latest Ref Rng & Units 03/04/2016  Glucose 65 - 99 mg/dL 95  BUN 6 - 20 mg/dL 14  Creatinine 0.630.44 - 0.161.00 mg/dL 0.100.99  Sodium 932135 - 355145 mmol/L 134(L)  Potassium 3.5 - 5.1 mmol/L 4.0  Chloride 101 - 111 mmol/L 104  CO2 22 - 32 mmol/L 24  Calcium 8.9 - 10.3 mg/dL 9.7  Total Protein 6.5 - 8.1 g/dL 7.4  Total Bilirubin 0.3 - 1.2 mg/dL 0.3  Alkaline Phos 38 - 126 U/L 60  AST 15 - 41 U/L 24  ALT 14 - 54 U/L 20    Discharge  instruction: per After Visit Summary and "Baby and Me Booklet".  After visit meds:  Allergies as of 10/08/2016      Reactions   Latex Anaphylaxis, Hives   Mushroom Extract Complex Anaphylaxis      Medication List    TAKE these medications   CITRANATAL BLOOM 90-1 MG Tabs Take 1 tablet by mouth at bedtime.   CITRANATAL HARMONY 27-1-260 MG Caps Take 1 capsule by mouth daily before breakfast.   ibuprofen 600 MG tablet Commonly known as:  ADVIL,MOTRIN Take 1 tablet (600 mg total) by mouth every 6 (six) hours.   senna-docusate 8.6-50 MG tablet Commonly known as:  Senokot-S Take 2 tablets by mouth at bedtime.       Diet: routine diet  Activity: Advance as tolerated. Pelvic rest for 6 weeks.   Outpatient follow up:4 weeks Follow up Appt:Future Appointments Date Time Provider Department Center  11/02/2016 9:00 AM Roe Coombs, CNM [facility] None   Follow up Visit:No Follow-up on file.  Postpartum contraception: Nexplanon  Newborn Data: Live born female  Birth Weight: 7 lb 8.3 oz (3410 g) APGAR: 8, 9  Baby Feeding: Breast Disposition:home with mother   10/08/2016 Roe Coombs, CNM

## 2016-10-08 NOTE — Discharge Instructions (Signed)

## 2016-10-08 NOTE — Progress Notes (Signed)
Post Partum Day #2 Subjective: no complaints, up ad lib, voiding and tolerating PO  Objective: Blood pressure (!) 120/53, pulse 67, temperature 98.2 F (36.8 C), temperature source Oral, resp. rate 18, height 5\' 4"  (1.626 m), weight 278 lb (126.1 kg), last menstrual period 12/23/2015, SpO2 100 %, unknown if currently breastfeeding.  Physical Exam:  General: alert, cooperative and no distress Lochia: appropriate Uterine Fundus: firm Incision: N/A DVT Evaluation: No evidence of DVT seen on physical exam. No cords or calf tenderness. No significant calf/ankle edema.   Recent Labs  10/06/16 0148 10/07/16 0526  HGB 11.4* 10.6*  HCT 33.3* 30.7*    Assessment/Plan: Discharge home, Breastfeeding and Contraception Nexplanon   LOS: 2 days   Sarah [name], CNM 10/08/2016, 7:54 AM

## 2016-10-10 LAB — BPAM RBC
Blood Product Expiration Date: 201804042359
Blood Product Expiration Date: 201804042359
Blood Product Expiration Date: 201804042359
UNIT TYPE AND RH: 5100
UNIT TYPE AND RH: 5100
Unit Type and Rh: 5100

## 2016-10-10 LAB — TYPE AND SCREEN
ABO/RH(D): B POS
Antibody Screen: NEGATIVE
Unit division: 0
Unit division: 0
Unit division: 0

## 2016-10-16 ENCOUNTER — Ambulatory Visit (HOSPITAL_COMMUNITY)
Admission: EM | Admit: 2016-10-16 | Discharge: 2016-10-16 | Disposition: A | Discharge status: Home / Self Care | Payer: Medicaid Other | Attending: Emergency Medicine | Admitting: Emergency Medicine

## 2016-10-16 ENCOUNTER — Encounter (HOSPITAL_COMMUNITY): Payer: Self-pay | Admitting: *Deleted

## 2016-10-16 DIAGNOSIS — M26621 Arthralgia of right temporomandibular joint: Secondary | ICD-10-CM

## 2016-10-16 DIAGNOSIS — H9201 Otalgia, right ear: Secondary | ICD-10-CM

## 2016-10-16 MED ORDER — CYCLOBENZAPRINE HCL 10 MG PO TABS: 10.0000 mg | ORAL_TABLET | Freq: Every day | ORAL | 0 refills | Status: DC

## 2016-10-16 MED ORDER — NEOMYCIN-POLYMYXIN-HC 3.5-10000-1 OT SUSP: 4.0000 [drp] | Freq: Four times a day (QID) | OTIC | 0 refills | Status: DC

## 2016-10-16 MED ORDER — PREDNISONE 20 MG PO TABS: 40.0000 mg | ORAL_TABLET | Freq: Every day | ORAL | 0 refills | Status: AC

## 2016-10-16 NOTE — Discharge Instructions (Signed)
Try the prednisone, ibuprofen with Tylenol 3-4 times a day and the Flexeril first. If this does not improve your symptoms then start the eardrops.

## 2016-10-16 NOTE — ED Provider Notes (Signed)
HPI  SUBJECTIVE:  Sarah [name] is a 36 y.o. female who presents with throbbing constant right ear pain starting this morning. She reports some otorrhea, nasal congestion. States that she cleaned her ears with Q-tips 2 days ago. She also reports a headache. She tried ibuprofen 600 mg which did not help her symptoms. There are no aggravating or alleviating factors. She denies change in hearing, rhinorrhea, post nasal drip, allergy symptoms. No fevers. No trauma to her ear, exposure to loud noise, recent swimming. No sore throat. She does not grind her teeth at night. She does chew gum. She was on antibiotics recently for postpartum hemorrhage. She has taken any antipyretic, ibuprofen, and the past 6-8 hours. She has past medical history negative for diabetes, hypertension, otitis media. PMD: Dr. Clearance CootsHarper.    Past Medical History:  Diagnosis Date  . Anemia   . Blood transfusion without reported diagnosis    2 units after 2011 delivery  . Depression    pp with all deliveries no meds  . Hx of chlamydia infection   . Hx of gonorrhea   . Trichomonas contact, treated   . Vaginal Pap smear, abnormal    had colpo 2016    Past Surgical History:  Procedure Laterality Date  . DILATION AND CURETTAGE OF UTERUS    . VAGINAL DELIVERY    . WISDOM TOOTH EXTRACTION      Family History  Problem Relation Age of Onset  . Adopted: Yes  . Anesthesia problems Neg Hx   . Hypotension Neg Hx   . Malignant hyperthermia Neg Hx   . Pseudochol deficiency Neg Hx     Social History  Substance Use Topics  . Smoking status: Never Smoker  . Smokeless tobacco: Never Used  . Alcohol use No    No current facility-administered medications for this encounter.   Current Outpatient Prescriptions:  .  cyclobenzaprine (FLEXERIL) 10 MG tablet, Take 1 tablet (10 mg total) by mouth at bedtime., Disp: 20 tablet, Rfl: 0 .  ibuprofen (ADVIL,MOTRIN) 600 MG tablet, Take 1 tablet (600 mg total) by mouth every 6 (six)  hours., Disp: 120 tablet, Rfl: 2 .  neomycin-polymyxin-hydrocortisone (CORTISPORIN) 3.5-10000-1 otic suspension, Place 4 drops into the right ear 4 (four) times daily. 3-4 times a day X 5-10 days, Disp: 10 mL, Rfl: 0 .  predniSONE (DELTASONE) 20 MG tablet, Take 2 tablets (40 mg total) by mouth daily with breakfast., Disp: 10 tablet, Rfl: 0 .  Prenat-FeFmCb-DSS-FA-DHA w/o A (CITRANATAL HARMONY) 27-1-260 MG CAPS, Take 1 capsule by mouth daily before breakfast., Disp: 90 capsule, Rfl: 3 .  Prenatal-DSS-FeCb-FeGl-FA (CITRANATAL BLOOM) 90-1 MG TABS, Take 1 tablet by mouth at bedtime., Disp: 30 tablet, Rfl: 6 .  senna-docusate (SENOKOT-S) 8.6-50 MG tablet, Take 2 tablets by mouth at bedtime., Disp: 60 tablet, Rfl: 2  Allergies  Allergen Reactions  . Latex Anaphylaxis and Hives  . Mushroom Extract Complex Anaphylaxis     ROS  As noted in HPI.   Physical Exam  BP 128/88 (BP Location: Right Arm)   Pulse 78   Temp 98.6 F (37 C) (Oral)   Resp 18   SpO2 100%   Breastfeeding? Yes   Constitutional: Well developed, well nourished, no acute distress Eyes:  EOMI, conjunctiva normal bilaterally HENT: Normocephalic, atraumatic,mucus membranes moist. . Right external ear and external ear canal normal. No pain with traction on pinna. No tenderness, swelling, erythema over the mastoid. Positive pain with palpation of the tragus. Positive tenderness on the right  TMJ, no clicking, crepitus, trismus. Right TM injected, but intact. No dullness bulging or air fluid level seen. Left external ear and TM normal. Positive rhinorrhea. Normal oropharynx. Neck: No cervical adenopathy Respiratory: Normal inspiratory effort Cardiovascular: Normal rate GI: nondistended skin: No rash, skin intact Musculoskeletal: no deformities Neurologic: Alert & oriented x 3, no focal neuro deficits Psychiatric: Speech and behavior appropriate   ED Course   Medications - No data to display  No orders of the defined types  were placed in this encounter.   No results found for this or any previous visit (from the past 24 hour(s)). No results found.  ED Clinical Impression  Right ear pain  Arthralgia of right temporomandibular joint   ED Assessment/Plan  Presentation consistent with otalgia from her TMJ joint. Also in differential is external otitis given that she recently cleaned her ears and reports some otorrhea. There is no evidence of otitis media or tympanic membrane perforation. Plan to send home with ibuprofen 600 mg 1 g of Tylenol 3-4 times a day, patient states she has plenty  of ibuprofen left over, Flexeril,  40 mg prednisone for 5 days. She is breast-feeding advised her to breast feed 4 hours after taking the prednisone. Also will prescribe some cortisporin eardrops in case these measures do not work. She'll follow up with her primary care physician as needed.  Discussed  MDM, plan and followup with patient. . Patient agrees with plan.   Meds ordered this encounter  Medications  . cyclobenzaprine (FLEXERIL) 10 MG tablet    Sig: Take 1 tablet (10 mg total) by mouth at bedtime.    Dispense:  20 tablet    Refill:  0  . predniSONE (DELTASONE) 20 MG tablet    Sig: Take 2 tablets (40 mg total) by mouth daily with breakfast.    Dispense:  10 tablet    Refill:  0  . neomycin-polymyxin-hydrocortisone (CORTISPORIN) 3.5-10000-1 otic suspension    Sig: Place 4 drops into the right ear 4 (four) times daily. 3-4 times a day X 5-10 days    Dispense:  10 mL    Refill:  0    *This clinic note was created using Scientist, clinical (histocompatibility and immunogenetics). Therefore, there may be occasional mistakes despite careful proofreading.  ?   Domenick Gong, MD 10/16/16 [phone]

## 2016-10-16 NOTE — ED Triage Notes (Signed)
Pt  States  She  Woke  With  r  Earache      She  States       She  Has  Pain r    Side  Of her  Face     She  Is  Sitting upright on the  Exam table  Speaking in   complete sentances

## 2016-11-02 ENCOUNTER — Ambulatory Visit: Payer: Medicaid Other | Admitting: Certified Nurse Midwife

## 2017-01-01 NOTE — Addendum Note (Signed)
Addendum  created 01/01/17 1100 by Shelton SilvasHollis, Jancarlo Biermann D, MD   Sign clinical note

## 2017-03-03 DIAGNOSIS — K0889 Other specified disorders of teeth and supporting structures: Secondary | ICD-10-CM

## 2017-03-03 HISTORY — DX: Other specified disorders of teeth and supporting structures: K08.89

## 2017-03-19 ENCOUNTER — Encounter (HOSPITAL_COMMUNITY): Payer: Self-pay

## 2017-03-19 ENCOUNTER — Ambulatory Visit (HOSPITAL_COMMUNITY): Admit: 2017-03-19 | Payer: Medicaid Other | Admitting: Oral Surgery

## 2017-03-19 SURGERY — DENTAL RESTORATION/EXTRACTIONS
Anesthesia: General

## 2017-03-23 ENCOUNTER — Encounter (HOSPITAL_BASED_OUTPATIENT_CLINIC_OR_DEPARTMENT_OTHER): Payer: Self-pay | Admitting: *Deleted

## 2017-03-23 NOTE — Pre-Procedure Instructions (Signed)
To come for anesthesia airway evaluation. 

## 2017-03-24 NOTE — Pre-Procedure Instructions (Signed)
Patient here and evaluated by Dr Renold Don. Ok to proceed with planned surgery

## 2017-03-25 ENCOUNTER — Ambulatory Visit (HOSPITAL_BASED_OUTPATIENT_CLINIC_OR_DEPARTMENT_OTHER): Payer: Medicaid Other | Admitting: Anesthesiology

## 2017-03-25 ENCOUNTER — Encounter (HOSPITAL_BASED_OUTPATIENT_CLINIC_OR_DEPARTMENT_OTHER)
Admission: RE | Disposition: A | Discharge status: Home / Self Care | Payer: Self-pay | Source: Ambulatory Visit | Attending: Oral Surgery

## 2017-03-25 ENCOUNTER — Ambulatory Visit (HOSPITAL_BASED_OUTPATIENT_CLINIC_OR_DEPARTMENT_OTHER)
Admission: RE | Admit: 2017-03-25 | Discharge: 2017-03-25 | Disposition: A | Discharge status: Home / Self Care | Payer: Medicaid Other | Source: Ambulatory Visit | Attending: Oral Surgery | Admitting: Oral Surgery

## 2017-03-25 ENCOUNTER — Encounter (HOSPITAL_BASED_OUTPATIENT_CLINIC_OR_DEPARTMENT_OTHER): Payer: Self-pay

## 2017-03-25 DIAGNOSIS — K0889 Other specified disorders of teeth and supporting structures: Secondary | ICD-10-CM | POA: Diagnosis not present

## 2017-03-25 DIAGNOSIS — Z91040 Latex allergy status: Secondary | ICD-10-CM | POA: Insufficient documentation

## 2017-03-25 DIAGNOSIS — Z6841 Body Mass Index (BMI) 40.0 and over, adult: Secondary | ICD-10-CM | POA: Insufficient documentation

## 2017-03-25 DIAGNOSIS — Z91018 Allergy to other foods: Secondary | ICD-10-CM | POA: Insufficient documentation

## 2017-03-25 DIAGNOSIS — F329 Major depressive disorder, single episode, unspecified: Secondary | ICD-10-CM | POA: Insufficient documentation

## 2017-03-25 HISTORY — DX: Other specified disorders of teeth and supporting structures: K08.89

## 2017-03-25 HISTORY — PX: TOOTH EXTRACTION: SHX859

## 2017-03-25 SURGERY — DENTAL RESTORATION/EXTRACTIONS
Anesthesia: General | Laterality: Bilateral

## 2017-03-25 MED ORDER — OXYCODONE-ACETAMINOPHEN 5-325 MG PO TABS
ORAL_TABLET | ORAL | Status: AC
  Filled 2017-03-25: qty 1

## 2017-03-25 MED ORDER — 0.9 % SODIUM CHLORIDE (POUR BTL) OPTIME
TOPICAL | Status: DC | PRN
  Administered 2017-03-25: 500 mL

## 2017-03-25 MED ORDER — FENTANYL CITRATE (PF) 100 MCG/2ML IJ SOLN
50.0000 ug | INTRAMUSCULAR | Status: DC | PRN
  Administered 2017-03-25 (×2): 100 ug via INTRAVENOUS

## 2017-03-25 MED ORDER — ONDANSETRON HCL 4 MG/2ML IJ SOLN
INTRAMUSCULAR | Status: AC
  Filled 2017-03-25: qty 2

## 2017-03-25 MED ORDER — MIDAZOLAM HCL 2 MG/2ML IJ SOLN
1.0000 mg | INTRAMUSCULAR | Status: DC | PRN
  Administered 2017-03-25: 2 mg via INTRAVENOUS

## 2017-03-25 MED ORDER — DEXTROSE 5 % IV SOLN
3.0000 g | INTRAVENOUS | Status: AC
  Administered 2017-03-25: 3 g via INTRAVENOUS

## 2017-03-25 MED ORDER — LIDOCAINE 2% (20 MG/ML) 5 ML SYRINGE
INTRAMUSCULAR | Status: DC | PRN
  Administered 2017-03-25: 100 mg via INTRAVENOUS

## 2017-03-25 MED ORDER — CEFAZOLIN SODIUM-DEXTROSE 2-4 GM/100ML-% IV SOLN
INTRAVENOUS | Status: AC
  Filled 2017-03-25: qty 200

## 2017-03-25 MED ORDER — HYDROMORPHONE HCL 1 MG/ML IJ SOLN
INTRAMUSCULAR | Status: AC
  Filled 2017-03-25: qty 0.5

## 2017-03-25 MED ORDER — DEXAMETHASONE SODIUM PHOSPHATE 4 MG/ML IJ SOLN
INTRAMUSCULAR | Status: DC | PRN
  Administered 2017-03-25: 10 mg via INTRAVENOUS

## 2017-03-25 MED ORDER — SCOPOLAMINE 1 MG/3DAYS TD PT72: 1.0000 | MEDICATED_PATCH | Freq: Once | TRANSDERMAL | Status: DC | PRN

## 2017-03-25 MED ORDER — MIDAZOLAM HCL 2 MG/2ML IJ SOLN
INTRAMUSCULAR | Status: AC
  Filled 2017-03-25: qty 2

## 2017-03-25 MED ORDER — PROPOFOL 10 MG/ML IV BOLUS
INTRAVENOUS | Status: DC | PRN
  Administered 2017-03-25: 200 mg via INTRAVENOUS

## 2017-03-25 MED ORDER — LACTATED RINGERS IV SOLN
INTRAVENOUS | Status: DC
  Administered 2017-03-25 (×2): via INTRAVENOUS

## 2017-03-25 MED ORDER — OXYCODONE-ACETAMINOPHEN 5-325 MG PO TABS
1.0000 | ORAL_TABLET | Freq: Once | ORAL | Status: AC
  Administered 2017-03-25: 1 via ORAL

## 2017-03-25 MED ORDER — SUCCINYLCHOLINE CHLORIDE 20 MG/ML IJ SOLN
INTRAMUSCULAR | Status: DC | PRN
  Administered 2017-03-25: 140 mg via INTRAVENOUS

## 2017-03-25 MED ORDER — ONDANSETRON HCL 4 MG/2ML IJ SOLN
INTRAMUSCULAR | Status: DC | PRN
  Administered 2017-03-25: 4 mg via INTRAVENOUS

## 2017-03-25 MED ORDER — DEXAMETHASONE SODIUM PHOSPHATE 10 MG/ML IJ SOLN
INTRAMUSCULAR | Status: AC
  Filled 2017-03-25: qty 1

## 2017-03-25 MED ORDER — AMOXICILLIN 500 MG PO CAPS: 500.0000 mg | ORAL_CAPSULE | Freq: Three times a day (TID) | ORAL | 0 refills | Status: DC

## 2017-03-25 MED ORDER — LIDOCAINE-EPINEPHRINE 2 %-1:100000 IJ SOLN
INTRAMUSCULAR | Status: DC | PRN
  Administered 2017-03-25: 18 mL

## 2017-03-25 MED ORDER — OXYCODONE-ACETAMINOPHEN 5-325 MG PO TABS: 1.0000 | ORAL_TABLET | ORAL | 0 refills | Status: DC | PRN

## 2017-03-25 MED ORDER — HYDROMORPHONE HCL 1 MG/ML IJ SOLN
0.2500 mg | INTRAMUSCULAR | Status: DC | PRN
  Administered 2017-03-25 (×2): 0.25 mg via INTRAVENOUS

## 2017-03-25 MED ORDER — FENTANYL CITRATE (PF) 100 MCG/2ML IJ SOLN
INTRAMUSCULAR | Status: AC
  Filled 2017-03-25: qty 2

## 2017-03-25 SURGICAL SUPPLY — 43 items
BLADE CRESCENTIC 13.5X.38X32 (BLADE) ×3 IMPLANT
BLADE SURG 15 STRL LF DISP TIS (BLADE) ×1 IMPLANT
BLADE SURG 15 STRL SS (BLADE) ×3
BUR CROSS CUT FISSURE 1.6 (BURR) IMPLANT
BUR CROSS CUT FISSURE 1.6MM (BURR)
BUR EGG ELITE 4.0 (BURR) IMPLANT
BUR EGG ELITE 4.0MM (BURR)
CANISTER SUCT 1200ML W/VALVE (MISCELLANEOUS) ×3 IMPLANT
CATH ROBINSON RED A/P 10FR (CATHETERS) IMPLANT
CLOSURE WOUND 1/2 X4 (GAUZE/BANDAGES/DRESSINGS)
COVER BACK TABLE 60X90IN (DRAPES) ×3 IMPLANT
COVER MAYO STAND STRL (DRAPES) ×3 IMPLANT
DECANTER SPIKE VIAL GLASS SM (MISCELLANEOUS) IMPLANT
DRAPE U-SHAPE 76X120 STRL (DRAPES) ×3 IMPLANT
GAUZE PACKING FOLDED 2  STR (GAUZE/BANDAGES/DRESSINGS) ×2
GAUZE PACKING FOLDED 2 STR (GAUZE/BANDAGES/DRESSINGS) ×1 IMPLANT
GAUZE PACKING IODOFORM 1/4X15 (GAUZE/BANDAGES/DRESSINGS) IMPLANT
GLOVE BIO SURGEON STRL SZ 6.5 (GLOVE) ×2 IMPLANT
GLOVE BIO SURGEON STRL SZ7.5 (GLOVE) ×3 IMPLANT
GLOVE BIO SURGEONS STRL SZ 6.5 (GLOVE) ×1
GOWN STRL REUS W/ TWL LRG LVL3 (GOWN DISPOSABLE) ×1 IMPLANT
GOWN STRL REUS W/TWL LRG LVL3 (GOWN DISPOSABLE) ×3
IV NS 500ML (IV SOLUTION) ×3
IV NS 500ML BAXH (IV SOLUTION) ×1 IMPLANT
NEEDLE HYPO 22GX1.5 SAFETY (NEEDLE) ×3 IMPLANT
NS IRRIG 1000ML POUR BTL (IV SOLUTION) ×3 IMPLANT
PACK BASIN DAY SURGERY FS (CUSTOM PROCEDURE TRAY) ×3 IMPLANT
SLEEVE SCD COMPRESS KNEE MED (MISCELLANEOUS) IMPLANT
SPONGE SURGIFOAM ABS GEL 12-7 (HEMOSTASIS) IMPLANT
STRIP CLOSURE SKIN 1/2X4 (GAUZE/BANDAGES/DRESSINGS) IMPLANT
SUT CHROMIC 3 0 PS 2 (SUTURE) ×7 IMPLANT
SYR 20CC LL (SYRINGE) IMPLANT
SYR BULB 3OZ (MISCELLANEOUS) ×3 IMPLANT
SYR CONTROL 10ML LL (SYRINGE) ×5 IMPLANT
TOOTHBRUSH ADULT (PERSONAL CARE ITEMS) IMPLANT
TOWEL OR 17X24 6PK STRL BLUE (TOWEL DISPOSABLE) ×3 IMPLANT
TOWEL OR NON WOVEN STRL DISP B (DISPOSABLE) ×3 IMPLANT
TRAY DSU PREP LF (CUSTOM PROCEDURE TRAY) IMPLANT
TUBE CONNECTING 20'X1/4 (TUBING) ×1
TUBE CONNECTING 20X1/4 (TUBING) ×2 IMPLANT
TUBING IRRIGATION (MISCELLANEOUS) ×3 IMPLANT
WATER STERILE IRR 1000ML POUR (IV SOLUTION) ×3 IMPLANT
YANKAUER SUCT BULB TIP NO VENT (SUCTIONS) ×5 IMPLANT

## 2017-03-25 NOTE — Op Note (Signed)
03/25/2017  12:55 PM  PATIENT:  Sarah [name]  36 y.o. female  PRE-OPERATIVE DIAGNOSIS:  NONRESTORABLE TEETH  #3, 5, 11, 12, 19, 21, 22, 28, BILATERAL MANDIBULAR TORI  POST-OPERATIVE DIAGNOSIS:  SAME  PROCEDURE:  Procedure(s) /EXTRACTIONS  TEETH  #3, 5, 11, 12, 19, 21, 22, 28, ALVEOLOPLASTY, REMOVAL BILATERAL MANDIBULAR TORI  SURGEON:  Surgeon(s): Ocie Doyne, DDS  ANESTHESIA:   local and general  EBL:  minimal  DRAINS: none   SPECIMEN:  No Specimen  COUNTS:  YES  PLAN OF CARE: Discharge to home after PACU  PATIENT DISPOSITION:  PACU - hemodynamically stable.   PROCEDURE DETAILS: Dictation # [id number]  [name], DMD 03/25/2017 12:55 PM

## 2017-03-25 NOTE — Anesthesia Preprocedure Evaluation (Signed)
Anesthesia Evaluation  Patient identified by MRN, date of birth, ID band Patient awake    Reviewed: Allergy & Precautions, NPO status , Patient's Chart, lab work & pertinent test results  Airway Mallampati: II  TM Distance: >3 FB     Dental   Pulmonary neg pulmonary ROS,    breath sounds clear to auscultation       Cardiovascular negative cardio ROS   Rhythm:Regular Rate:Normal     Neuro/Psych    GI/Hepatic negative GI ROS, Neg liver ROS,   Endo/Other  negative endocrine ROS  Renal/GU negative Renal ROS     Musculoskeletal   Abdominal   Peds  Hematology   Anesthesia Other Findings   Reproductive/Obstetrics                             Anesthesia Physical Anesthesia Plan  ASA: I  Anesthesia Plan: General   Post-op Pain Management:    Induction: Intravenous  PONV Risk Score and Plan: 3 and Ondansetron, Dexamethasone, Midazolam and Propofol infusion  Airway Management Planned: Nasal ETT  Additional Equipment:   Intra-op Plan:   Post-operative Plan: Extubation in OR  Informed Consent: I have reviewed the patients History and Physical, chart, labs and discussed the procedure including the risks, benefits and alternatives for the proposed anesthesia with the patient or authorized representative who has indicated his/her understanding and acceptance.   Dental advisory given  Plan Discussed with: CRNA and Anesthesiologist  Anesthesia Plan Comments:         Anesthesia Quick Evaluation

## 2017-03-25 NOTE — H&P (Signed)
HISTORY AND PHYSICAL  Sarah [name] is a 36 y.o. female patient with CC: painful teeth  No diagnosis found.  Past Medical History:  Diagnosis Date  . Depression    pp with all deliveries no meds  . Nonrestorable tooth 03/2017   teeth    Current Facility-Administered Medications  Medication Dose Route Frequency Provider Last Rate Last Dose  . ceFAZolin (ANCEF) 3 g in dextrose 5 % 50 mL IVPB  3 g Intravenous On Call to OR Ocie Doyne, DDS      . fentaNYL (SUBLIMAZE) injection 50-100 mcg  50-100 mcg Intravenous PRN Cecile Hearing, MD      . lactated ringers infusion   Intravenous Continuous Cecile Hearing, MD 10 mL/hr at 03/25/17 1025    . midazolam (VERSED) injection 1-2 mg  1-2 mg Intravenous PRN Cecile Hearing, MD      . scopolamine (TRANSDERM-SCOP) 1 MG/3DAYS 1.5 mg  1 patch Transdermal Once PRN Cecile Hearing, MD       Allergies  Allergen Reactions  . Latex Hives and Shortness Of Breath  . Mushroom Extract Complex Hives, Shortness Of Breath and Swelling   Active Problems:   * No active hospital problems. *  Vitals: Blood pressure 136/86, pulse (!) 55, temperature 98 F (36.7 C), temperature source Oral, resp. rate 18, height 5\' 4"  (1.626 m), weight 265 lb (120.2 kg), last menstrual period 03/08/2017, SpO2 100 %, not currently breastfeeding. Lab results:No results found for this or any previous visit (from the past 24 hour(s)). Radiology Results: No results found. General appearance: alert, cooperative and morbidly obese Head: Normocephalic, without obvious abnormality, atraumatic Eyes: negative Nose: Nares normal. Septum midline. Mucosa normal. No drainage or sinus tenderness. Throat: multiple decayed teeth. bilateral mandibular lingual tori. pharynx clear Neck: no adenopathy, supple, symmetrical, trachea midline and thyroid not enlarged, symmetric, no tenderness/mass/nodules Resp: clear to auscultation bilaterally Cardio: regular rate and  rhythm, S1, S2 normal, no murmur, click, rub or gallop  Assessment: multiple nonrestorable teeth secondary to dental caries, bilateral mandibular lingual tori  Plan: multiple dental extractions; remove bilateral tori. GA. Nasal.Day surgery.   Sarah [name] 03/25/2017

## 2017-03-25 NOTE — Transfer of Care (Signed)
Immediate Anesthesia Transfer of Care Note  Patient: Sarah [name]  Procedure(s) Performed: Procedure(s): DENTAL RESTORATION/EXTRACTIONS X8 (Bilateral)  Patient Location: PACU  Anesthesia Type:General  Level of Consciousness: sedated  Airway & Oxygen Therapy: Patient Spontanous Breathing and Patient connected to face mask oxygen  Post-op Assessment: Report given to RN and Post -op Vital signs reviewed and stable  Post vital signs: Reviewed and stable  Last Vitals:  Vitals:   03/25/17 1003  BP: 136/86  Pulse: (!) 55  Resp: 18  Temp: 36.7 C  SpO2: 100%    Last Pain:  Vitals:   03/25/17 1003  TempSrc: Oral         Complications: No apparent anesthesia complications

## 2017-03-25 NOTE — Op Note (Signed)
NAME:  Sarah [name], Sarah [name] NO.:  [id number]  MEDICAL RECORD NO.:  [id number]  LOCATION:                                 FACILITY:  PHYSICIAN:  [name], M.D.       DATE OF BIRTH:  DATE OF PROCEDURE:  03/25/2017 DATE OF DISCHARGE:                              OPERATIVE REPORT   PREOPERATIVE DIAGNOSES:  Nonrestorable teeth numbers 3, 5, 11, 12, 19, 21, 22, 28, bilateral mandibular lingual tori.  POSTOPERATIVE DIAGNOSES:  Nonrestorable teeth numbers 3, 5, 11, 12, 19, 21, 22, 28, bilateral mandibular lingual tori.  PROCEDURES:  Extraction of teeth numbers 3, 5, 11, 12, 19, 21, 22, 28; alveoplasty of the left mandible; removal of bilateral mandibular lingual tori.  SURGEON:  [name], M.D.  ANESTHESIA:  General, nasal intubation.  DESCRIPTION OF PROCEDURE:  The patient was taken to the operating room, placed on the table in the supine position.  General anesthesia was induced and a nasal endotracheal tube was placed and secured.  The eyes were protected and the patient was draped for the procedure.  Time-out was performed.  The posterior pharynx was suctioned and a throat pack was placed.  A 2% lidocaine with 1:100,000 epinephrine was infiltrated in an inferior-alveolar block on the right and left side and buccal and palatal infiltration around the teeth to be removed.  A bite block was placed on the right side of the mouth and a Sweetheart retractor was used to retract the tongue.  A #15 blade was used to make an incision buccally and lingually around teeth numbers 19, 21, and 22.  This was carried forward onto the lingual surfaces of teeth #23 and posteriorly in the proximal mandible approximately 1 cm behind tooth #19.  In the maxilla, an incision was created buccally and palatally around teeth numbers 11 and 12.  The periosteum was reflected with a periosteal elevator and then tooth #19 was removed using the lower dental forceps. Teeth numbers 21  and 22 removed using the Asch forceps.  Then, the periosteum was reflected further to expose the alveolar crest and using an egg-shaped bur and bone file, alveoplasty was performed on the buccal surface of the alveolar ridge.  This Seldin retractor was used to obtain access in the lingual aspect.  The left lingual torus was then removed using the egg-shaped bur and bone file.  Then, the area was irrigated and closed with 3-0 chromic.  Tooth #12 was removed using a 301 elevator and then the dental forceps.  Bone was removed around tooth #11 circumferentially to allow for removal of this tooth with a 301 elevator.  Then, the upper sockets were curetted, irrigated, and closed with 3-0 chromic.  The Sweetheart retractor and bite block were repositioned to the other side of the mouth.  A 15 blade was used to make an incision circumferentially around teeth numbers 3, 5, and 28 and a crestal incision was created on the right mandible.  The periosteum was reflected.  The teeth were elevated with a 301 elevator.  Tooth #28 required removal of bone after reflecting the periosteal flap, so that the root could be accessed and then  the root was removed using a 301 elevator.  Tooth #3 was removed in fragments and required sectioning and bone removal.  Tooth #5 was removed with the upper universal forceps. The socket was then curetted in the maxilla.  3-0 chromic was used to close these areas and in the mandible, the periosteum was reflected lingually to expose the mandibular torus.  The torus was then reduced using an egg-shaped bur and further smoothed with a bone file.  Then, the mandibular incision was irrigated and closed with 3-0 chromic.  The oral cavity was then irrigated and suctioned.  Throat pack was removed. The patient was left in the care of Anesthesia for awakening and transportation to recovery and later discharged home.  ESTIMATED BLOOD LOSS:  Minimal.  COMPLICATIONS:   None.  SPECIMENS:  None.     [name], M.D.   ______________________________ [name], M.D.    SMJ/MEDQ  D:  03/25/2017  T:  03/25/2017  Job:  [id number]

## 2017-03-25 NOTE — Anesthesia Procedure Notes (Signed)
Procedure Name: Intubation Date/Time: 03/25/2017 12:13 PM Performed by: Maryella Shivers Pre-anesthesia Checklist: Patient identified, Emergency Drugs available, Suction available and Patient being monitored Patient Re-evaluated:Patient Re-evaluated prior to induction Oxygen Delivery Method: Circle system utilized Preoxygenation: Pre-oxygenation with 100% oxygen Induction Type: IV induction Ventilation: Mask ventilation without difficulty Laryngoscope Size: Mac and 3 Nasal Tubes: Nasal prep performed, Nasal Rae, Left and Magill forceps- large, utilized Tube size: 6.5 mm Placement Confirmation: ETT inserted through vocal cords under direct vision,  positive ETCO2 and breath sounds checked- equal and bilateral Secured at: 22 cm Tube secured with: Tape Dental Injury: Teeth and Oropharynx as per pre-operative assessment

## 2017-03-25 NOTE — Discharge Instructions (Signed)
°  Post Anesthesia Home Care Instructions  Activity: Get plenty of rest for the remainder of the day. A responsible individual must stay with you for 24 hours following the procedure.  For the next 24 hours, DO NOT: -Drive a car -Advertising copywriter -Drink alcoholic beverages -Take any medication unless instructed by your physician -Make any legal decisions or sign important papers.  Meals: Start with liquid foods such as gelatin or soup. Progress to regular foods as tolerated. Avoid greasy, spicy, heavy foods. If nausea and/or vomiting occur, drink only clear liquids until the nausea and/or vomiting subsides. Call your physician if vomiting continues.  Special Instructions/Symptoms: Your throat may feel dry or sore from the anesthesia or the breathing tube placed in your throat during surgery. If this causes discomfort, gargle with warm salt water. The discomfort should disappear within 24 hours.  If you had a scopolamine patch placed behind your ear for the management of post- operative nausea and/or vomiting:  1. The medication in the patch is effective for 72 hours, after which it should be removed.  Wrap patch in a tissue and discard in the trash. Wash hands thoroughly with soap and water. 2. You may remove the patch earlier than 72 hours if you experience unpleasant side effects which may include dry mouth, dizziness or visual disturbances. 3. Avoid touching the patch. Wash your hands with soap and water after contact with the patch.    *Instructed pt to "pump and dump" x 24 hours per Dr. Neva Seat and refer to OBGYN regarding breastfeeding while taking narcotic pain medication

## 2017-03-25 NOTE — Anesthesia Postprocedure Evaluation (Signed)
Anesthesia Post Note  Patient: Sarah [name]  Procedure(s) Performed: Procedure(s) (LRB): DENTAL RESTORATION/EXTRACTIONS X8 (Bilateral)     Patient location during evaluation: PACU Anesthesia Type: General Level of consciousness: awake Pain management: pain level controlled Vital Signs Assessment: post-procedure vital signs reviewed and stable Respiratory status: spontaneous breathing Cardiovascular status: stable Postop Assessment: no signs of nausea or vomiting Anesthetic complications: no    Last Vitals:  Vitals:   03/25/17 1524 03/25/17 1539  BP: (!) 143/85   Pulse: 79 87  Resp: 18   Temp: 37 C   SpO2: 96% 98%    Last Pain:  Vitals:   03/25/17 1524  TempSrc: Oral  PainSc: 5                  Gwynn Crossley

## 2017-03-26 ENCOUNTER — Encounter (HOSPITAL_BASED_OUTPATIENT_CLINIC_OR_DEPARTMENT_OTHER): Payer: Self-pay | Admitting: Oral Surgery

## 2017-05-14 ENCOUNTER — Ambulatory Visit: Admit: 2017-05-14 | Payer: Medicaid Other | Admitting: Oral Surgery

## 2017-05-14 SURGERY — MULTIPLE EXTRACTION WITH ALVEOLOPLASTY
Anesthesia: General

## 2017-09-18 ENCOUNTER — Emergency Department
Admission: EM | Admit: 2017-09-18 | Discharge: 2017-09-18 | Disposition: A | Discharge status: Home / Self Care | Payer: Medicaid Other | Attending: Emergency Medicine | Admitting: Emergency Medicine

## 2017-09-18 ENCOUNTER — Encounter: Payer: Self-pay | Admitting: Emergency Medicine

## 2017-09-18 ENCOUNTER — Other Ambulatory Visit: Payer: Self-pay

## 2017-09-18 DIAGNOSIS — N898 Other specified noninflammatory disorders of vagina: Secondary | ICD-10-CM | POA: Diagnosis present

## 2017-09-18 DIAGNOSIS — B373 Candidiasis of vulva and vagina: Secondary | ICD-10-CM | POA: Diagnosis not present

## 2017-09-18 DIAGNOSIS — B3731 Acute candidiasis of vulva and vagina: Secondary | ICD-10-CM

## 2017-09-18 LAB — URINALYSIS, COMPLETE (UACMP) WITH MICROSCOPIC
BILIRUBIN URINE: NEGATIVE
Bacteria, UA: NONE SEEN
GLUCOSE, UA: NEGATIVE mg/dL
HGB URINE DIPSTICK: NEGATIVE
KETONES UR: NEGATIVE mg/dL
LEUKOCYTES UA: NEGATIVE
NITRITE: NEGATIVE
PROTEIN: NEGATIVE mg/dL
Specific Gravity, Urine: 1.015 (ref 1.005–1.030)
pH: 5 (ref 5.0–8.0)

## 2017-09-18 LAB — WET PREP, GENITAL
Clue Cells Wet Prep HPF POC: NONE SEEN
Sperm: NONE SEEN
Trich, Wet Prep: NONE SEEN

## 2017-09-18 LAB — CHLAMYDIA/NGC RT PCR (ARMC ONLY)
Chlamydia Tr: NOT DETECTED
N gonorrhoeae: NOT DETECTED

## 2017-09-18 LAB — POCT PREGNANCY, URINE: Preg Test, Ur: NEGATIVE

## 2017-09-18 MED ORDER — MICONAZOLE NITRATE 1200 & 2 MG & % VA KIT: 1.0000 | PACK | Freq: Once | VAGINAL | 0 refills | Status: DC

## 2017-09-18 MED ORDER — FLUCONAZOLE 150 MG PO TABS: 150.0000 mg | ORAL_TABLET | Freq: Once | ORAL | 0 refills | Status: AC
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
## 2017-09-18 NOTE — Discharge Instructions (Signed)
Your exam confirmed skin irritation and vaginitis due to yeast. Use the vaginal cream as directed. Consider taking an OTC probiotic to reduce symptoms. See your provider for worsening symptoms.

## 2017-09-19 ENCOUNTER — Encounter: Payer: Self-pay | Admitting: Physician Assistant

## 2019-06-24 ENCOUNTER — Emergency Department
Admission: EM | Admit: 2019-06-24 | Discharge: 2019-06-25 | Disposition: A | Discharge status: Home / Self Care | Payer: Medicaid Other | Attending: Emergency Medicine | Admitting: Emergency Medicine

## 2019-06-24 ENCOUNTER — Other Ambulatory Visit: Payer: Self-pay

## 2019-06-24 DIAGNOSIS — Z23 Encounter for immunization: Secondary | ICD-10-CM | POA: Diagnosis not present

## 2019-06-24 DIAGNOSIS — Y999 Unspecified external cause status: Secondary | ICD-10-CM | POA: Diagnosis not present

## 2019-06-24 DIAGNOSIS — Y92009 Unspecified place in unspecified non-institutional (private) residence as the place of occurrence of the external cause: Secondary | ICD-10-CM | POA: Insufficient documentation

## 2019-06-24 DIAGNOSIS — S61259A Open bite of unspecified finger without damage to nail, initial encounter: Secondary | ICD-10-CM

## 2019-06-24 DIAGNOSIS — Y939 Activity, unspecified: Secondary | ICD-10-CM | POA: Insufficient documentation

## 2019-06-24 DIAGNOSIS — Z91040 Latex allergy status: Secondary | ICD-10-CM | POA: Diagnosis not present

## 2019-06-24 DIAGNOSIS — S61251A Open bite of left index finger without damage to nail, initial encounter: Secondary | ICD-10-CM | POA: Diagnosis not present
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
## 2019-06-25 ENCOUNTER — Emergency Department: Payer: Medicaid Other

## 2019-06-25 MED ORDER — AMOXICILLIN-POT CLAVULANATE 875-125 MG PO TABS: 1.0000 | ORAL_TABLET | Freq: Two times a day (BID) | ORAL | 0 refills | Status: AC

## 2019-06-25 MED ORDER — BACITRACIN-NEOMYCIN-POLYMYXIN 400-5-5000 EX OINT
TOPICAL_OINTMENT | Freq: Once | CUTANEOUS | Status: AC
  Administered 2019-06-25: 1 via TOPICAL
  Filled 2019-06-25: qty 1

## 2019-06-25 MED ORDER — FLUCONAZOLE 150 MG PO TABS: 150.0000 mg | ORAL_TABLET | Freq: Once | ORAL | 1 refills | Status: AC

## 2019-06-25 MED ORDER — TETANUS-DIPHTH-ACELL PERTUSSIS 5-2.5-18.5 LF-MCG/0.5 IM SUSP
0.5000 mL | Freq: Once | INTRAMUSCULAR | Status: AC
  Administered 2019-06-25: 0.5 mL via INTRAMUSCULAR
  Filled 2019-06-25: qty 0.5

## 2019-06-25 MED ORDER — AMOXICILLIN-POT CLAVULANATE 875-125 MG PO TABS
1.0000 | ORAL_TABLET | Freq: Once | ORAL | Status: AC
  Administered 2019-06-25: 1 via ORAL
  Filled 2019-06-25: qty 1

## 2019-06-25 NOTE — ED Notes (Signed)
Dried blood cleaned from patient's finger with saline and gauze, pt tolerated well.
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
## 2019-06-25 NOTE — Discharge Instructions (Addendum)
Please seek medical attention for any high fevers, chest pain, shortness of breath, change in behavior, persistent vomiting, bloody stool or any other new or concerning symptoms.  

## 2020-03-14 ENCOUNTER — Emergency Department
Admission: EM | Admit: 2020-03-14 | Discharge: 2020-03-14 | Disposition: A | Discharge status: Home / Self Care | Payer: Medicaid Other | Attending: Emergency Medicine | Admitting: Emergency Medicine

## 2020-03-14 ENCOUNTER — Other Ambulatory Visit: Payer: Self-pay

## 2020-03-14 ENCOUNTER — Encounter: Payer: Self-pay | Admitting: Emergency Medicine

## 2020-03-14 DIAGNOSIS — M23200 Derangement of unspecified lateral meniscus due to old tear or injury, right knee: Secondary | ICD-10-CM | POA: Insufficient documentation

## 2020-03-14 DIAGNOSIS — Z91040 Latex allergy status: Secondary | ICD-10-CM | POA: Insufficient documentation

## 2020-03-14 DIAGNOSIS — M25561 Pain in right knee: Secondary | ICD-10-CM | POA: Diagnosis present

## 2020-03-14 DIAGNOSIS — M23300 Other meniscus derangements, unspecified lateral meniscus, right knee: Secondary | ICD-10-CM

## 2020-03-14 MED ORDER — MELOXICAM 15 MG PO TABS: 15.0000 mg | ORAL_TABLET | Freq: Every day | ORAL | 0 refills | Status: DC

## 2020-03-14 NOTE — ED Notes (Signed)
See triage note  Presents with pain to right lower leg  States pain started last Nov has become worse over the past 3 days    Denies any injury

## 2020-03-14 NOTE — ED Triage Notes (Signed)
Patient presents to the ED with right leg pain that has been intermittent since November, but worse in the past 3 days.  Patient states pain is between her knee and her ankle.  Patient states she has been on her feet a lot lately.
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
## 2020-12-24 ENCOUNTER — Encounter: Payer: Self-pay | Admitting: Emergency Medicine

## 2020-12-24 ENCOUNTER — Other Ambulatory Visit: Payer: Self-pay

## 2020-12-24 ENCOUNTER — Ambulatory Visit
Admission: EM | Admit: 2020-12-24 | Discharge: 2020-12-24 | Disposition: A | Discharge status: Home / Self Care | Payer: Medicaid Other | Attending: Family Medicine | Admitting: Family Medicine

## 2020-12-24 DIAGNOSIS — B373 Candidiasis of vulva and vagina: Secondary | ICD-10-CM | POA: Diagnosis present

## 2020-12-24 DIAGNOSIS — B9689 Other specified bacterial agents as the cause of diseases classified elsewhere: Secondary | ICD-10-CM | POA: Insufficient documentation

## 2020-12-24 DIAGNOSIS — N760 Acute vaginitis: Secondary | ICD-10-CM | POA: Diagnosis present

## 2020-12-24 DIAGNOSIS — B3731 Acute candidiasis of vulva and vagina: Secondary | ICD-10-CM

## 2020-12-24 LAB — WET PREP, GENITAL
Sperm: NONE SEEN
Trich, Wet Prep: NONE SEEN

## 2020-12-24 MED ORDER — METRONIDAZOLE 500 MG PO TABS: 500.0000 mg | ORAL_TABLET | Freq: Two times a day (BID) | ORAL | 0 refills | Status: DC

## 2020-12-24 MED ORDER — FLUCONAZOLE 150 MG PO TABS: 150.0000 mg | ORAL_TABLET | Freq: Once | ORAL | 0 refills | Status: AC

## 2020-12-24 NOTE — ED Triage Notes (Signed)
Pt c/o vaginal itching and slight vaginal discharge. Started about 2 days ago. No concern for stds.

## 2020-12-24 NOTE — ED Provider Notes (Addendum)
[facility]    CSN: [id number] Arrival date & time: 12/24/20  0810      History   Chief Complaint Chief Complaint  Patient presents with  . Vaginal Itching   HPI  40 year old female presents with vaginal itching.  Patient reports a 2-day history of vaginal itching. No significant vaginal discharge per her report.  Has had recent sexual intercourse.  No medications or interventions tried.  No known exacerbating or relieving factors.  Denies concerns for STDs.  No other complaints/concerns at this time.  Past Medical History:  Diagnosis Date  . Depression    pp with all deliveries no meds  . 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 Post term pregnancy at [redacted] weeks gestation 10/06/2016  . Supervision of other normal pregnancy, antepartum 10/20/2014    Past Surgical History:  Procedure Laterality Date  . DILATION AND EVACUATION  05/17/2007  . TOOTH EXTRACTION Bilateral 03/25/2017   Procedure: DENTAL RESTORATION/EXTRACTIONS X8;  Surgeon: Ocie Doyne, DDS;  Location: [facility];  Service: Oral Surgery;  Laterality: Bilateral;  . WISDOM TOOTH EXTRACTION      OB History    Gravida  11   Para  9   Term  9   Preterm      AB  2   Living  11     SAB  2   IAB      Ectopic      Multiple  2   Live Births  11            Home Medications    Prior to Admission medications   Medication Sig Start Date End Date Taking? Authorizing Provider  fluconazole (DIFLUCAN) 150 MG tablet Take 1 tablet (150 mg total) by mouth once for 1 dose. Repeat dose in 72 hours. 12/24/20 12/24/20 Yes Marty Uy G, DO  metroNIDAZOLE (FLAGYL) 500 MG tablet Take 1 tablet (500 mg total) by mouth 2 (two) times daily. 12/24/20  Yes Tommie Sams, DO    Family History Family History  Adopted: Yes    Social History Social History   Tobacco Use  . Smoking status: Never Smoker  . Smokeless tobacco: Never  Used  Vaping Use  . Vaping Use: Never used  Substance Use Topics  . Alcohol use: No  . Drug use: No     Allergies   Latex and Mushroom extract complex   Review of Systems Review of Systems  Constitutional: Negative.   Genitourinary:       Vaginal itching.   Physical Exam Triage Vital Signs ED Triage Vitals  Enc Vitals Group     BP 12/24/20 0833 (!) 143/96     Pulse Rate 12/24/20 0833 71     Resp 12/24/20 0833 18     Temp 12/24/20 0833 98.6 F (37 C)     Temp Source 12/24/20 0833 Oral     SpO2 12/24/20 0833 100 %     Weight 12/24/20 0831 279 lb 15.8 oz (127 kg)     Height 12/24/20 0831 5\' 4"  (1.626 m)     Head Circumference --      Peak Flow --      Pain Score 12/24/20 0831 0     Pain Loc --      Pain Edu? --      Excl. in GC? --    Updated Vital Signs BP (!) 143/96 (BP  Location: Left Arm)   Pulse 71   Temp 98.6 F (37 C) (Oral)   Resp 18   Ht 5\' 4"  (1.626 m)   Wt 127 kg   LMP 12/09/2020   SpO2 100%   BMI 48.06 kg/m   Visual Acuity Right Eye Distance:   Left Eye Distance:   Bilateral Distance:    Right Eye Near:   Left Eye Near:    Bilateral Near:     Physical Exam Vitals and nursing note reviewed.  Constitutional:      General: She is not in acute distress.    Appearance: Normal appearance. She is obese. She is not ill-appearing.  HENT:     Head: Normocephalic and atraumatic.  Eyes:     General:        Right eye: No discharge.        Left eye: No discharge.     Conjunctiva/sclera: Conjunctivae normal.  Cardiovascular:     Rate and Rhythm: Normal rate and regular rhythm.  Pulmonary:     Effort: Pulmonary effort is normal. No respiratory distress.     Breath sounds: No wheezing, rhonchi or rales.  Neurological:     Mental Status: She is alert.  Psychiatric:        Mood and Affect: Mood normal.        Behavior: Behavior normal.    UC Treatments / Results  Labs (all labs ordered are listed, but only abnormal results are  displayed) Labs Reviewed  WET PREP, GENITAL - Abnormal; Notable for the following components:      Result Value   Yeast Wet Prep HPF POC PRESENT (*)    Clue Cells Wet Prep HPF POC PRESENT (*)    WBC, Wet Prep HPF POC FEW (*)    All other components within normal limits    EKG   Radiology No results found.  Procedures Procedures (including critical care time)  Medications Ordered in UC Medications - No data to display  Initial Impression / Assessment and Plan / UC Course  I have reviewed the triage vital signs and the nursing notes.  Pertinent labs & imaging results that were available during my care of the patient were reviewed by me and considered in my medical decision making (see chart for details).    40 year old female presents with bacterial vaginosis and yeast vaginitis.  Treating with Diflucan and Flagyl.  Final Clinical Impressions(s) / UC Diagnoses   Final diagnoses:  Yeast vaginitis  BV (bacterial vaginosis)   Discharge Instructions   None    ED Prescriptions    Medication Sig Dispense Auth. Provider   fluconazole (DIFLUCAN) 150 MG tablet Take 1 tablet (150 mg total) by mouth once for 1 dose. Repeat dose in 72 hours. 2 tablet Jatavian Calica G, DO   metroNIDAZOLE (FLAGYL) 500 MG tablet Take 1 tablet (500 mg total) by mouth 2 (two) times daily. 14 tablet 10-02-1983, DO     PDMP not reviewed this encounter.    Tommie Sams [place], [place] 12/24/20 [phone]

## 2021-01-26 ENCOUNTER — Encounter: Payer: Self-pay | Admitting: Emergency Medicine

## 2021-01-26 ENCOUNTER — Other Ambulatory Visit: Payer: Self-pay

## 2021-01-26 ENCOUNTER — Ambulatory Visit
Admission: EM | Admit: 2021-01-26 | Discharge: 2021-01-26 | Disposition: A | Discharge status: Home / Self Care | Payer: Medicaid Other | Attending: Internal Medicine | Admitting: Internal Medicine

## 2021-01-26 DIAGNOSIS — N898 Other specified noninflammatory disorders of vagina: Secondary | ICD-10-CM

## 2021-01-26 MED ORDER — METRONIDAZOLE 500 MG PO TABS: 500.0000 mg | ORAL_TABLET | Freq: Two times a day (BID) | ORAL | 0 refills | Status: DC

## 2021-01-26 MED ORDER — FLUCONAZOLE 150 MG PO TABS: 150.0000 mg | ORAL_TABLET | Freq: Every day | ORAL | 0 refills | Status: DC

## 2021-01-26 NOTE — ED Triage Notes (Signed)
Pt c/o white vaginal discharge. She was seen on 12/24/20 for this and states she does not think it cleared up. Denies vaginal itching.

## 2021-01-26 NOTE — ED Provider Notes (Signed)
[facility]    CSN: [id number] Arrival date & time: 01/26/21  1329      History   Chief Complaint Chief Complaint  Patient presents with   Vaginal Discharge    HPI Sarah [name] is a 40 y.o. female who presents with white chunky discharge and itching. She was prescribed Diflucan and Flagyl on 5/24 when she was diagnosed with yeast and BV. States she feels the yeast infection is not gone.     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   teeth    Patient Active Problem List   Diagnosis Date Noted   Post term pregnancy at [redacted] weeks gestation 10/06/2016   Supervision of other normal pregnancy, antepartum 10/20/2014    Past Surgical History:  Procedure Laterality Date   DILATION AND EVACUATION  05/17/2007   TOOTH EXTRACTION Bilateral 03/25/2017   Procedure: DENTAL RESTORATION/EXTRACTIONS X8;  Surgeon: Ocie Doyne, DDS;  Location: [facility];  Service: Oral Surgery;  Laterality: Bilateral;   WISDOM TOOTH EXTRACTION      OB History     Gravida  11   Para  9   Term  9   Preterm      AB  2   Living  11      SAB  2   IAB      Ectopic      Multiple  2   Live Births  11            Home Medications    Prior to Admission medications   Medication Sig Start Date End Date Taking? Authorizing Provider  fluconazole (DIFLUCAN) 150 MG tablet Take 1 tablet (150 mg total) by mouth daily. 01/26/21  Yes Rodriguez-Southworth, Nettie Elm, PA-C    Family History Family History  Adopted: Yes    Social History Social History   Tobacco Use   Smoking status: Never   Smokeless tobacco: Never  Vaping Use   Vaping Use: Never used  Substance Use Topics   Alcohol use: No   Drug use: No     Allergies   Latex and Mushroom extract complex   Review of Systems Review of Systems + vaginal itching and discharge. Denies being diabetic.   Physical  Exam Triage Vital Signs ED Triage Vitals  Enc Vitals Group     BP 01/26/21 1343 130/74     Pulse Rate 01/26/21 1343 60     Resp 01/26/21 1343 18     Temp 01/26/21 1343 99.1 F (37.3 C)     Temp Source 01/26/21 1343 Oral     SpO2 01/26/21 1343 98 %     Weight 01/26/21 1341 279 lb 15.8 oz (127 kg)     Height 01/26/21 1341 5\' 4"  (1.626 m)     Head Circumference --      Peak Flow --      Pain Score 01/26/21 1340 0     Pain Loc --      Pain Edu? --      Excl. in GC? --    No data found.  Updated Vital Signs BP 130/74 (BP Location: Left Arm)   Pulse 60   Temp 99.1 F (37.3 C) (Oral)   Resp 18   Ht 5\' 4"  (1.626 m)   Wt 279 lb 15.8 oz (127 kg)   LMP 01/09/2021 (Approximate)   SpO2 98%   BMI  48.06 kg/m   Visual Acuity Right Eye Distance:   Left Eye Distance:   Bilateral Distance:    Right Eye Near:   Left Eye Near:    Bilateral Near:     Physical Exam Vitals and nursing note reviewed.  Constitutional:      General: She is not in acute distress.    Appearance: She is obese. She is not toxic-appearing.  HENT:     Right Ear: External ear normal.     Left Ear: External ear normal.  Eyes:     General: No scleral icterus.    Conjunctiva/sclera: Conjunctivae normal.  Pulmonary:     Effort: Pulmonary effort is normal.  Musculoskeletal:        General: Normal range of motion.     Cervical back: Neck supple.  Skin:    General: Skin is warm and dry.     Findings: No rash.  Neurological:     Mental Status: She is alert and oriented to person, place, and time.     Gait: Gait normal.  Psychiatric:        Mood and Affect: Mood normal.        Behavior: Behavior normal.        Thought Content: Thought content normal.        Judgment: Judgment normal.     UC Treatments / Results  Labs (all labs ordered are listed, but only abnormal results are displayed) Labs Reviewed - No data to display  EKG   Radiology No results found.  Procedures Procedures (including  critical care time)  Medications Ordered in UC Medications - No data to display  Initial Impression / Assessment and Plan / UC Course  I have reviewed the triage vital signs and the nursing notes. Has yeast vaginitis. I reviewed her prior visit and treatment and labs from 5/24 visit. I placed her on Diflucan as noted.      Final Clinical Impressions(s) / UC Diagnoses   Final diagnoses:  Vaginal itching   Discharge Instructions   None    ED Prescriptions     Medication Sig Dispense Auth. Provider   metroNIDAZOLE (FLAGYL) 500 MG tablet  (Status: Discontinued) Take 1 tablet (500 mg total) by mouth 2 (two) times daily. 14 tablet Rodriguez-Southworth, Cambrey Lupi, PA-C   fluconazole (DIFLUCAN) 150 MG tablet Take 1 tablet (150 mg total) by mouth daily. 2 tablet Rodriguez-Southworth, Nettie Elm, PA-C      PDMP not reviewed this encounter.   Garey Ham, PA-C 01/26/21 1401

## 2021-01-28 ENCOUNTER — Ambulatory Visit
Admission: EM | Admit: 2021-01-28 | Discharge: 2021-01-28 | Disposition: A | Discharge status: Home / Self Care | Payer: Medicaid Other | Attending: Sports Medicine | Admitting: Sports Medicine

## 2021-01-28 ENCOUNTER — Other Ambulatory Visit: Payer: Self-pay

## 2021-01-28 DIAGNOSIS — B9689 Other specified bacterial agents as the cause of diseases classified elsewhere: Secondary | ICD-10-CM | POA: Diagnosis not present

## 2021-01-28 DIAGNOSIS — N760 Acute vaginitis: Secondary | ICD-10-CM | POA: Diagnosis present

## 2021-01-28 LAB — WET PREP, GENITAL
Sperm: NONE SEEN
Trich, Wet Prep: NONE SEEN
Yeast Wet Prep HPF POC: NONE SEEN

## 2021-01-28 MED ORDER — CLINDAMYCIN HCL 300 MG PO CAPS: 300.0000 mg | ORAL_CAPSULE | Freq: Two times a day (BID) | ORAL | 0 refills | Status: AC

## 2021-01-28 NOTE — ED Provider Notes (Signed)
[facility]    CSN: [id number] Arrival date & time: 01/28/21  0805      History   Chief Complaint No chief complaint on file.   HPI Sarah [name] is a 40 y.o. female.   HPI  40 year old female here for evaluation of vaginal complaints.  Patient reports that she has been experiencing vaginal burning and itching with a thick white chunky discharge.  She was evaluated in this urgent care on 12/24/20 and was determined to have bacterial vaginosis and yeast infection.  She was treated with metronidazole and Diflucan at that time.  Patient returned on 01/26/21 with continued complaints of vaginal itching and was again treated for yeast vaginitis with Diflucan.  She reports that she does not feel that her symptoms improved with either the initial or secondary treatment.  She denies any fever, vaginal bleeding, painful urination, abdominal pain, nausea or vomiting, or back pain.  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   teeth    Patient Active Problem List   Diagnosis Date Noted   Post term pregnancy at [redacted] weeks gestation 10/06/2016   Supervision of other normal pregnancy, antepartum 10/20/2014    Past Surgical History:  Procedure Laterality Date   DILATION AND EVACUATION  05/17/2007   TOOTH EXTRACTION Bilateral 03/25/2017   Procedure: DENTAL RESTORATION/EXTRACTIONS X8;  Surgeon: Ocie Doyne, DDS;  Location: [facility];  Service: Oral Surgery;  Laterality: Bilateral;   WISDOM TOOTH EXTRACTION      OB History     Gravida  11   Para  9   Term  9   Preterm      AB  2   Living  11      SAB  2   IAB      Ectopic      Multiple  2   Live Births  11            Home Medications    Prior to Admission medications   Medication Sig Start Date End Date Taking? Authorizing Provider  clindamycin (CLEOCIN) 300 MG capsule Take 1 capsule (300 mg total) by  mouth 2 (two) times daily for 7 days. 01/28/21 02/04/21 Yes Becky Augusta, NP    Family History Family History  Adopted: Yes    Social History Social History   Tobacco Use   Smoking status: Never   Smokeless tobacco: Never  Vaping Use   Vaping Use: Never used  Substance Use Topics   Alcohol use: No   Drug use: No     Allergies   Latex and Mushroom extract complex   Review of Systems Review of Systems  Constitutional:  Negative for fever.  Gastrointestinal:  Negative for diarrhea, nausea and vomiting.  Genitourinary:  Positive for vaginal discharge. Negative for dysuria, frequency, hematuria, urgency and vaginal bleeding.  Musculoskeletal:  Negative for back pain.  Hematological: Negative.   Psychiatric/Behavioral: Negative.      Physical Exam Triage Vital Signs ED Triage Vitals  Enc Vitals Group     BP 01/28/21 0821 116/76     Pulse Rate 01/28/21 0821 61     Resp 01/28/21 0821 18     Temp 01/28/21 0821 99.1 F (37.3 C)     Temp Source 01/28/21 0821 Oral     SpO2 01/28/21 0821 100 %     Weight 01/28/21 0818 279 lb 15.8 oz (127  kg)     Height 01/28/21 0818 5\' 4"  (1.626 m)     Head Circumference --      Peak Flow --      Pain Score 01/28/21 0818 3     Pain Loc --      Pain Edu? --      Excl. in GC? --    No data found.  Updated Vital Signs BP 116/76 (BP Location: Left Arm)   Pulse 61   Temp 99.1 F (37.3 C) (Oral)   Resp 18   Ht 5\' 4"  (1.626 m)   Wt 279 lb 15.8 oz (127 kg)   LMP 01/09/2021 (Approximate)   SpO2 100%   BMI 48.06 kg/m   Visual Acuity Right Eye Distance:   Left Eye Distance:   Bilateral Distance:    Right Eye Near:   Left Eye Near:    Bilateral Near:     Physical Exam Vitals and nursing note reviewed.  Constitutional:      General: She is not in acute distress.    Appearance: Normal appearance. She is obese. She is not ill-appearing.  HENT:     Head: Normocephalic and atraumatic.  Cardiovascular:     Rate and Rhythm: Normal  rate and regular rhythm.     Pulses: Normal pulses.     Heart sounds: Normal heart sounds. No murmur heard.   No gallop.  Pulmonary:     Effort: Pulmonary effort is normal.     Breath sounds: Normal breath sounds. No wheezing, rhonchi or rales.  Abdominal:     Tenderness: There is no right CVA tenderness or left CVA tenderness.  Skin:    General: Skin is warm and dry.     Capillary Refill: Capillary refill takes less than 2 seconds.     Findings: No erythema or rash.  Neurological:     General: No focal deficit present.     Mental Status: She is alert and oriented to person, place, and time.  Psychiatric:        Mood and Affect: Mood normal.        Behavior: Behavior normal.        Thought Content: Thought content normal.        Judgment: Judgment normal.     UC Treatments / Results  Labs (all labs ordered are listed, but only abnormal results are displayed) Labs Reviewed  WET PREP, GENITAL - Abnormal; Notable for the following components:      Result Value   Clue Cells Wet Prep HPF POC PRESENT (*)    WBC, Wet Prep HPF POC FEW (*)    All other components within normal limits    EKG   Radiology No results found.  Procedures Procedures (including critical care time)  Medications Ordered in UC Medications - No data to display  Initial Impression / Assessment and Plan / UC Course  I have reviewed the triage vital signs and the nursing notes.  Pertinent labs & imaging results that were available during my care of the patient were reviewed by me and considered in my medical decision making (see chart for details).  Patient is a very pleasant and nontoxic-appearing 40 year old female here for evaluation of vaginal discharge and burning as outlined in the HPI above.  Patient's physical exam reveals a benign cardiopulmonary exam.  No CVA tenderness.  Abdomen is protuberant but soft and nontender.  Wet prep collected at triage reveals the presence of clue cells and white  blood  cells.  No yeast or trichomonas.  We will treat patient for BV with clindamycin as she was just on metronidazole and does not feel like it resolved her issue.  We will treat patient with 300 mg twice daily for 7 days.  I have also made a referral to [facility] here in [facility] for the patient to establish care and be evaluated for her recurrent BV as she will need to follow-up with GYN if her symptoms do not improve.   Final Clinical Impressions(s) / UC Diagnoses   Final diagnoses:  BV (bacterial vaginosis)     Discharge Instructions      Take the Clindamycin twice daily with food for 7 days.  Take an OTC probiotic 1 hour after each dose of antibiotic to help prevent diarrhea from the medication.  You can also use boric acid suppositories once or twice weekly to prevent recurrence of the BV.  Vaginal probiotic suppositories might also be helpful.  If your symptoms do not resolve you need to follow up with OB/GYN.     ED Prescriptions     Medication Sig Dispense Auth. Provider   clindamycin (CLEOCIN) 300 MG capsule Take 1 capsule (300 mg total) by mouth 2 (two) times daily for 7 days. 14 capsule Becky Augusta, NP      PDMP not reviewed this encounter.   Becky Augusta, NP 01/28/21 [phone]

## 2021-01-28 NOTE — Discharge Instructions (Addendum)
Take the Clindamycin twice daily with food for 7 days.  Take an OTC probiotic 1 hour after each dose of antibiotic to help prevent diarrhea from the medication.  You can also use boric acid suppositories once or twice weekly to prevent recurrence of the BV.  Vaginal probiotic suppositories might also be helpful.  If your symptoms do not resolve you need to follow up with OB/GYN.

## 2021-01-28 NOTE — ED Triage Notes (Signed)
Pt treated for yeast infection on Sunday but states "They couldn't test for anything so she just went by my symptoms." Started last night with burning sensation to vagina.

## 2021-01-29 ENCOUNTER — Other Ambulatory Visit (HOSPITAL_COMMUNITY)
Admission: RE | Admit: 2021-01-29 | Discharge: 2021-01-29 | Disposition: A | Discharge status: Home / Self Care | Payer: Medicaid Other | Source: Ambulatory Visit | Attending: Advanced Practice Midwife | Admitting: Advanced Practice Midwife

## 2021-01-29 ENCOUNTER — Ambulatory Visit (INDEPENDENT_AMBULATORY_CARE_PROVIDER_SITE_OTHER): Payer: Medicaid Other | Admitting: Advanced Practice Midwife

## 2021-01-29 ENCOUNTER — Encounter: Payer: Self-pay | Admitting: Advanced Practice Midwife

## 2021-01-29 VITALS — BP 117/74 | Ht 64.0 in | Wt 255.0 lb

## 2021-01-29 DIAGNOSIS — Z113 Encounter for screening for infections with a predominantly sexual mode of transmission: Secondary | ICD-10-CM | POA: Insufficient documentation

## 2021-01-29 DIAGNOSIS — N898 Other specified noninflammatory disorders of vagina: Secondary | ICD-10-CM

## 2021-01-29 DIAGNOSIS — Z7251 High risk heterosexual behavior: Secondary | ICD-10-CM | POA: Insufficient documentation

## 2021-01-29 DIAGNOSIS — Z1159 Encounter for screening for other viral diseases: Secondary | ICD-10-CM

## 2021-01-29 NOTE — Progress Notes (Signed)
Patient ID: Sarah [name], female   DOB: 02/19/1981, 40 y.o.   MRN: [id number]  Reason for Consult: Vaginal Irritation (No discharge or odor or itchiness x 3 weeks)   Referred by Becky Augusta, NP  Subjective:  HPI:  Sarah [name] is a 40 y.o. female being seen for symptoms of vaginitis that started about a month ago following intercourse with a new partner. Condom use has not been consistent. She was seen and treated for BV and yeast 1 month ago in the ED with some initial improvement. Most recently she was treated again for yeast without improvement and was subsequently referred to gyn for evaluation. Her symptoms at this time are vaginal irritation. She denies itching, discharge or odor. She does accept STD testing although that is not her main concern. She denies concern for pregnancy. We discussed vaginitis; causes, treatment, comfort measures. 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   teeth   Family History  Adopted: Yes   Past Surgical History:  Procedure Laterality Date   DILATION AND EVACUATION  05/17/2007   TOOTH EXTRACTION Bilateral 03/25/2017   Procedure: DENTAL RESTORATION/EXTRACTIONS X8;  Surgeon: Ocie Doyne, DDS;  Location: [facility];  Service: Oral Surgery;  Laterality: Bilateral;   WISDOM TOOTH EXTRACTION      Short Social History:  Social History   Tobacco Use   Smoking status: Never   Smokeless tobacco: Never  Substance Use Topics   Alcohol use: No    Allergies  Allergen Reactions   Latex Hives and Shortness Of Breath   Mushroom Extract Complex Hives, Shortness Of Breath and Swelling    Current Outpatient Medications  Medication Sig Dispense Refill   clindamycin (CLEOCIN) 300 MG capsule Take 1 capsule (300 mg total) by mouth 2 (two) times daily for 7 days. 14  capsule 0   No current facility-administered medications for this visit.    Review of Systems  Constitutional:  Positive for malaise/fatigue. Negative for chills and fever.       Positive for change in appetite  HENT:  Negative for congestion, ear discharge, ear pain, hearing loss, sinus pain and sore throat.   Eyes:  Negative for blurred vision and double vision.  Respiratory:  Negative for cough, shortness of breath and wheezing.   Cardiovascular:  Negative for chest pain, palpitations and leg swelling.  Gastrointestinal:  Positive for constipation. Negative for abdominal pain, blood in stool, diarrhea, heartburn, melena, nausea and vomiting.  Genitourinary:  Negative for dysuria, flank pain, frequency, hematuria and urgency.       Positive for vaginal irritation  Musculoskeletal:  Negative for back pain, joint pain and myalgias.  Skin:  Negative for itching and rash.  Neurological:  Positive for dizziness. Negative for tingling, tremors, sensory change, speech change, focal weakness, seizures, loss of consciousness, weakness and headaches.  Endo/Heme/Allergies:  Negative for environmental allergies. Does not bruise/bleed easily.  Psychiatric/Behavioral:  Negative for depression, hallucinations, memory loss, substance abuse and suicidal ideas. The patient is not nervous/anxious and does not have insomnia.   Breast: Positive for tenderness     Objective:  Objective   Vitals:   01/29/21 0859  BP: 117/74  Weight: 255 lb (115.7 kg)  Height: 5\' 4"  (1.626 m)   Body mass index is 43.77 kg/m. Constitutional: Well nourished, well  developed female in no acute distress.  HEENT: normal Skin: Warm and dry.  Cardiovascular: Regular rate and rhythm.   Extremity:  no edema   Respiratory: Clear to auscultation bilateral. Normal respiratory effort Neuro: DTRs 2+, Cranial nerves grossly intact Psych: Alert and Oriented x3. No memory deficits. Normal mood and affect.  MS: normal gait, normal  bilateral lower extremity ROM/strength/stability.  Pelvic exam:  is not limited by body habitus EGBUS: within normal limits Vagina: within normal limits and with normal mucosa, scant white discharge Cervix: not evaluated   Assessment/Plan:     40 y.o. G11 P 9, 0, 2, 11 with symptoms of vaginitis  Aptima swab; vaginitis/STDs Blood work; HIV, Hep B/C, RPR Follow up as needed when labs result Schedule annual exam for well woman/PAP smear   Tresea Mall CNM [facility] 01/29/2021, 11:04 AM

## 2021-01-29 NOTE — Patient Instructions (Signed)

## 2021-01-30 LAB — HEPATITIS B SURFACE ANTIBODY,QUALITATIVE: Hep B Surface Ab, Qual: NONREACTIVE

## 2021-01-30 LAB — CERVICOVAGINAL ANCILLARY ONLY
Bacterial Vaginitis (gardnerella): NEGATIVE
Candida Glabrata: NEGATIVE
Candida Vaginitis: NEGATIVE
Chlamydia: NEGATIVE
Comment: NEGATIVE
Comment: NEGATIVE
Comment: NEGATIVE
Comment: NEGATIVE
Comment: NEGATIVE
Comment: NORMAL
Neisseria Gonorrhea: NEGATIVE
Trichomonas: NEGATIVE

## 2021-01-30 LAB — HEPATITIS C ANTIBODY: Hep C Virus Ab: <0.1 s/co ratio (ref 0.0–0.9)

## 2021-01-30 LAB — RPR QUALITATIVE: RPR Ser Ql: NONREACTIVE

## 2021-01-30 LAB — HIV ANTIBODY (ROUTINE TESTING W REFLEX): HIV Screen 4th Generation wRfx: NONREACTIVE

## 2021-02-05 ENCOUNTER — Ambulatory Visit: Payer: Medicaid Other | Admitting: Advanced Practice Midwife

## 2021-02-20 ENCOUNTER — Other Ambulatory Visit (HOSPITAL_COMMUNITY)
Admission: RE | Admit: 2021-02-20 | Discharge: 2021-02-20 | Disposition: A | Discharge status: Home / Self Care | Payer: Medicaid Other | Source: Ambulatory Visit | Attending: Advanced Practice Midwife | Admitting: Advanced Practice Midwife

## 2021-02-20 ENCOUNTER — Ambulatory Visit (INDEPENDENT_AMBULATORY_CARE_PROVIDER_SITE_OTHER): Payer: Medicaid Other | Admitting: Advanced Practice Midwife

## 2021-02-20 ENCOUNTER — Other Ambulatory Visit: Payer: Self-pay

## 2021-02-20 ENCOUNTER — Encounter: Payer: Self-pay | Admitting: Advanced Practice Midwife

## 2021-02-20 VITALS — BP 121/80 | HR 63 | Ht 64.0 in | Wt 244.0 lb

## 2021-02-20 DIAGNOSIS — Z01419 Encounter for gynecological examination (general) (routine) without abnormal findings: Secondary | ICD-10-CM | POA: Insufficient documentation

## 2021-02-20 DIAGNOSIS — N939 Abnormal uterine and vaginal bleeding, unspecified: Secondary | ICD-10-CM | POA: Diagnosis not present

## 2021-02-20 DIAGNOSIS — Z124 Encounter for screening for malignant neoplasm of cervix: Secondary | ICD-10-CM | POA: Insufficient documentation

## 2021-02-20 LAB — POCT URINE PREGNANCY: Preg Test, Ur: NEGATIVE

## 2021-02-22 NOTE — Progress Notes (Signed)
Gynecology Annual Exam   Date of Service: 02/20/2021  PCP: Brock Bad, MD  Chief Complaint:  Chief Complaint  Patient presents with   Annual Exam    History of Present Illness: Patient is a 40 y.o. [id number] presents for annual exam. The patient has no gyn complaints today. She mentions a shorter than usual period this month and requests a pregnancy test.  LMP: Patient's last menstrual period was 02/04/2021 (exact date). Average Interval: regular, 28 days Duration of flow: 7 days Heavy Menses: no Clots: no Intermenstrual Bleeding: no Postcoital Bleeding: no Dysmenorrhea: no  The patient is sexually active. She currently uses condoms for contraception. She denies dyspareunia.  The patient does perform self breast exams.  There is no notable family history of breast or ovarian cancer in her family.  The patient wears seatbelts: yes.   The patient has regular exercise: she walks 4 miles daily, she admits a decreased appetite in the past 2 weeks and otherwise has healthy diet and adequate hydration. She admits only 3 hours sleep per night.  The patient denies current symptoms of depression.    Review of Systems: Review of Systems  Constitutional:  Positive for malaise/fatigue. Negative for chills and fever.  HENT:  Negative for congestion, ear discharge, ear pain, hearing loss, sinus pain and sore throat.   Eyes:  Negative for blurred vision and double vision.  Respiratory:  Negative for cough, shortness of breath and wheezing.   Cardiovascular:  Negative for chest pain, palpitations and leg swelling.  Gastrointestinal:  Negative for abdominal pain, blood in stool, constipation, diarrhea, heartburn, melena, nausea and vomiting.  Genitourinary:  Negative for dysuria, flank pain, frequency, hematuria and urgency.  Musculoskeletal:  Negative for back pain, joint pain and myalgias.  Skin:  Negative for itching and rash.  Neurological:  Negative for dizziness, tingling,  tremors, sensory change, speech change, focal weakness, seizures, loss of consciousness, weakness and headaches.  Endo/Heme/Allergies:  Negative for environmental allergies. Does not bruise/bleed easily.  Psychiatric/Behavioral:  Negative for depression, hallucinations, memory loss, substance abuse and suicidal ideas. The patient is not nervous/anxious and does not have insomnia.    Past Medical History:  There are no problems to display for this patient.   Past Surgical History:  Past Surgical History:  Procedure Laterality Date   DILATION AND EVACUATION  05/17/2007   TOOTH EXTRACTION Bilateral 03/25/2017   Procedure: DENTAL RESTORATION/EXTRACTIONS X8;  Surgeon: Ocie Doyne, DDS;  [facility];  Service: Oral Surgery;  Laterality: Bilateral;   WISDOM TOOTH EXTRACTION      Gynecologic History:  Patient's last menstrual period was 02/04/2021 (exact date). Contraception: condoms Last Pap: 5 years ago Results were: no abnormalities   Obstetric History: [id number]  Family History:  Family History  Adopted: Yes    Social History:  Social History   Socioeconomic History   Marital status: Single    Spouse name: Not on file   Number of children: Not on file   Years of education: Not on file   Highest education level: Not on file  Occupational History   Not on file  Tobacco Use   Smoking status: Never   Smokeless tobacco: Never  Vaping Use   Vaping Use: Never used  Substance and Sexual Activity   Alcohol use: No   Drug use: No   Sexual activity: Yes    Birth control/protection: None  Other Topics Concern   Not on file  Social History Narrative  Not on file   Social Determinants of Health   Financial Resource Strain: Not on file  Food Insecurity: Not on file  Transportation Needs: Not on file  Physical Activity: Not on file  Stress: Not on file  Social Connections: Not on file  Intimate Partner Violence: Not on file    Allergies:   Allergies  Allergen Reactions   Latex Hives and Shortness Of Breath   Mushroom Extract Complex Hives, Shortness Of Breath and Swelling    Medications: Prior to Admission medications   Not on File    Physical Exam Vitals: Blood Pressure 121/80, pulse 63, height 5\' 4"  (1.626 m), weight 244 lb (110.7 kg), last menstrual period 02/04/2021.  General: NAD HEENT: normocephalic, anicteric Thyroid: no enlargement, no palpable nodules Pulmonary: No increased work of breathing, CTAB Cardiovascular: RRR, distal pulses 2+ Breast: Breast symmetrical, no tenderness, no palpable nodules or masses, no skin or nipple retraction present, no nipple discharge.  No axillary or supraclavicular lymphadenopathy. Abdomen: NABS, soft, non-tender, non-distended.  Umbilicus without lesions.  No hepatomegaly, splenomegaly or masses palpable. No evidence of hernia  Genitourinary:  External: Normal external female genitalia.  Normal urethral meatus, normal Bartholin's and Skene's glands.    Vagina: Normal vaginal mucosa, no evidence of prolapse.    Cervix: Grossly normal in appearance, no bleeding, no CMT  Uterus: Non-enlarged, mobile, normal contour.    Adnexa: ovaries non-enlarged, no adnexal masses  Rectal: deferred  Lymphatic: no evidence of inguinal lymphadenopathy Extremities: no edema, erythema, or tenderness Neurologic: Grossly intact Psychiatric: mood appropriate, affect full   Assessment: 40 y.o. 20 routine annual exam  Plan: Problem List Items Addressed This Visit   None Visit Diagnoses     Well woman exam with routine gynecological exam    -  Primary   Relevant Orders   Cytology - PAP   Abnormal uterine bleeding       Relevant Orders   POCT urine pregnancy (Completed)   Screening for cervical cancer       Relevant Orders   Cytology - PAP       1) STI screening  was completed in the last month and was normal  2)  ASCCP guidelines and rationale discussed.  Patient opts for  every 5 years screening interval  3) Contraception - the patient is currently using  condoms.  She is happy with her current form of contraception and plans to continue  4) Routine healthcare maintenance including cholesterol, diabetes screening discussed Declines  5) Return in about 1 year (around 02/20/2022) for annual established gyn.   02/22/2022, CNM [facility] 02/22/2021, 3:03 PM

## 2021-02-24 LAB — CYTOLOGY - PAP
Comment: NEGATIVE
High risk HPV: NEGATIVE

## 2021-02-27 ENCOUNTER — Other Ambulatory Visit: Payer: Self-pay

## 2021-02-27 ENCOUNTER — Ambulatory Visit
Admission: EM | Admit: 2021-02-27 | Discharge: 2021-02-27 | Disposition: A | Discharge status: Home / Self Care | Payer: Medicaid Other | Attending: Sports Medicine | Admitting: Sports Medicine

## 2021-02-27 DIAGNOSIS — N760 Acute vaginitis: Secondary | ICD-10-CM | POA: Diagnosis present

## 2021-02-27 DIAGNOSIS — B9689 Other specified bacterial agents as the cause of diseases classified elsewhere: Secondary | ICD-10-CM | POA: Insufficient documentation

## 2021-02-27 LAB — WET PREP, GENITAL
Sperm: NONE SEEN
Trich, Wet Prep: NONE SEEN
Yeast Wet Prep HPF POC: NONE SEEN

## 2021-02-27 LAB — CHLAMYDIA/NGC RT PCR (ARMC ONLY)
Chlamydia Tr: NOT DETECTED
N gonorrhoeae: NOT DETECTED

## 2021-02-27 MED ORDER — FLUCONAZOLE 150 MG PO TABS: 150.0000 mg | ORAL_TABLET | Freq: Every day | ORAL | 0 refills | Status: DC

## 2021-02-27 MED ORDER — METRONIDAZOLE 500 MG PO TABS: 500.0000 mg | ORAL_TABLET | Freq: Two times a day (BID) | ORAL | 0 refills | Status: DC

## 2021-02-27 NOTE — Discharge Instructions (Addendum)
We will call you if your STD test is positive, but you may also check your Mychart for results.

## 2021-02-27 NOTE — ED Provider Notes (Signed)
[facility]    CSN: [id number] Arrival date & time: 02/27/21  1427      History   Chief Complaint Chief Complaint  Patient presents with   Vaginal Odor    HPI Sarah [name] is a 40 y.o. female who presents with onset of fishy vaginal odor since this am. Has sex with a her long tern partner 2 days ago and started after that. She has had BV before, but never smelled this bad. She denies use of tampons. LMP 7/5. She Also wants STD testing.     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   teeth    There are no problems to display for this patient.   Past Surgical History:  Procedure Laterality Date   DILATION AND EVACUATION  05/17/2007   TOOTH EXTRACTION Bilateral 03/25/2017   Procedure: DENTAL RESTORATION/EXTRACTIONS X8;  Surgeon: Ocie Doyne, DDS;  Location: [facility];  Service: Oral Surgery;  Laterality: Bilateral;   WISDOM TOOTH EXTRACTION      OB History     Gravida  11   Para  9   Term  9   Preterm      AB  2   Living  11      SAB  2   IAB      Ectopic      Multiple  2   Live Births  11            Home Medications    Prior to Admission medications   Not on File    Family History Family History  Adopted: Yes    Social History Social History   Tobacco Use   Smoking status: Never   Smokeless tobacco: Never  Vaping Use   Vaping Use: Never used  Substance Use Topics   Alcohol use: No   Drug use: No     Allergies   Latex and Mushroom extract complex   Review of Systems Review of Systems  Constitutional:  Negative for fever.  Genitourinary:  Positive for vaginal discharge. Negative for genital sores, menstrual problem and pelvic pain.  Skin:  Negative for rash.    Physical Exam Triage Vital Signs ED Triage Vitals  Enc Vitals Group     BP 02/27/21 1509 133/81     Pulse Rate 02/27/21 1509 80     Resp 02/27/21  1509 18     Temp 02/27/21 1509 99.2 F (37.3 C)     Temp Source 02/27/21 1509 Oral     SpO2 02/27/21 1509 98 %     Weight 02/27/21 1507 255 lb (115.7 kg)     Height 02/27/21 1507 5\' 4"  (1.626 m)     Head Circumference --      Peak Flow --      Pain Score 02/27/21 1506 0     Pain Loc --      Pain Edu? --      Excl. in GC? --    No data found.  Updated Vital Signs BP 133/81 (BP Location: Right Arm)   Pulse 80   Temp 99.2 F (37.3 C) (Oral)   Resp 18   Ht 5\' 4"  (1.626 m)   Wt 255 lb (115.7 kg)   LMP 02/04/2021 (Exact Date)   SpO2 98%   BMI 43.77 kg/m   Visual Acuity Right Eye Distance:   Left Eye Distance:  Bilateral Distance:    Right Eye Near:   Left Eye Near:    Bilateral Near:     Physical Exam Constitutional:      General: She is not in acute distress.    Appearance: She is obese. She is not toxic-appearing.  HENT:     Head: Normocephalic.     Right Ear: External ear normal.     Left Ear: External ear normal.  Eyes:     Conjunctiva/sclera: Conjunctivae normal.  Pulmonary:     Effort: Pulmonary effort is normal.  Musculoskeletal:        General: Normal range of motion.     Cervical back: Neck supple.  Neurological:     Mental Status: She is alert and oriented to person, place, and time.     Gait: Gait normal.  Psychiatric:        Mood and Affect: Mood normal.        Behavior: Behavior normal.        Thought Content: Thought content normal.        Judgment: Judgment normal.     UC Treatments / Results  Labs (all labs ordered are listed, but only abnormal results are displayed) Labs Reviewed  WET PREP, GENITAL  CHLAMYDIA/NGC RT PCR [facility] ONLY)              EKG   Radiology No results found.  Procedures Procedures (including critical care time)  Medications Ordered in UC Medications - No data to display  Initial Impression / Assessment and Plan / UC Course  I have reviewed the triage vital signs and the nursing notes. Pertinent labs  results that were available during my care of the patient were reviewed by me and considered in my medical decision making (see chart for details). Has BV.  STD test are pending and we will inform her of results when they are back.  I placed her on Flagyl and diflucan since she is prone to yeast infections with antibiotics as noted.    Final Clinical Impressions(s) / UC Diagnoses   Final diagnoses:  None   Discharge Instructions   None    ED Prescriptions   None    PDMP not reviewed this encounter.   Garey Ham, Cordelia Poche 02/27/21 1536

## 2021-02-27 NOTE — ED Triage Notes (Signed)
Patient states that she has been noticing a vaginal odor of "fish" since today. States that she recently had intercourse on Tuesday and is unsure if they are related. States that she has been spraying deodorizers down there as well.

## 2021-03-26 ENCOUNTER — Encounter: Payer: Self-pay | Admitting: Obstetrics & Gynecology

## 2021-03-26 ENCOUNTER — Ambulatory Visit (INDEPENDENT_AMBULATORY_CARE_PROVIDER_SITE_OTHER): Payer: Medicaid Other | Admitting: Obstetrics & Gynecology

## 2021-03-26 ENCOUNTER — Other Ambulatory Visit (HOSPITAL_COMMUNITY)
Admission: RE | Admit: 2021-03-26 | Discharge: 2021-03-26 | Disposition: A | Discharge status: Home / Self Care | Payer: Medicaid Other | Source: Ambulatory Visit | Attending: Obstetrics & Gynecology | Admitting: Obstetrics & Gynecology

## 2021-03-26 ENCOUNTER — Other Ambulatory Visit: Payer: Self-pay

## 2021-03-26 VITALS — BP 130/80 | Ht 64.0 in | Wt 257.0 lb

## 2021-03-26 DIAGNOSIS — R87619 Unspecified abnormal cytological findings in specimens from cervix uteri: Secondary | ICD-10-CM | POA: Diagnosis not present

## 2021-03-26 NOTE — Patient Instructions (Signed)
[email or web address]  Colposcopy, Care After This sheet gives you information about how to care for yourself after your procedure. Your health care provider may also give you more specific instructions. If you have problems or questions, contact your health careprovider. What can I expect after the procedure? If you had a colposcopy without a biopsy, you can expect to feel fine right away after your procedure. However, you may have some spotting of blood for afew days. You can return to your normal activities. If you had a colposcopy with a biopsy, it is common after the procedure to have: Soreness and mild pain. These may last for a few days. Light-headedness. Mild vaginal bleeding or discharge that is dark-colored and grainy. This may last for a few days. The discharge may be caused by a liquid (solution) that was used during the procedure. You may need to wear a sanitary pad during this time. Spotting of blood for at least 48 hours after the procedure. Follow these instructions at home: Medicines Take over-the-counter and prescription medicines only as told by your health care provider. Talk with your health care provider about what type of over-the-counter pain medicine and prescription medicine you can start to take again. It is especially important to talk with your health care provider if you take blood thinners. Activity Limit your physical activity for the first day after your procedure as told by your health care provider. Avoid using douche products, using tampons, or having sex for at least 3 days after the procedure or for as long as told. Return to your normal activities as told by your health care provider. Ask your health care provider what activities are safe for you. General instructions  Drink enough fluid to keep your urine pale yellow. Ask your health care provider if you may take baths, swim, or use a hot tub. You may take showers. If you use  birth control (contraception), continue to use it. Keep all follow-up visits as told by your health care provider. This is important.  Contact a health care provider if: You develop a skin rash. Get help right away if: You bleed a lot from your vagina or pass blood clots. This includes using more than one sanitary pad each hour for 2 hours in a row. You have a fever or chills. You have vaginal discharge that is abnormal, is yellow in color, or smells bad. This could be a sign of infection. You have severe pain or cramps in your lower abdomen that do not go away with medicine. You faint. Summary If you had a colposcopy without a biopsy, you can expect to feel fine right away, but you may have some spotting of blood for a few days. You can return to your normal activities. If you had a colposcopy with a biopsy, it is common to have mild pain for a few days and spotting for 48 hours after the procedure. Avoid using douche products, using tampons, and having sex for at least 3 days after the procedure or for as long as told by your health care provider. Get help right away if you have heavy bleeding, severe pain, or signs of infection. This information is not intended to replace advice given to you by your health care provider. Make sure you discuss any questions you have with your healthcare provider. Document Revised: 07/19/2019 Document Reviewed: 07/19/2019 Elsevier Patient Education  2022 Elsevier Inc.  

## 2021-03-26 NOTE — Progress Notes (Signed)
HPI:  Sarah [name] is a 40 y.o.  [id number]  who presents today for evaluation and management of abnormal cervical cytology.    Dysplasia History:  AGC, NOS  ROS:  Pertinent items are noted in HPI.  OB History  Gravida Para Term Preterm AB Living  11 9 9   2 11   SAB IAB Ectopic Multiple Live Births  2     2 11     # Outcome Date GA Lbr Len/2nd Weight Sex Delivery Anes PTL Lv  11 Term 10/06/16 [redacted]w[redacted]d 07:28 / 00:18 7 lb 8.3 oz (3.41 kg) M Vag-Spont EPI  LIV     Birth Comments: WNL  10 Term 10/20/14 [redacted]w[redacted]d 07:47 / 00:38 7 lb 1.8 oz (3.226 kg) F Vag-Vacuum EPI  LIV  9 SAB 2015          8 Term 05/14/12 [redacted]w[redacted]d 08:51 / 00:24 5 lb 7.5 oz (2.48 kg) F Vag-Spont EPI  LIV  7A Term 03/2010 [redacted]w[redacted]d  4 lb 6 oz (1.984 kg) F Vag-Spont   LIV  7B Term 03/2010 [redacted]w[redacted]d  5 lb 2 oz (2.325 kg) F Vag-Spont   LIV  6A Term 01/2009 [redacted]w[redacted]d  5 lb 6 oz (2.438 kg) M Vag-Spont   LIV  6B Term 01/2009 [redacted]w[redacted]d  4 lb 6 oz (1.984 kg) M Vag-Spont   LIV  5 SAB 05/2007 [redacted]w[redacted]d         4 Term 08/2006 [redacted]w[redacted]d  8 lb 4 oz (3.742 kg) M Vag-Spont   LIV  3 Term 12/2004 [redacted]w[redacted]d  6 lb 4 oz (2.835 kg) M Vag-Spont   LIV  2 Term 10/2002 [redacted]w[redacted]d  5 lb 6 oz (2.438 kg) F Vag-Spont   LIV  1 Term 2002 [redacted]w[redacted]d  5 lb 6 oz (2.438 kg) M Vag-Spont   LIV    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   teeth    Past Surgical History:  Procedure Laterality Date   DILATION AND EVACUATION  05/17/2007   TOOTH EXTRACTION Bilateral 03/25/2017   Procedure: DENTAL RESTORATION/EXTRACTIONS X8;  Surgeon: 05/19/2007, DDS;  Location: [facility];  Service: Oral Surgery;  Laterality: Bilateral;   WISDOM TOOTH EXTRACTION      SOCIAL HISTORY:  Social History   Substance and Sexual Activity  Alcohol Use No    Social History   Substance and Sexual Activity  Drug Use No     Family History  Adopted: Yes    ALLERGIES:  Latex and Mushroom extract complex  Current  Outpatient Medications on File Prior to Visit  Medication Sig Dispense Refill   fluconazole (DIFLUCAN) 150 MG tablet Take 1 tablet (150 mg total) by mouth daily. (Patient not taking: Reported on 03/26/2021) 1 tablet 0   metroNIDAZOLE (FLAGYL) 500 MG tablet Take 1 tablet (500 mg total) by mouth 2 (two) times daily. (Patient not taking: Reported on 03/26/2021) 14 tablet 0   No current facility-administered medications on file prior to visit.    Physical Exam: -Vitals:  BP 130/80   Ht 5\' 4"  (1.626 m)   Wt 257 lb (116.6 kg)   LMP 03/04/2021   BMI 44.11 kg/m  GEN: WD, WN, NAD.  A+ O x 3, good mood and affect. ABD:  NT, ND.  Soft, no masses.  No hernias noted.   Pelvic:   Vulva: Normal appearance.  No lesions.  Vagina: No lesions or  abnormalities noted.  Support: Normal pelvic support.  Urethra No masses tenderness or scarring.  Meatus Normal size without lesions or prolapse.  Cervix: See below.  Anus: Normal exam.  No lesions.  Perineum: Normal exam.  No lesions.        Bimanual   Uterus: Normal size.  Non-tender.  Mobile.  AV.  Adnexae: No masses.  Non-tender to palpation.  Cul-de-sac: Negative for abnormality.   PROCEDURE: 1.  Urine Pregnancy Test:  not done 2.  Colposcopy performed with 4% acetic acid after verbal consent obtained                                         -Aceto-white and vascular Lesions Location(s): 12 o'clock.              -Biopsy performed at 12, 6 o'clock               -ECC indicated and performed: Yes.       -Biopsy sites made hemostatic with pressure, AgNO3, and/or Monsel's solution   -Satisfactory colposcopy: Yes.      -Evidence of Invasive cervical CA :  NO  ASSESSMENT:  Sarah [name] is a 40 y.o. [id number] here for  1. Atypical glandular cells on cervical Pap smear   .  PLAN: 1.  I discussed the grading system of pap smears and HPV high risk viral types.  We will discuss and base management after colpo results return. 2. Follow up PAP 6  months, vs intervention if high grade dysplasia identified  3. Endometrial Biopsy After discussion with the patient regarding her abnormal uterine bleeding I recommended that she proceed with an endometrial biopsy for further diagnosis. The risks, benefits, alternatives, and indications for an endometrial biopsy were discussed with the patient in detail. She understood the risks including infection, bleeding, cervical laceration and uterine perforation.  Verbal consent was obtained.   PROCEDURE NOTE:  Pipelle endometrial biopsy was performed using aseptic technique with iodine preparation.  The uterus was sounded to a length of 8 cm.  Adequate sampling was obtained with minimal blood loss.  The patient tolerated the procedure well.  Disposition will be pending pathology.  Annamarie Major, MD, [facility], [facility] 03/26/2021  3:52 PM

## 2021-04-02 LAB — SURGICAL PATHOLOGY

## 2021-05-02 ENCOUNTER — Other Ambulatory Visit: Payer: Self-pay

## 2021-05-02 ENCOUNTER — Ambulatory Visit
Admission: EM | Admit: 2021-05-02 | Discharge: 2021-05-02 | Disposition: A | Discharge status: Home / Self Care | Payer: Medicaid Other | Attending: Physician Assistant | Admitting: Physician Assistant

## 2021-05-02 ENCOUNTER — Encounter: Payer: Self-pay | Admitting: Emergency Medicine

## 2021-05-02 DIAGNOSIS — N898 Other specified noninflammatory disorders of vagina: Secondary | ICD-10-CM | POA: Diagnosis not present

## 2021-05-02 DIAGNOSIS — N760 Acute vaginitis: Secondary | ICD-10-CM | POA: Diagnosis not present

## 2021-05-02 LAB — URINALYSIS, COMPLETE (UACMP) WITH MICROSCOPIC
Bilirubin Urine: NEGATIVE
Glucose, UA: NEGATIVE mg/dL
Hgb urine dipstick: NEGATIVE
Ketones, ur: NEGATIVE mg/dL
Leukocytes,Ua: NEGATIVE
Nitrite: NEGATIVE
Protein, ur: NEGATIVE mg/dL
Specific Gravity, Urine: 1.025 (ref 1.005–1.030)
pH: 6.5 (ref 5.0–8.0)

## 2021-05-02 LAB — CHLAMYDIA/NGC RT PCR (ARMC ONLY)
Chlamydia Tr: NOT DETECTED
N gonorrhoeae: NOT DETECTED

## 2021-05-02 LAB — WET PREP, GENITAL
Sperm: NONE SEEN
Trich, Wet Prep: NONE SEEN

## 2021-05-02 MED ORDER — FLUCONAZOLE 150 MG PO TABS: ORAL_TABLET | ORAL | 0 refills | Status: DC

## 2021-05-02 MED ORDER — METRONIDAZOLE 500 MG PO TABS: 500.0000 mg | ORAL_TABLET | Freq: Two times a day (BID) | ORAL | 0 refills | Status: AC

## 2021-05-02 NOTE — ED Provider Notes (Signed)
[facility]    CSN: [id number] Arrival date & time: 05/02/21  1513      History   Chief Complaint Chief Complaint  Patient presents with   Vaginal Discharge    HPI Sarah [name] is a 40 y.o. female presenting for onset of vaginal discharge, itching and irritation yesterday.  She says the vaginal discharge does not have an odor.  She denies any fever, abdominal/pelvic pain or back pain.  No dysuria, frequency or urgency.  Patient also would like to be tested for STIs.  She is sexually active with one partner but does admit that they do not use protection and she does not use any birth control.  She denies possibility of pregnancy though.  Last menstrual period was on 04/03/2021.  Patient reports recurrent BV and yeast infections.  No other complaints.  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    There are no problems to display for this patient.   Past Surgical History:  Procedure Laterality Date   DILATION AND EVACUATION  05/17/2007   TOOTH EXTRACTION Bilateral 03/25/2017   Procedure: DENTAL RESTORATION/EXTRACTIONS X8;  Surgeon: Ocie Doyne, DDS;  Location: [facility];  Service: Oral Surgery;  Laterality: Bilateral;   WISDOM TOOTH EXTRACTION      OB History     Gravida  11   Para  9   Term  9   Preterm      AB  2   Living  11      SAB  2   IAB      Ectopic      Multiple  2   Live Births  11            Home Medications    Prior to Admission medications   Medication Sig Start Date End Date Taking? Authorizing Provider  fluconazole (DIFLUCAN) 150 MG tablet Take 1 tab PO q72 hr prn yeast infection 05/02/21   Eusebio Friendly B, PA-C  metroNIDAZOLE (FLAGYL) 500 MG tablet Take 1 tablet (500 mg total) by mouth 2 (two) times daily for 7 days. 05/02/21 05/09/21  Shirlee Latch, PA-C    Family History Family History  Adopted: Yes     Social History Social History   Tobacco Use   Smoking status: Never   Smokeless tobacco: Never  Vaping Use   Vaping Use: Never used  Substance Use Topics   Alcohol use: No   Drug use: No     Allergies   Latex and Mushroom extract complex   Review of Systems Review of Systems  Constitutional:  Negative for fatigue and fever.  Gastrointestinal:  Negative for abdominal pain, nausea and vomiting.  Genitourinary:  Positive for vaginal discharge. Negative for dysuria, flank pain, frequency, hematuria, urgency, vaginal bleeding and vaginal pain.  Musculoskeletal:  Negative for back pain.  Skin:  Negative for rash.    Physical Exam Triage Vital Signs ED Triage Vitals  Enc Vitals Group     BP 05/02/21 1602 (!) 144/89     Pulse Rate 05/02/21 1602 77     Resp 05/02/21 1602 14     Temp 05/02/21 1602 98.6 F (37 C)     Temp Source 05/02/21 1602 Oral     SpO2 05/02/21 1602 100 %     Weight 05/02/21 1559 255 lb (115.7 kg)  Height 05/02/21 1559 5\' 4"  (1.626 m)     Head Circumference --      Peak Flow --      Pain Score 05/02/21 1558 2     Pain Loc --      Pain Edu? --      Excl. in GC? --    No data found.  Updated Vital Signs BP (!) 144/89 (BP Location: Right Arm)   Pulse 77   Temp 98.6 F (37 C) (Oral)   Resp 14   Ht 5\' 4"  (1.626 m)   Wt 255 lb (115.7 kg)   LMP 04/03/2021 (Approximate)   SpO2 100%   BMI 43.77 kg/m      Physical Exam Vitals and nursing note reviewed.  Constitutional:      General: She is not in acute distress.    Appearance: Normal appearance. She is not ill-appearing or toxic-appearing.  HENT:     Head: Normocephalic and atraumatic.  Eyes:     General: No scleral icterus.       Right eye: No discharge.        Left eye: No discharge.     Conjunctiva/sclera: Conjunctivae normal.  Cardiovascular:     Rate and Rhythm: Normal rate and regular rhythm.     Heart sounds: Normal heart sounds.  Pulmonary:     Effort: Pulmonary effort is  normal. No respiratory distress.     Breath sounds: Normal breath sounds.  Abdominal:     Palpations: Abdomen is soft.     Tenderness: There is no abdominal tenderness. There is no right CVA tenderness or left CVA tenderness.  Musculoskeletal:     Cervical back: Neck supple.  Skin:    General: Skin is dry.  Neurological:     General: No focal deficit present.     Mental Status: She is alert. Mental status is at baseline.     Motor: No weakness.     Gait: Gait normal.  Psychiatric:        Mood and Affect: Mood normal.        Behavior: Behavior normal.        Thought Content: Thought content normal.     UC Treatments / Results  Labs (all labs ordered are listed, but only abnormal results are displayed) Labs Reviewed  WET PREP, GENITAL - Abnormal; Notable for the following components:      Result Value   Yeast Wet Prep HPF POC PRESENT (*)    Clue Cells Wet Prep HPF POC PRESENT (*)    WBC, Wet Prep HPF POC FEW (*)    All other components within normal limits  URINALYSIS, COMPLETE (UACMP) WITH MICROSCOPIC - Abnormal; Notable for the following components:   Bacteria, UA RARE (*)    All other components within normal limits  CHLAMYDIA/NGC RT PCR [facility] ONLY)              EKG   Radiology No results found.  Procedures Procedures (including critical care time)  Medications Ordered in UC Medications - No data to display  Initial Impression / Assessment and Plan / UC Course  I have reviewed the triage vital signs and the nursing notes.  Pertinent labs & imaging results that were available during my care of the patient were reviewed by me and considered in my medical decision making (see chart for details).  40 year old female presenting for onset of vaginal discharge, itching and irritation yesterday.  Patient elects to perform self swab today.  Wet prep performed today shows positive clue cells and yeast Urinalysis is within normal limits. Vaginal swab sent for  GC/chlamydia.  Based on patient's lab results, treating her for a yeast infection with Diflucan and BV infection with metronidazole.  Advised to follow-up as needed.  Use of birth control and protection advised.  Final Clinical Impressions(s) / UC Diagnoses   Final diagnoses:  Acute vaginitis  Vaginal discharge     Discharge Instructions      -You have a yeast infection and a bacterial vaginosis infection.  I have sent antibiotics and antifungals to pharmacy. -Practice good hygiene. -The results of your gonorrhea and Chlamydia test will be back later today or early tomorrow.  We will contact you if any results are positive and recommend treatment. -Use protection with your partner to try to prevent STIs.  Use birth control if not trying to get pregnant.     ED Prescriptions     Medication Sig Dispense Auth. Provider   fluconazole (DIFLUCAN) 150 MG tablet Take 1 tab PO q72 hr prn yeast infection 2 tablet Eusebio Friendly B, PA-C   metroNIDAZOLE (FLAGYL) 500 MG tablet Take 1 tablet (500 mg total) by mouth 2 (two) times daily for 7 days. 14 tablet Gareth Morgan      PDMP not reviewed this encounter.   Shirlee Latch, PA-C 05/02/21 1651

## 2021-05-02 NOTE — Discharge Instructions (Signed)
-  You have a yeast infection and a bacterial vaginosis infection.  I have sent antibiotics and antifungals to pharmacy. -Practice good hygiene. -The results of your gonorrhea and Chlamydia test will be back later today or early tomorrow.  We will contact you if any results are positive and recommend treatment. -Use protection with your partner to try to prevent STIs.  Use birth control if not trying to get pregnant.

## 2021-05-02 NOTE — ED Triage Notes (Signed)
Patient reports vaginal discharge, itching and irritation that started yesterday.  Patient does want to be tested for STD.

## 2021-05-26 ENCOUNTER — Ambulatory Visit
Admission: EM | Admit: 2021-05-26 | Discharge: 2021-05-26 | Disposition: A | Discharge status: Home / Self Care | Payer: Medicaid Other | Attending: Physician Assistant | Admitting: Physician Assistant

## 2021-05-26 ENCOUNTER — Other Ambulatory Visit: Payer: Self-pay

## 2021-05-26 ENCOUNTER — Encounter: Payer: Self-pay | Admitting: Licensed Clinical Social Worker

## 2021-05-26 DIAGNOSIS — N898 Other specified noninflammatory disorders of vagina: Secondary | ICD-10-CM

## 2021-05-26 DIAGNOSIS — N760 Acute vaginitis: Secondary | ICD-10-CM

## 2021-05-26 LAB — WET PREP, GENITAL
Sperm: NONE SEEN
Trich, Wet Prep: NONE SEEN
WBC, Wet Prep HPF POC: NONE SEEN

## 2021-05-26 MED ORDER — FLUCONAZOLE 150 MG PO TABS: ORAL_TABLET | ORAL | 0 refills | Status: DC

## 2021-05-26 MED ORDER — METRONIDAZOLE 500 MG PO TABS: 500.0000 mg | ORAL_TABLET | Freq: Two times a day (BID) | ORAL | 0 refills | Status: AC

## 2021-05-26 MED ORDER — CLOTRIMAZOLE 1 % EX CREA: TOPICAL_CREAM | CUTANEOUS | 0 refills | Status: DC

## 2021-05-26 NOTE — Discharge Instructions (Signed)
-  You have tested positive for yeast and BV again.  I have sent Diflucan, clotrimazole cream and metronidazole to pharmacy. -As we discussed, use pH balanced washes and wipes make sure to clean up really well after intercourse. -Also consider taking a daily probiotic for feminine health. -If for some reason you continue to have symptoms after you finish these medications, please call back and we can send in clindamycin. -Also I would consider seeing gynecology if you have recurrent BV and yeast.

## 2021-05-26 NOTE — ED Triage Notes (Signed)
Pt c/o of grayish vaginal discharge, itching, sxs x 4 weeks. Pt states prescription from last visit did not work.

## 2021-05-26 NOTE — ED Provider Notes (Signed)
[facility]    CSN: [id number] Arrival date & time: 05/26/21  0947      History   Chief Complaint Chief Complaint  Patient presents with   Vaginal Discharge   Vaginal Itching    HPI Sarah [name] is a 40 y.o. female presenting for nearly 1 month history of grayish vaginal discharge and vaginal itching.  Patient seen in [place] urgent [facility] 3 weeks ago by me when she tested positive for clue cells and yeast and was prescribed metronidazole and Diflucan.  Patient took both medications and says she was feeling better but then she started her menstrual.  And symptoms seem to come back and worsen after the menstrual period.  She had negative STI testing.  Patient reports history of recurrent BV infections in the past.  No recent or severe worsening of symptoms.  No other complaints.  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    There are no problems to display for this patient.   Past Surgical History:  Procedure Laterality Date   DILATION AND EVACUATION  05/17/2007   TOOTH EXTRACTION Bilateral 03/25/2017   Procedure: DENTAL RESTORATION/EXTRACTIONS X8;  Surgeon: Ocie Doyne, DDS;  Location: [facility];  Service: Oral Surgery;  Laterality: Bilateral;   WISDOM TOOTH EXTRACTION      OB History     Gravida  11   Para  9   Term  9   Preterm      AB  2   Living  11      SAB  2   IAB      Ectopic      Multiple  2   Live Births  11            Home Medications    Prior to Admission medications   Medication Sig Start Date End Date Taking? Authorizing Provider  clotrimazole (LOTRIMIN) 1 % cream Apply to affected area 2 times daily 05/26/21  Yes Eusebio Friendly B, PA-C  metroNIDAZOLE (FLAGYL) 500 MG tablet Take 1 tablet (500 mg total) by mouth 2 (two) times daily for 7 days. 05/26/21 06/02/21 Yes Shirlee Latch, PA-C  fluconazole (DIFLUCAN)  150 MG tablet Take 1 tab PO q72 hr prn yeast infection 05/26/21   Shirlee Latch, PA-C    Family History Family History  Adopted: Yes    Social History Social History   Tobacco Use   Smoking status: Never   Smokeless tobacco: Never  Vaping Use   Vaping Use: Never used  Substance Use Topics   Alcohol use: No   Drug use: No     Allergies   Latex and Mushroom extract complex   Review of Systems Review of Systems  Constitutional:  Negative for fever.  Gastrointestinal:  Negative for abdominal pain, nausea and vomiting.  Genitourinary:  Positive for vaginal discharge. Negative for dysuria, flank pain, frequency, hematuria, urgency, vaginal bleeding and vaginal pain.       +vaginal itching  Musculoskeletal:  Negative for back pain.  Skin:  Negative for rash.    Physical Exam Triage Vital Signs ED Triage Vitals [05/26/21 1152]  Enc Vitals Group     BP      Pulse      Resp      Temp      Temp src      SpO2  Weight 255 lb (115.7 kg)     Height 5\' 4"  (1.626 m)     Head Circumference      Peak Flow      Pain Score 0     Pain Loc      Pain Edu?      Excl. in GC?    No data found.  Updated Vital Signs BP (!) 125/92 (BP Location: Left Arm)   Pulse 61   Temp 100 F (37.8 C)   Resp 16   Ht 5\' 4"  (1.626 m)   Wt 255 lb (115.7 kg)   SpO2 100%   BMI 43.77 kg/m      Physical Exam Vitals and nursing note reviewed.  Constitutional:      General: She is not in acute distress.    Appearance: Normal appearance. She is not ill-appearing or toxic-appearing.  HENT:     Head: Normocephalic and atraumatic.  Eyes:     General: No scleral icterus.       Right eye: No discharge.        Left eye: No discharge.     Conjunctiva/sclera: Conjunctivae normal.  Cardiovascular:     Rate and Rhythm: Normal rate and regular rhythm.     Heart sounds: Normal heart sounds.  Pulmonary:     Effort: Pulmonary effort is normal. No respiratory distress.     Breath sounds:  Normal breath sounds.  Abdominal:     Palpations: Abdomen is soft.     Tenderness: There is no abdominal tenderness.  Musculoskeletal:     Cervical back: Neck supple.  Skin:    General: Skin is dry.  Neurological:     General: No focal deficit present.     Mental Status: She is alert. Mental status is at baseline.     Motor: No weakness.     Gait: Gait normal.  Psychiatric:        Mood and Affect: Mood normal.        Behavior: Behavior normal.        Thought Content: Thought content normal.     UC Treatments / Results  Labs (all labs ordered are listed, but only abnormal results are displayed) Labs Reviewed  WET PREP, GENITAL - Abnormal; Notable for the following components:      Result Value   Yeast Wet Prep HPF POC PRESENT (*)    Clue Cells Wet Prep HPF POC PRESENT (*)    All other components within normal limits    EKG   Radiology No results found.  Procedures Procedures (including critical care time)  Medications Ordered in UC Medications - No data to display  Initial Impression / Assessment and Plan / UC Course  I have reviewed the triage vital signs and the nursing notes.  Pertinent labs & imaging results that were available during my care of the patient were reviewed by me and considered in my medical decision making (see chart for details).  40 year old female returning for vaginal discharge.  Patient seen 3 weeks ago and treated with metronidazole 500 mg twice daily x7 days and Diflucan 150 mg x2 doses.  Today she is positive for yeast and clue cells again.  I discussed treatment options with patient including metronidazole gel suppository.  Patient does not want to do this.  We also discussed oral medication options which include trying another round of metronidazole oral versus clindamycin.  Advised her metronidazole is that worked better.  She agrees to do another round of  metronidazole.  Advised her that if her vaginal discharge comes back or does not go  away with this medication then she can give our office a call back and we can send in clindamycin for her.  I have also sent in more Diflucan and clotrimazole topical.  Reviewed using pH balance washes and feminine wipes and starting a probiotic.  Follow-up as needed with [place], but advised her she may need to see gynecologist if she is having recurrent infections.   Final Clinical Impressions(s) / UC Diagnoses   Final diagnoses:  Acute vaginitis  Vaginal discharge     Discharge Instructions      -You have tested positive for yeast and BV again.  I have sent Diflucan, clotrimazole cream and metronidazole to pharmacy. -As we discussed, use pH balanced washes and wipes make sure to clean up really well after intercourse. -Also consider taking a daily probiotic for feminine health. -If for some reason you continue to have symptoms after you finish these medications, please call back and we can send in clindamycin. -Also I would consider seeing gynecology if you have recurrent BV and yeast.     ED Prescriptions     Medication Sig Dispense Auth. Provider   fluconazole (DIFLUCAN) 150 MG tablet Take 1 tab PO q72 hr prn yeast infection 3 tablet Eusebio Friendly B, PA-C   clotrimazole (LOTRIMIN) 1 % cream Apply to affected area 2 times daily 15 g Michiel Cowboy, Liliani Bobo B, PA-C   metroNIDAZOLE (FLAGYL) 500 MG tablet Take 1 tablet (500 mg total) by mouth 2 (two) times daily for 7 days. 14 tablet Gareth Morgan      PDMP not reviewed this encounter.   Shirlee Latch, PA-C 05/26/21 1333

## 2021-07-10 ENCOUNTER — Emergency Department
Admission: EM | Admit: 2021-07-10 | Discharge: 2021-07-10 | Disposition: A | Discharge status: Home / Self Care | Payer: Medicaid Other | Attending: Emergency Medicine | Admitting: Emergency Medicine

## 2021-07-10 ENCOUNTER — Emergency Department: Payer: Medicaid Other

## 2021-07-10 ENCOUNTER — Encounter: Payer: Self-pay | Admitting: Emergency Medicine

## 2021-07-10 ENCOUNTER — Other Ambulatory Visit: Payer: Self-pay

## 2021-07-10 DIAGNOSIS — R102 Pelvic and perineal pain: Secondary | ICD-10-CM

## 2021-07-10 DIAGNOSIS — N911 Secondary amenorrhea: Secondary | ICD-10-CM | POA: Insufficient documentation

## 2021-07-10 DIAGNOSIS — N9489 Other specified conditions associated with female genital organs and menstrual cycle: Secondary | ICD-10-CM | POA: Insufficient documentation

## 2021-07-10 DIAGNOSIS — Z91040 Latex allergy status: Secondary | ICD-10-CM | POA: Diagnosis not present

## 2021-07-10 DIAGNOSIS — N739 Female pelvic inflammatory disease, unspecified: Secondary | ICD-10-CM | POA: Insufficient documentation

## 2021-07-10 DIAGNOSIS — R1031 Right lower quadrant pain: Secondary | ICD-10-CM | POA: Diagnosis present

## 2021-07-10 DIAGNOSIS — N730 Acute parametritis and pelvic cellulitis: Secondary | ICD-10-CM

## 2021-07-10 LAB — URINALYSIS, ROUTINE W REFLEX MICROSCOPIC
Bilirubin Urine: NEGATIVE
Glucose, UA: NEGATIVE mg/dL
Hgb urine dipstick: NEGATIVE
Ketones, ur: NEGATIVE mg/dL
Leukocytes,Ua: NEGATIVE
Nitrite: NEGATIVE
Protein, ur: NEGATIVE mg/dL
Specific Gravity, Urine: 1.02 (ref 1.005–1.030)
pH: 7 (ref 5.0–8.0)

## 2021-07-10 LAB — COMPREHENSIVE METABOLIC PANEL
ALT: 12 U/L (ref 0–44)
AST: 17 U/L (ref 15–41)
Albumin: 3.7 g/dL (ref 3.5–5.0)
Alkaline Phosphatase: 67 U/L (ref 38–126)
Anion gap: 5 (ref 5–15)
BUN: 9 mg/dL (ref 6–20)
CO2: 26 mmol/L (ref 22–32)
Calcium: 9.1 mg/dL (ref 8.9–10.3)
Chloride: 107 mmol/L (ref 98–111)
Creatinine, Ser: 0.97 mg/dL (ref 0.44–1.00)
GFR, Estimated: >60 mL/min (ref >60)
Glucose, Bld: 119 mg/dL — ABNORMAL HIGH (ref 70–99)
Potassium: 3.9 mmol/L (ref 3.5–5.1)
Sodium: 138 mmol/L (ref 135–145)
Total Bilirubin: 0.5 mg/dL (ref 0.3–1.2)
Total Protein: 7.9 g/dL (ref 6.5–8.1)

## 2021-07-10 LAB — WET PREP, GENITAL
Clue Cells Wet Prep HPF POC: NONE SEEN
Sperm: NONE SEEN
Trich, Wet Prep: NONE SEEN
WBC, Wet Prep HPF POC: >10 — AB (ref <10)
Yeast Wet Prep HPF POC: NONE SEEN

## 2021-07-10 LAB — CBC
HCT: 33.9 % — ABNORMAL LOW (ref 36.0–46.0)
Hemoglobin: 10.5 g/dL — ABNORMAL LOW (ref 12.0–15.0)
MCH: 24.9 pg — ABNORMAL LOW (ref 26.0–34.0)
MCHC: 31 g/dL (ref 30.0–36.0)
MCV: 80.3 fL (ref 80.0–100.0)
Platelets: 254 10*3/uL (ref 150–400)
RBC: 4.22 MIL/uL (ref 3.87–5.11)
RDW: 17.3 % — ABNORMAL HIGH (ref 11.5–15.5)
WBC: 7.4 10*3/uL (ref 4.0–10.5)
nRBC: 0 % (ref 0.0–0.2)

## 2021-07-10 LAB — CHLAMYDIA/NGC RT PCR (ARMC ONLY)
Chlamydia Tr: NOT DETECTED
N gonorrhoeae: NOT DETECTED

## 2021-07-10 LAB — LIPASE, BLOOD: Lipase: 43 U/L (ref 11–51)

## 2021-07-10 LAB — POC URINE PREG, ED: Preg Test, Ur: NEGATIVE

## 2021-07-10 LAB — HCG, QUANTITATIVE, PREGNANCY: hCG, Beta Chain, Quant, S: <1 m[IU]/mL (ref <5)

## 2021-07-10 MED ORDER — METRONIDAZOLE 500 MG PO TABS: 500.0000 mg | ORAL_TABLET | Freq: Two times a day (BID) | ORAL | 0 refills | Status: AC

## 2021-07-10 MED ORDER — FLUCONAZOLE 150 MG PO TABS: 150.0000 mg | ORAL_TABLET | Freq: Every day | ORAL | 0 refills | Status: AC

## 2021-07-10 MED ORDER — CEFTRIAXONE SODIUM 1 G IJ SOLR
500.0000 mg | Freq: Once | INTRAMUSCULAR | Status: AC
  Administered 2021-07-10: 500 mg via INTRAMUSCULAR
  Filled 2021-07-10: qty 10

## 2021-07-10 MED ORDER — DOXYCYCLINE HYCLATE 100 MG PO TABS: 100.0000 mg | ORAL_TABLET | Freq: Two times a day (BID) | ORAL | 0 refills | Status: AC

## 2021-07-10 MED ORDER — FLUCONAZOLE 150 MG PO TABS: 150.0000 mg | ORAL_TABLET | ORAL | 0 refills | Status: AC

## 2021-07-10 MED ORDER — AZITHROMYCIN 500 MG PO TABS
1000.0000 mg | ORAL_TABLET | Freq: Once | ORAL | Status: AC
  Administered 2021-07-10: 1000 mg via ORAL
  Filled 2021-07-10: qty 2

## 2021-07-10 NOTE — ED Triage Notes (Addendum)
Pt comes into the ED via POV c/o abd cramping that wraps around into her back.  Pt denies any problems with urination, denies any N/V/D.  Pt explains the symptoms started last week.  PT states it is horrible cramping, but denies that it feels like menstrual cramps.  Pt in NAD at this time with even and unlabored respirations. Pt states that she is unsure if she is pregnant, with last period being October 30th.

## 2021-07-10 NOTE — Discharge Instructions (Addendum)
You have been treated for a pelvic infection that can cause pelvic pain, back pain, and irregular menses.  You should continue with the prescribed antibiotics until all pills are gone.  You should follow-up with a GYN provider for ongoing or worsening symptoms.  Return to the ED for fever, chills, nausea, or severe pelvic pain.

## 2021-07-10 NOTE — ED Provider Notes (Signed)
[facility] Emergency Department Provider Note ____________________________________________  Time seen: 1708  I have reviewed the triage vital signs and the nursing notes.  HISTORY  Chief Complaint  Abdominal Pain  HPI Sarah [name] is a 40 y.o. female presents to the ED for evaluation of bilateral lower abdominal cramping that she describes wraps around into her back.  She denies any dysuria, hematuria, nausea, vomiting, or diarrhea.  She is noted onset of symptoms last week, and notes a recent unprotected sexual encounter last month.  She would also notes some dark brown spotting when she wiped, and admits that she did not have a period last month as expected.  Patient denies any history of irregular menses.  She denies any fever, chills, sweats, chest pain, or shortness of breath.  She had a colposcopy back in September, is not currently on any birth control pills or IUD.  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   teeth    There are no problems to display for this patient.   Past Surgical History:  Procedure Laterality Date   DILATION AND EVACUATION  05/17/2007   TOOTH EXTRACTION Bilateral 03/25/2017   Procedure: DENTAL RESTORATION/EXTRACTIONS X8;  Surgeon: Diona Browner, DDS;  Location: [facility];  Service: Oral Surgery;  Laterality: Bilateral;   WISDOM TOOTH EXTRACTION      Prior to Admission medications   Medication Sig Start Date End Date Taking? Authorizing Provider  doxycycline (VIBRA-TABS) 100 MG tablet Take 1 tablet (100 mg total) by mouth 2 (two) times daily for 14 days. 07/10/21 07/24/21 Yes Gerilyn Stargell, Dannielle Karvonen, PA-C  metroNIDAZOLE (FLAGYL) 500 MG tablet Take 1 tablet (500 mg total) by mouth 2 (two) times daily for 14 days. 07/10/21 07/24/21 Yes Raymont Andreoni, Dannielle Karvonen, PA-C  clotrimazole (LOTRIMIN) 1 % cream Apply to affected area 2 times daily  05/26/21   Danton Clap, PA-C  fluconazole (DIFLUCAN) 150 MG tablet Take 1 tab PO q72 hr prn yeast infection 05/26/21   Danton Clap, PA-C    Allergies Latex and Mushroom extract complex  Family History  Adopted: Yes    Social History Social History   Tobacco Use   Smoking status: Never   Smokeless tobacco: Never  Vaping Use   Vaping Use: Never used  Substance Use Topics   Alcohol use: No   Drug use: No    Review of Systems  Constitutional: Negative for fever. Cardiovascular: Negative for chest pain. Respiratory: Negative for shortness of breath. Gastrointestinal: Negative for abdominal pain, vomiting and diarrhea. Genitourinary: Negative for dysuria. Reports pelvic pain and intermittent discharge Musculoskeletal: Negative for back pain. Skin: Negative for rash. Neurological: Negative for headaches, focal weakness or numbness. ____________________________________________  PHYSICAL EXAM:  VITAL SIGNS: ED Triage Vitals  Enc Vitals Group     BP 07/10/21 1721 (!) 183/87     Pulse Rate 07/10/21 1721 70     Resp 07/10/21 1721 20     Temp 07/10/21 1721 99.5 F (37.5 C)     Temp Source 07/10/21 1721 Oral     SpO2 07/10/21 1721 100 %     Weight 07/10/21 1451 255 lb 1.2 oz (115.7 kg)     Height 07/10/21 1451 5\' 4"  (1.626 m)     Head Circumference --      Peak Flow --      Pain Score 07/10/21 1451 9  Pain Loc --      Pain Edu? --      Excl. in [place]? --     Constitutional: Alert and oriented. Well appearing and in no distress. Head: Normocephalic and atraumatic. Cardiovascular: Normal rate, regular rhythm. Normal distal pulses. Respiratory: Normal respiratory effort. No wheezes/rales/rhonchi. Gastrointestinal: Soft and nontender. No distention. GU: normal external genitalia. Thin, frothy discharge in the vault. CMT noted with placement of speculum and with bimanual exam. No adnexal masses appreciated.  Musculoskeletal: Nontender with normal range of motion  in all extremities.  Neurologic:  Normal gait without ataxia. Normal speech and language. No gross focal neurologic deficits are appreciated. Skin:  Skin is warm, dry and intact. No rash noted. Psychiatric: Mood and affect are normal. Patient exhibits appropriate insight and judgment. ____________________________________________    {LABS (pertinent positives/negatives)  Labs Reviewed  WET PREP, GENITAL - Abnormal; Notable for the following components:      Result Value   WBC, Wet Prep HPF POC >10 (*)    All other components within normal limits  COMPREHENSIVE METABOLIC PANEL - Abnormal; Notable for the following components:   Glucose, Bld 119 (*)    All other components within normal limits  CBC - Abnormal; Notable for the following components:   Hemoglobin 10.5 (*)    HCT 33.9 (*)    MCH 24.9 (*)    RDW 17.3 (*)    All other components within normal limits  CHLAMYDIA/NGC RT PCR ([facility] ONLY)            LIPASE, BLOOD  URINALYSIS, ROUTINE W REFLEX MICROSCOPIC  HCG, QUANTITATIVE, PREGNANCY  POC URINE PREG, ED  ____________________________________________  {EKG  ____________________________________________   RADIOLOGY Official radiology report(s): US PELVIC COMPLETE WITH TRANSVAGINAL  Result Date: 07/10/2021 CLINICAL DATA:  Initial evaluation for pelvic pain, amenorrhea for 1 week. EXAM: TRANSABDOMINAL AND TRANSVAGINAL ULTRASOUND OF PELVIS TECHNIQUE: Both transabdominal and transvaginal ultrasound examinations of the pelvis were performed. Transabdominal technique was performed for global imaging of the pelvis including uterus, ovaries, adnexal regions, and pelvic cul-de-sac. It was necessary to proceed with endovaginal exam following the transabdominal exam to visualize the uterus, endometrium, and ovaries. COMPARISON:  None available. FINDINGS: Uterus Measurements: 9.6 x 4.8 x 5.1 cm = volume: 123.0 mL. Uterus is anteverted. No discrete fibroid or other mass. Endometrium Thickness:  13.7 mm.  No focal abnormality visualized. Right ovary Measurements: 2.9 x 2.3 x 2.1 cm = volume: 7.0 mL. Normal appearance/no adnexal mass. Left ovary Not visualized.  No adnexal mass. Other findings No abnormal free fluid. IMPRESSION: 1. Negative pelvic ultrasound.  No acute abnormality identified. 2. Nonvisualization of the left ovary. 3. Otherwise normal sonographic appearance of the uterus, endometrium, and right ovary. No pelvic or adnexal mass or abnormal free fluid. Electronically Signed   By: Jeannine Boga M.D.   On: 07/10/2021 19:38   ____________________________________________  PROCEDURES  Rocephin 500 mg IM Azithromycin 1 g PO  Procedures ____________________________________________   INITIAL IMPRESSION / ASSESSMENT AND PLAN / ED COURSE  As part of my medical decision making, I reviewed the following data within the [place] reviewed WNL, Radiograph reviewed NAD, and Notes from prior ED visits   DDX: PID, cervicitis, vaginitis, UTI, pyelonephritis, tubo-ovarian abscess  Your exam is with ED evaluation of 1 week complaint of lower abdominal pain, cramping, and fullness.  She presented to the ED without associated GI symptoms, noting a missed menstrual period.  She was evaluated for complaints, found have a  reassuring lab work-up without abnormality or leukocytosis.  No evidence of UTI pyelonephritis.  Exam did reveal some vaginal discharge and significant cervical motion tenderness.  Patient was further evaluated with ultrasound which did not reveal any acute tubo-ovarian abscess or other pelvic abnormalities patient treated empirically for PID given her history of a recent objective sexual encounter.  Patient will continue with prescriptions for metronidazole and doxycycline.  She is encouraged to follow with primary provider or GYN provider for ongoing evaluation.  Strict return precautions have been reviewed.  Sarah [name] was evaluated in  Emergency Department on 07/10/2021 for the symptoms described in the history of present illness. She was evaluated in the context of the global COVID-19 pandemic, which necessitated consideration that the patient might be at risk for infection with the SARS-CoV-2 virus that causes COVID-19. Institutional protocols and algorithms that pertain to the evaluation of patients at risk for COVID-19 are in a state of rapid change based on information released by regulatory bodies including the CDC and federal and state organizations. These policies and algorithms were followed during the patient's care in the ED. ____________________________________________  FINAL CLINICAL IMPRESSION(S) / ED DIAGNOSES  Final diagnoses:  PID (acute pelvic inflammatory disease)  Amenorrhea, secondary  Pelvic pain      Karmen Stabs, Charlesetta Ivory, PA-C 07/10/21 1946    Phineas Semen, MD 07/10/21 [phone]

## 2021-07-31 ENCOUNTER — Other Ambulatory Visit: Payer: Self-pay

## 2021-07-31 ENCOUNTER — Ambulatory Visit
Admission: EM | Admit: 2021-07-31 | Discharge: 2021-07-31 | Disposition: A | Discharge status: Home / Self Care | Payer: Medicaid Other | Attending: Emergency Medicine | Admitting: Emergency Medicine

## 2021-07-31 ENCOUNTER — Encounter: Payer: Self-pay | Admitting: Emergency Medicine

## 2021-07-31 DIAGNOSIS — N898 Other specified noninflammatory disorders of vagina: Secondary | ICD-10-CM | POA: Insufficient documentation

## 2021-07-31 LAB — HIV ANTIBODY (ROUTINE TESTING W REFLEX): HIV Screen 4th Generation wRfx: NONREACTIVE

## 2021-07-31 LAB — WET PREP, GENITAL
Clue Cells Wet Prep HPF POC: NONE SEEN
Sperm: NONE SEEN
Trich, Wet Prep: NONE SEEN
WBC, Wet Prep HPF POC: <10 — AB (ref <10)
Yeast Wet Prep HPF POC: NONE SEEN

## 2021-07-31 MED ORDER — METRONIDAZOLE 500 MG PO TABS: 500.0000 mg | ORAL_TABLET | Freq: Two times a day (BID) | ORAL | 0 refills | Status: DC

## 2021-07-31 MED ORDER — FLUCONAZOLE 150 MG PO TABS: 150.0000 mg | ORAL_TABLET | Freq: Every day | ORAL | 0 refills | Status: AC

## 2021-07-31 NOTE — ED Provider Notes (Signed)
[facility]    CSN: [id number] Arrival date & time: 07/31/21  0905      History   Chief Complaint Chief Complaint  Patient presents with   Vaginal Itching    HPI Sarah [name] is a 40 y.o. female.   Patient presents with Sarah [name] to green-colored vaginal discharge with itching and irritation for 4 days.  Has not attempted treatment of symptoms.  Sexually active, 1 partner, no condom use.  Vaginal spotting, hematuria, dysuria, urinary frequency or urgency, lower abdominal pain and pressure flank pain.  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   teeth    There are no problems to display for this patient.   Past Surgical History:  Procedure Laterality Date   DILATION AND EVACUATION  05/17/2007   TOOTH EXTRACTION Bilateral 03/25/2017   Procedure: DENTAL RESTORATION/EXTRACTIONS X8;  Surgeon: Ocie Doyne, DDS;  Location: [facility];  Service: Oral Surgery;  Laterality: Bilateral;   WISDOM TOOTH EXTRACTION      OB History     Gravida  11   Para  9   Term  9   Preterm      AB  2   Living  11      SAB  2   IAB      Ectopic      Multiple  2   Live Births  11            Home Medications    Prior to Admission medications   Medication Sig Start Date End Date Taking? Authorizing Provider  clotrimazole (LOTRIMIN) 1 % cream Apply to affected area 2 times daily Patient not taking: Reported on 07/31/2021 05/26/21   Gareth Morgan    Family History Family History  Adopted: Yes    Social History Social History   Tobacco Use   Smoking status: Never   Smokeless tobacco: Never  Vaping Use   Vaping Use: Never used  Substance Use Topics   Alcohol use: No   Drug use: No     Allergies   Latex and Mushroom extract complex   Review of Systems Review of Systems  Constitutional: Negative.   Genitourinary:  Positive for vaginal  discharge and vaginal pain. Negative for decreased urine volume, difficulty urinating, dyspareunia, dysuria, enuresis, flank pain, frequency, genital sores, hematuria, menstrual problem, pelvic pain, urgency and vaginal bleeding.  Skin: Negative.     Physical Exam Triage Vital Signs ED Triage Vitals  Enc Vitals Group     BP 07/31/21 1008 123/82     Pulse Rate 07/31/21 1008 62     Resp 07/31/21 1008 20     Temp 07/31/21 1008 98.2 F (36.8 C)     Temp Source 07/31/21 1008 Oral     SpO2 07/31/21 1008 100 %     Weight --      Height --      Head Circumference --      Peak Flow --      Pain Score 07/31/21 1004 2     Pain Loc --      Pain Edu? --      Excl. in [place]? --    No data found.  Updated Vital Signs BP 123/82 (BP Location: Left Arm) Comment (BP Location): large cuff   Pulse 62    Temp 98.2 F (36.8 C) (Oral)    Resp  20    LMP 07/19/2021    SpO2 100%   Visual Acuity Right Eye Distance:   Left Eye Distance:   Bilateral Distance:    Right Eye Near:   Left Eye Near:    Bilateral Near:     Physical Exam Constitutional:      Appearance: Normal appearance.  Eyes:     Extraocular Movements: Extraocular movements intact.  Pulmonary:     Effort: Pulmonary effort is normal.  Genitourinary:    Comments: Deferred self collect vaginal swabs Skin:    General: Skin is warm and dry.  Neurological:     Mental Status: She is alert and oriented to person, place, and time. Mental status is at baseline.  Psychiatric:        Mood and Affect: Mood normal.        Behavior: Behavior normal.     UC Treatments / Results  Labs (all labs ordered are listed, but only abnormal results are displayed) Labs Reviewed  WET PREP, GENITAL    EKG   Radiology No results found.  Procedures Procedures (including critical care time)  Medications Ordered in UC Medications - No data to display  Initial Impression / Assessment and Plan / UC Course  I have reviewed the triage vital  signs and the nursing notes.  Pertinent labs & imaging results that were available during my care of the patient were reviewed by me and considered in my medical decision making (see chart for details).  Vaginal discharge  Wet prep negative, due to reoccurrence of symptoms and positive results in the past, will treat for bacterial vaginosis and yeast anyway, metronidazole 7-day course and Diflucan prescribed, remaining STI labs pending, will treat per protocol, advised abstinence until labs result and symptoms have resolved, recommended boric acid suppositories for prophylactic treatment after sexual encounters, urgent care or OB/GYN follow-up as needed Final Clinical Impressions(s) / UC Diagnoses   Final diagnoses:  None   Discharge Instructions   None    ED Prescriptions   None    PDMP not reviewed this encounter.   Hans Eden, NP 07/31/21 1115

## 2021-07-31 NOTE — ED Triage Notes (Signed)
07/28/2021 symptoms started of vaginal irritation.  Patient reports a grayish discharge.  Denies abdominal pain, but reports vaginal pain, itching.

## 2021-07-31 NOTE — Discharge Instructions (Signed)
Your vaginal wet prep was negative for yeast, trichomoniasis and bacterial vaginosis however due to your reoccurring symptoms I will move forward with treatment  Take metronidazole twice a day for the next 7 days, while using this medication please do not drink alcohol as it will make you feel very sick  Take 1 dose of Diflucan today and then you may take the second dose after completion of your antibiotic  Your HIV, syphilis, gonorrhea, chlamydia labs are pending, you will be notified if positive and treatment sent to pharmacy, you will have to return to clinic for treatment of gonorrhea or syphilis  Please do not have sex until all lab work has returned and symptoms have cleared  May follow-up with urgent care as needed  Due to reoccurrence of symptoms you may  attempt use of a boric acid suppository after sexual encounters, this medication is inserted vaginally and will dissolve over the 24-hour period, where pantiliner to keep close clean, this medication can be found at most local [facility]

## 2021-08-01 LAB — RPR: RPR Ser Ql: NONREACTIVE

## 2021-09-26 ENCOUNTER — Ambulatory Visit: Payer: Medicaid Other | Admitting: Obstetrics & Gynecology

## 2021-09-29 ENCOUNTER — Ambulatory Visit: Payer: Medicaid Other | Admitting: Obstetrics & Gynecology

## 2021-09-30 ENCOUNTER — Ambulatory Visit: Payer: Medicaid Other | Admitting: Obstetrics & Gynecology

## 2021-10-23 ENCOUNTER — Ambulatory Visit: Payer: Medicaid Other | Admitting: Obstetrics & Gynecology

## 2021-10-23 ENCOUNTER — Ambulatory Visit: Payer: Medicaid Other | Admitting: Obstetrics and Gynecology

## 2021-10-27 ENCOUNTER — Ambulatory Visit (INDEPENDENT_AMBULATORY_CARE_PROVIDER_SITE_OTHER): Payer: Medicaid Other | Admitting: Family Medicine

## 2021-10-27 ENCOUNTER — Other Ambulatory Visit: Payer: Self-pay

## 2021-10-27 ENCOUNTER — Other Ambulatory Visit (HOSPITAL_COMMUNITY)
Admission: RE | Admit: 2021-10-27 | Discharge: 2021-10-27 | Disposition: A | Discharge status: Home / Self Care | Payer: Medicaid Other | Source: Ambulatory Visit | Attending: Obstetrics & Gynecology | Admitting: Obstetrics & Gynecology

## 2021-10-27 ENCOUNTER — Encounter: Payer: Self-pay | Admitting: Family Medicine

## 2021-10-27 VITALS — Ht 64.0 in | Wt 247.0 lb

## 2021-10-27 DIAGNOSIS — R87619 Unspecified abnormal cytological findings in specimens from cervix uteri: Secondary | ICD-10-CM | POA: Diagnosis not present

## 2021-10-27 DIAGNOSIS — Z113 Encounter for screening for infections with a predominantly sexual mode of transmission: Secondary | ICD-10-CM | POA: Diagnosis not present

## 2021-10-27 NOTE — Progress Notes (Signed)
? ? ?  GYNECOLOGY PREVENTATIVE CARE ENCOUNTER NOTE- PAP follow up ? ?Subjective:  ? Sarah [name] is a 41 y.o. [id number] female here for a routine annual gynecologic exam.  Current complaints: none, desires pregnancy in the next year. .   Denies abnormal vaginal bleeding, discharge, pelvic pain, problems with intercourse or other gynecologic concerns.  ?  ?Gynecologic History ?Patient's last menstrual period was 10/17/2021 (exact date). ?Contraception: none ?Last Pap: August 2022. 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  ? ?Health Maintenance Due  ?Topic Date Due  ? COVID-19 Vaccine (1) Never done  ? INFLUENZA VACCINE  Never done  ? ? ?The following portions of the patient's history were reviewed and updated as appropriate: allergies, current medications, past family history, past medical history, past social history, past surgical history and problem list. ? ?Review of Systems ?Pertinent items are noted in HPI. ?  ?Objective:  ?Ht 5\' 4"  (1.626 m)   Wt 247 lb (112 kg)   LMP 10/17/2021 (Exact Date)   BMI 42.40 kg/m?  ?CONSTITUTIONAL: Well-developed, well-nourished female in no acute distress.  ?HENT:  Normocephalic, atraumatic, External right and left ear normal. Oropharynx is clear and moist ?EYES:  No scleral icterus.  ?NECK: Normal range of motion, supple, no masses.  Normal thyroid.  ?SKIN: Skin is warm and dry. No rash noted. Not diaphoretic. No erythema. No pallor. ?NEUROLOGIC: Alert and oriented to person, place, and time. Normal reflexes, muscle tone coordination. No cranial nerve deficit noted. ?PSYCHIATRIC: Normal mood and affect. Normal behavior. Normal judgment and thought content. ?CARDIOVASCULAR: Normal heart rate noted, regular rhythm. 2+ distal pulses. ?RESPIRATORY: Effort and breath sounds normal, no problems with respiration noted. ?BREASTS: Symmetric in size. No masses, skin changes, nipple drainage, or lymphadenopathy. ?ABDOMEN: Soft,  no distention  noted.  No tenderness, rebound or guarding.  ?PELVIC: Normal appearing external genitalia; normal appearing vaginal mucosa and cervix.  No abnormal discharge noted.  Pap smear obtained.  Normal uterine size, no other palpable masses, no uterine or adnexal tenderness. ?MUSCULOSKELETAL: Normal range of motion.  ? ? ?Assessment and Plan:  ?1) Cervical cancer screening- pap today ? ?2) Contraception counseling: Desires pregnancy. Encouraged prenatal vitamin. Counseled on increased risks of aneuploidy in women > 40 ? ?1. Atypical glandular cells on cervical Pap smear ?- Cytology - PAP ? ?2. Screening examination for venereal disease ?- Cervicovaginal ancillary only ?- HIV antibody (with reflex) ?- RPR ?- Hepatitis C antibody ? ? ?Please refer to After Visit Summary for other counseling recommendations.  ? ?Return for positive home pregnancy test. ? ?10/19/2021, MD, MPH, ABFM ?Attending Physician ?[facility] ? ?

## 2021-10-28 LAB — HIV ANTIBODY (ROUTINE TESTING W REFLEX): HIV Screen 4th Generation wRfx: NONREACTIVE

## 2021-10-28 LAB — HEPATITIS C ANTIBODY: Hep C Virus Ab: NONREACTIVE

## 2021-10-28 LAB — RPR: RPR Ser Ql: NONREACTIVE

## 2021-10-29 LAB — CERVICOVAGINAL ANCILLARY ONLY
Bacterial Vaginitis (gardnerella): NEGATIVE
Candida Glabrata: NEGATIVE
Candida Vaginitis: NEGATIVE
Chlamydia: NEGATIVE
Comment: NEGATIVE
Comment: NEGATIVE
Comment: NEGATIVE
Comment: NEGATIVE
Comment: NEGATIVE
Comment: NORMAL
Neisseria Gonorrhea: NEGATIVE
Trichomonas: NEGATIVE

## 2021-10-30 LAB — CYTOLOGY - PAP
Comment: NEGATIVE
High risk HPV: NEGATIVE

## 2021-10-31 ENCOUNTER — Telehealth: Payer: Self-pay

## 2021-10-31 NOTE — Telephone Encounter (Signed)
Pt aware pap has progressed and needs another colposcopy; tx'd to AM for scheduling. ?

## 2021-10-31 NOTE — Telephone Encounter (Signed)
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  Recommend patient come in for colposcopy.  ?

## 2021-11-25 ENCOUNTER — Other Ambulatory Visit (HOSPITAL_COMMUNITY)
Admission: RE | Admit: 2021-11-25 | Discharge: 2021-11-25 | Disposition: A | Discharge status: Home / Self Care | Payer: Medicaid Other | Source: Ambulatory Visit | Attending: Family Medicine | Admitting: Family Medicine

## 2021-11-25 ENCOUNTER — Ambulatory Visit (INDEPENDENT_AMBULATORY_CARE_PROVIDER_SITE_OTHER): Payer: Medicaid Other | Admitting: Family Medicine

## 2021-11-25 ENCOUNTER — Encounter: Payer: Self-pay | Admitting: Family Medicine

## 2021-11-25 VITALS — BP 122/72 | Ht 64.0 in | Wt 248.0 lb

## 2021-11-25 DIAGNOSIS — R87612 Low grade squamous intraepithelial lesion on cytologic smear of cervix (LGSIL): Secondary | ICD-10-CM | POA: Insufficient documentation

## 2021-11-25 DIAGNOSIS — Z7251 High risk heterosexual behavior: Secondary | ICD-10-CM | POA: Diagnosis not present

## 2021-11-25 NOTE — Progress Notes (Signed)
? ? ?  GYNECOLOGY PROBLEM  VISIT ENCOUNTER NOTE ? ?Subjective:  ? Sarah [name] is a 41 y.o. [id number] female here for a problem GYN visit.  Current complaints: irregular cycles, reports she used to have heavy cycles but in March and April her cycle was only 3 day, used to be 8 days.   She reports multiple episodes of unprotected sex since her last period. Does not "feel pregnant" and already urinated. She cannot provide urine sample.  ? ?Denies abnormal vaginal bleeding, discharge, pelvic pain, problems with intercourse or other gynecologic concerns.  ?  ?Gynecologic History ?Patient's last menstrual period was 11/12/2021 (exact date). ? ?Contraception: none ? ?Health Maintenance Due  ?Topic Date Due  ? COVID-19 Vaccine (1) Never done  ? ? ?The following portions of the patient's history were reviewed and updated as appropriate: allergies, current medications, past family history, past medical history, past social history, past surgical history and problem list. ? ?Review of Systems ?Pertinent items are noted in HPI. ?  ?Objective:  ?BP 122/72   Ht 5\' 4"  (1.626 m)   Wt 248 lb (112.5 kg)   LMP 11/12/2021 (Exact Date)   BMI 42.57 kg/m?  ?Gen: well appearing, NAD ?HEENT: no scleral icterus ?CV: RR ?Lung: Normal WOB ?Ext: warm well perfused ? ?PELVIC: Normal appearing external genitalia; normal appearing vaginal mucosa and cervix.  No abnormal discharge noted.  Normal uterine size, no other palpable masses, no uterine or adnexal tenderness. ? ?GYNECOLOGY OFFICE COLPOSCOPY PROCEDURE NOTE ? ?41 y.o. 24 here for colposcopy for low-grade squamous intraepithelial neoplasia (LGSIL - encompassing HPV,mild dysplasia,CIN I) pap smear on 10/27/21. Discussed role for HPV in cervical dysplasia, need for surveillance. ? ?Patient gave informed written consent, time out was performed.  Placed in lithotomy position. Cervix viewed with speculum and colposcope after application of acetic acid.  ? ?Colposcopy adequate? Yes ?  acetowhite lesion(s) noted at 6 o'clock; corresponding biopsies obtained.  ECC specimen obtained. ?All specimens were labeled and sent to pathology. ? ?Chaperone was present during entire procedure. ? ? ?Assessment and Plan:  ?1. LGSIL on Pap smear of cervix ?- Surgical pathology ?Patient was given post procedure instructions.  Will follow up pathology and manage accordingly; patient will be contacted with results and recommendations.  Routine preventative health maintenance measures emphasized. ? ?2. At risk for pregnancy/Unprotected sex ?Reviewed that patient has LMP 4/12 and has several episodes of unprotected sex ?Reviewed safety of colpo in the setting of possible pregnancy ?If no period in May recommended urine pregnancy test ? ?Please refer to After Visit Summary for other counseling recommendations.  ? ?Return pap/colpo follow up and annually for well woman. ? ?June, MD, MPH, ABFM ?Attending Physician ?[facility] ? ? ?[facility], MD, MPH, ABFM, IBCLC ?Attending Physician ?[facility] ? ? ? ? ? ?

## 2021-11-27 LAB — SURGICAL PATHOLOGY

## 2022-03-08 ENCOUNTER — Ambulatory Visit
Admission: EM | Admit: 2022-03-08 | Discharge: 2022-03-08 | Disposition: A | Discharge status: Home / Self Care | Payer: Medicaid Other | Attending: Emergency Medicine | Admitting: Emergency Medicine

## 2022-03-08 DIAGNOSIS — M545 Low back pain, unspecified: Secondary | ICD-10-CM | POA: Diagnosis present

## 2022-03-08 LAB — URINALYSIS, ROUTINE W REFLEX MICROSCOPIC
Bilirubin Urine: NEGATIVE
Glucose, UA: NEGATIVE mg/dL
Hgb urine dipstick: NEGATIVE
Ketones, ur: NEGATIVE mg/dL
Leukocytes,Ua: NEGATIVE
Nitrite: NEGATIVE
Protein, ur: NEGATIVE mg/dL
Specific Gravity, Urine: 1.025 (ref 1.005–1.030)
pH: 6 (ref 5.0–8.0)

## 2022-03-08 MED ORDER — CYCLOBENZAPRINE HCL 10 MG PO TABS: 10.0000 mg | ORAL_TABLET | Freq: Every day | ORAL | 0 refills | Status: DC

## 2022-03-08 MED ORDER — PREDNISONE 20 MG PO TABS: 40.0000 mg | ORAL_TABLET | Freq: Every day | ORAL | 0 refills | Status: DC

## 2022-03-08 NOTE — Discharge Instructions (Signed)
Your pain is most likely caused by irritation to the muscles or ligaments.   Take prednisone every morning with food for 5 days   May use Flexeril at bedtime as needed for additional measures, be mindful this medication will make you drowsy  While on steroids may use Tylenol 500 to 1000 mg every 6 hours as needed for pain  You may use heating pad in 15 minute intervals as needed for additional comfort, within the first 2-3 days you may find comfort in using ice in 10-15 minutes over affected area  Begin stretching affected area daily for 10 minutes as tolerated to further loosen muscles   When lying down place pillow underneath and between knees for support  Can try sleeping without pillow on firm mattress   Practice good posture: head back, shoulders back, chest forward, pelvis back and weight distributed evenly on both legs  If pain persist after recommended treatment or reoccurs if may be beneficial to follow up with orthopedic specialist for evaluation, this doctor specializes in the bones and can manage your symptoms long-term with options such as but not limited to imaging, medications or physical therapy

## 2022-03-08 NOTE — ED Provider Notes (Signed)
[facility]    CSN: [id number] Arrival date & time: 03/08/22  1139      History   Chief Complaint Chief Complaint  Patient presents with   Back Pain    HPI Sarah [name] is a 41 y.o. female.   Patient presents with left lower back pain and numbness for 3 days.  Pain is described as constant and sharp.  Does not radiate.  Pain is worsened by twisting turning bending.  Endorses that 1 week ago she was helping a friend to move prior to symptoms beginning.  Has attempted use of ibuprofen and Tylenol which were ineffective.  Denies urinary or bowel incontinence.  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   teeth    There are no problems to display for this patient.   Past Surgical History:  Procedure Laterality Date   DILATION AND EVACUATION  05/17/2007   TOOTH EXTRACTION Bilateral 03/25/2017   Procedure: DENTAL RESTORATION/EXTRACTIONS X8;  Surgeon: Ocie Doyne, DDS;  Location: [facility];  Service: Oral Surgery;  Laterality: Bilateral;   WISDOM TOOTH EXTRACTION      OB History     Gravida  11   Para  9   Term  9   Preterm      AB  2   Living  11      SAB  2   IAB      Ectopic      Multiple  2   Live Births  11            Home Medications    Prior to Admission medications   Medication Sig Start Date End Date Taking? Authorizing Provider  clotrimazole (LOTRIMIN) 1 % cream Apply to affected area 2 times daily Patient not taking: Reported on 07/31/2021 05/26/21   Shirlee Latch, PA-C  metroNIDAZOLE (FLAGYL) 500 MG tablet Take 1 tablet (500 mg total) by mouth 2 (two) times daily. Patient not taking: Reported on 10/27/2021 07/31/21   Valinda Hoar, NP    Family History Family History  Adopted: Yes    Social History Social History   Tobacco Use   Smoking status: Never   Smokeless tobacco: Never  Vaping Use   Vaping Use: Never  used  Substance Use Topics   Alcohol use: No   Drug use: No     Allergies   Latex and Mushroom extract complex   Review of Systems Review of Systems  Constitutional: Negative.   Musculoskeletal:  Positive for back pain. Negative for arthralgias, gait problem, joint swelling, myalgias, neck pain and neck stiffness.  Skin: Negative.   Neurological: Negative.      Physical Exam Triage Vital Signs ED Triage Vitals  Enc Vitals Group     BP 03/08/22 1214 (!) 147/84     Pulse Rate 03/08/22 1214 (!) 57     Resp 03/08/22 1214 18     Temp 03/08/22 1214 98.4 F (36.9 C)     Temp Source 03/08/22 1214 Oral     SpO2 03/08/22 1214 100 %     Weight 03/08/22 1212 254 lb (115.2 kg)     Height 03/08/22 1212 5\' 4"  (1.626 m)     Head Circumference --      Peak Flow --      Pain Score 03/08/22 1212 10     Pain Loc --  Pain Edu? --      Excl. in GC? --    No data found.  Updated Vital Signs BP (!) 147/84 (BP Location: Left Arm)   Pulse (!) 57   Temp 98.4 F (36.9 C) (Oral)   Resp 18   Ht 5\' 4"  (1.626 m)   Wt 254 lb (115.2 kg)   LMP 03/04/2022   SpO2 100%   BMI 43.60 kg/m   Visual Acuity Right Eye Distance:   Left Eye Distance:   Bilateral Distance:    Right Eye Near:   Left Eye Near:    Bilateral Near:     Physical Exam Constitutional:      Appearance: Normal appearance.  HENT:     Head: Normocephalic.  Eyes:     Extraocular Movements: Extraocular movements intact.  Pulmonary:     Effort: Pulmonary effort is normal.  Musculoskeletal:     Comments: Tenderness is present to the left lower aspect of the back without ecchymosis, swelling or deformity, negative straight leg test, range of motion intact but pain is elicited with changes in movement  Skin:    General: Skin is warm and dry.  Neurological:     Mental Status: She is alert and oriented to person, place, and time. Mental status is at baseline.  Psychiatric:        Mood and Affect: Mood normal.         Behavior: Behavior normal.      UC Treatments / Results  Labs (all labs ordered are listed, but only abnormal results are displayed) Labs Reviewed  URINALYSIS, ROUTINE W REFLEX MICROSCOPIC    EKG   Radiology No results found.  Procedures Procedures (including critical care time)  Medications Ordered in UC Medications - No data to display  Initial Impression / Assessment and Plan / UC Course  I have reviewed the triage vital signs and the nursing notes.  Pertinent labs & imaging results that were available during my care of the patient were reviewed by me and considered in my medical decision making (see chart for details).  Acute left-sided low back pain without sciatica  Etiology is most likely muscular, discussed with patient, declined injection in office, prescribed prednisone and Flexeril for outpatient management and recommended for additional supportive measures, given walker referral to orthopedics if symptoms persist or worsen Final Clinical Impressions(s) / UC Diagnoses   Final diagnoses:  None   Discharge Instructions   None    ED Prescriptions   None    PDMP not reviewed this encounter.   05/04/2022, NP 03/08/22 1301

## 2022-03-08 NOTE — ED Triage Notes (Signed)
Pt c/o Lower left back pain x3days  Pt states that she helped her friend move on Sunday 03/01/22.   Pt denies any other activity and states she has had this pain before and was told its a muscle spasm.   Pt denies any shooting pain going down her left side.   Pt denies any urinary issues or pain.

## 2022-06-14 ENCOUNTER — Encounter: Payer: Self-pay | Admitting: Emergency Medicine

## 2022-06-14 ENCOUNTER — Ambulatory Visit
Admission: EM | Admit: 2022-06-14 | Discharge: 2022-06-14 | Disposition: A | Discharge status: Home / Self Care | Payer: Medicaid Other | Attending: Family Medicine | Admitting: Family Medicine

## 2022-06-14 DIAGNOSIS — B9689 Other specified bacterial agents as the cause of diseases classified elsewhere: Secondary | ICD-10-CM | POA: Diagnosis present

## 2022-06-14 DIAGNOSIS — N760 Acute vaginitis: Secondary | ICD-10-CM | POA: Insufficient documentation

## 2022-06-14 DIAGNOSIS — B3731 Acute candidiasis of vulva and vagina: Secondary | ICD-10-CM | POA: Insufficient documentation

## 2022-06-14 LAB — URINALYSIS, ROUTINE W REFLEX MICROSCOPIC
Bilirubin Urine: NEGATIVE
Glucose, UA: NEGATIVE mg/dL
Hgb urine dipstick: NEGATIVE
Ketones, ur: NEGATIVE mg/dL
Leukocytes,Ua: NEGATIVE
Nitrite: NEGATIVE
Protein, ur: NEGATIVE mg/dL
Specific Gravity, Urine: 1.02 (ref 1.005–1.030)
pH: 5.5 (ref 5.0–8.0)

## 2022-06-14 LAB — WET PREP, GENITAL
Sperm: NONE SEEN
Trich, Wet Prep: NONE SEEN
WBC, Wet Prep HPF POC: <10 — AB (ref <10)

## 2022-06-14 MED ORDER — METRONIDAZOLE 500 MG PO TABS: 500.0000 mg | ORAL_TABLET | Freq: Two times a day (BID) | ORAL | 0 refills | Status: DC

## 2022-06-14 MED ORDER — FLUCONAZOLE 150 MG PO TABS: 150.0000 mg | ORAL_TABLET | Freq: Once | ORAL | 0 refills | Status: AC

## 2022-06-14 NOTE — ED Provider Notes (Signed)
[facility]    CSN: [id number] Arrival date & time: 06/14/22  1204      History   Chief Complaint Chief Complaint  Patient presents with   Vaginal Itching   Vaginal Discharge    HPI  41 year old female presents for evaluation of the above.  Patient reports that she has had vaginal itching and odor since Friday.  She denies any significant discharge.  She has recently changed soaps.  She is concerned that this may be the culprit.  She is also concerned for the possibility of STD.  No relieving factors.  No abdominal pain.  No other reported symptoms.  No other complaints.   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   teeth    There are no problems to display for this patient.   Past Surgical History:  Procedure Laterality Date   DILATION AND EVACUATION  05/17/2007   TOOTH EXTRACTION Bilateral 03/25/2017   Procedure: DENTAL RESTORATION/EXTRACTIONS X8;  Surgeon: Ocie Doyne, DDS;  Location: [facility];  Service: Oral Surgery;  Laterality: Bilateral;   WISDOM TOOTH EXTRACTION      OB History     Gravida  11   Para  9   Term  9   Preterm      AB  2   Living  11      SAB  2   IAB      Ectopic      Multiple  2   Live Births  11            Home Medications    Prior to Admission medications   Medication Sig Start Date End Date Taking? Authorizing Provider  fluconazole (DIFLUCAN) 150 MG tablet Take 1 tablet (150 mg total) by mouth once for 1 dose. Repeat dose in 72 hours. 06/14/22 06/14/22 Yes Krithika Tome G, DO  metroNIDAZOLE (FLAGYL) 500 MG tablet Take 1 tablet (500 mg total) by mouth 2 (two) times daily. 06/14/22  Yes Tommie Sams, DO    Family History Family History  Adopted: Yes    Social History Social History   Tobacco Use   Smoking status: Never   Smokeless tobacco: Never  Vaping Use   Vaping Use: Never used  Substance Use  Topics   Alcohol use: No   Drug use: No     Allergies   Latex and Mushroom extract complex   Review of Systems Review of Systems Per HPI  Physical Exam Triage Vital Signs ED Triage Vitals  Enc Vitals Group     BP 06/14/22 1245 (!) 144/95     Pulse Rate 06/14/22 1245 (!) 113     Resp 06/14/22 1245 14     Temp 06/14/22 1245 98.9 F (37.2 C)     Temp Source 06/14/22 1245 Oral     SpO2 06/14/22 1245 99 %     Weight 06/14/22 1242 260 lb (117.9 kg)     Height 06/14/22 1242 5\' 4"  (1.626 m)     Head Circumference --      Peak Flow --      Pain Score 06/14/22 1241 0     Pain Loc --      Pain Edu? --      Excl. in GC? --    Updated Vital Signs BP (!) 144/95 (BP Location: Left Arm)   Pulse (!) 113   Temp  98.9 F (37.2 C) (Oral)   Resp 14   Ht 5\' 4"  (1.626 m)   Wt 117.9 kg   LMP 05/24/2022 (Exact Date)   SpO2 99%   BMI 44.63 kg/m   Visual Acuity Right Eye Distance:   Left Eye Distance:   Bilateral Distance:    Right Eye Near:   Left Eye Near:    Bilateral Near:     Physical Exam Vitals and nursing note reviewed.  Constitutional:      General: She is not in acute distress.    Appearance: Normal appearance. She is obese.  HENT:     Head: Normocephalic and atraumatic.  Cardiovascular:     Rate and Rhythm: Normal rate and regular rhythm.  Pulmonary:     Effort: Pulmonary effort is normal.     Breath sounds: Normal breath sounds. No wheezing, rhonchi or rales.  Abdominal:     General: There is no distension.     Palpations: Abdomen is soft.     Tenderness: There is no abdominal tenderness.  Neurological:     Mental Status: She is alert.  Psychiatric:        Mood and Affect: Mood normal.        Behavior: Behavior normal.    UC Treatments / Results  Labs (all labs ordered are listed, but only abnormal results are displayed) Labs Reviewed  WET PREP, GENITAL - Abnormal; Notable for the following components:      Result Value   Yeast Wet Prep HPF POC  PRESENT (*)    Clue Cells Wet Prep HPF POC PRESENT (*)    WBC, Wet Prep HPF POC <10 (*)    All other components within normal limits  URINALYSIS, ROUTINE W REFLEX MICROSCOPIC  CERVICOVAGINAL ANCILLARY ONLY    EKG   Radiology No results found.  Procedures Procedures (including critical care time)  Medications Ordered in UC Medications - No data to display  Initial Impression / Assessment and Plan / UC Course  I have reviewed the triage vital signs and the nursing notes.  Pertinent labs & imaging results that were available during my care of the patient were reviewed by me and considered in my medical decision making (see chart for details).    41 year old female presents with vaginal odor and discharge.  Wet prep with BV and yeast.  Treating with Diflucan and Flagyl.  Urine was clear.  Awaiting STD testing.  Final Clinical Impressions(s) / UC Diagnoses   Final diagnoses:  BV (bacterial vaginosis)  Yeast vaginitis   Discharge Instructions   None    ED Prescriptions     Medication Sig Dispense Auth. Provider   fluconazole (DIFLUCAN) 150 MG tablet Take 1 tablet (150 mg total) by mouth once for 1 dose. Repeat dose in 72 hours. 2 tablet Meilyn Heindl G, DO   metroNIDAZOLE (FLAGYL) 500 MG tablet Take 1 tablet (500 mg total) by mouth 2 (two) times daily. 14 tablet 10-[birth year]-1983, DO      PDMP not reviewed this encounter.   Tommie Sams, Tommie Sams 06/14/22 1343

## 2022-06-14 NOTE — ED Triage Notes (Signed)
Patient reports vaginal discharge and itching that started on Friday.  Patient reports some urinary symptoms.  Patient wants to be tested for STDs.

## 2022-06-15 LAB — CERVICOVAGINAL ANCILLARY ONLY
Chlamydia: NEGATIVE
Comment: NEGATIVE
Comment: NORMAL
Neisseria Gonorrhea: NEGATIVE

## 2023-01-03 ENCOUNTER — Emergency Department
Admission: EM | Admit: 2023-01-03 | Discharge: 2023-01-03 | Disposition: A | Discharge status: Home / Self Care | Payer: Medicaid Other | Attending: Emergency Medicine | Admitting: Emergency Medicine

## 2023-01-03 DIAGNOSIS — N760 Acute vaginitis: Secondary | ICD-10-CM | POA: Diagnosis not present

## 2023-01-03 DIAGNOSIS — B9689 Other specified bacterial agents as the cause of diseases classified elsewhere: Secondary | ICD-10-CM

## 2023-01-03 DIAGNOSIS — N898 Other specified noninflammatory disorders of vagina: Secondary | ICD-10-CM | POA: Diagnosis present

## 2023-01-03 LAB — URINALYSIS, COMPLETE (UACMP) WITH MICROSCOPIC
Bacteria, UA: NONE SEEN
Bilirubin Urine: NEGATIVE
Glucose, UA: NEGATIVE mg/dL
Hgb urine dipstick: NEGATIVE
Ketones, ur: NEGATIVE mg/dL
Leukocytes,Ua: NEGATIVE
Nitrite: NEGATIVE
Protein, ur: NEGATIVE mg/dL
Specific Gravity, Urine: 1.027 (ref 1.005–1.030)
pH: 5 (ref 5.0–8.0)

## 2023-01-03 LAB — WET PREP, GENITAL
Sperm: NONE SEEN
Trich, Wet Prep: NONE SEEN
WBC, Wet Prep HPF POC: <10 (ref <10)
Yeast Wet Prep HPF POC: NONE SEEN

## 2023-01-03 LAB — POC URINE PREG, ED: Preg Test, Ur: NEGATIVE

## 2023-01-03 LAB — CHLAMYDIA/NGC RT PCR (ARMC ONLY)
Chlamydia Tr: NOT DETECTED
N gonorrhoeae: NOT DETECTED

## 2023-01-03 MED ORDER — METRONIDAZOLE 500 MG PO TABS: 500.0000 mg | ORAL_TABLET | Freq: Two times a day (BID) | ORAL | 0 refills | Status: AC

## 2023-01-03 MED ORDER — FLUCONAZOLE 150 MG PO TABS: 150.0000 mg | ORAL_TABLET | Freq: Every day | ORAL | 1 refills | Status: DC

## 2023-01-03 NOTE — ED Provider Notes (Signed)
[facility] Provider Note    Event Date/Time   First MD Initiated Contact with Patient 01/03/23 2035     (approximate)   History   Vaginal Itching   HPI  Sarah [name] is a 42 y.o. female [id number] with PMH of depression who presents for evaluation of vaginal itching, rash and discharge.  Patient denies urinary symptoms like urgency, frequency, burning with urination.  She first noticed the itching and discharge on Monday but hoped it would self resolve.  She did endorse dyspareunia.  She has had 1 consistent sexual partner, but states she would like to be "tested for everything".      Physical Exam   Triage Vital Signs: ED Triage Vitals [01/03/23 1903]  Enc Vitals Group     BP 135/69     Pulse Rate 73     Resp 16     Temp 98.5 F (36.9 C)     Temp Source Oral     SpO2 98 %     Weight 265 lb (120.2 kg)     Height 5\' 4"  (1.626 m)     Head Circumference      Peak Flow      Pain Score 0     Pain Loc      Pain Edu?      Excl. in GC?     Most recent vital signs: Vitals:   01/03/23 1903  BP: 135/69  Pulse: 73  Resp: 16  Temp: 98.5 F (36.9 C)  SpO2: 98%     General: Awake, no distress.  CV:  Good peripheral perfusion.  Resp:  Normal effort.  Abd:  No distention.     ED Results / Procedures / Treatments   Labs (all labs ordered are listed, but only abnormal results are displayed) Labs Reviewed  WET PREP, GENITAL - Abnormal; Notable for the following components:      Result Value   Clue Cells Wet Prep HPF POC PRESENT (*)    All other components within normal limits  URINALYSIS, COMPLETE (UACMP) WITH MICROSCOPIC - Abnormal; Notable for the following components:   Color, Urine YELLOW (*)    APPearance CLEAR (*)    All other components within normal limits  CHLAMYDIA/NGC RT PCR ([facility] ONLY)            RPR  HIV ANTIBODY (ROUTINE TESTING W REFLEX)  POC URINE PREG, ED      PROCEDURES:  Critical Care performed:  No  Procedures   MEDICATIONS ORDERED IN ED: Medications - No data to display   IMPRESSION / MDM / ASSESSMENT AND PLAN / ED COURSE  I reviewed the triage vital signs and the nursing notes.                              Differential diagnosis includes, but is not limited to, yeast infection, BV, gonorrhea, chlamydia, UTI.  Patient's presentation is most consistent with acute complicated illness / injury requiring diagnostic workup.  Patient presented to the ED for evaluation of vaginal itching, discharge, rash.  She stated that she wanted to be tested for everything.  Her urine pregnancy test was negative.  UA was clean.  Wet prep showed evidence of clue cells.  I discussed with patient that results of the HIV, syphilis, gonorrhea and chlamydia tests will take some time to get back.  Given that the wet prep was positive for clue cells and  this likely explained her symptoms I felt comfortable discharging patient before receiving the rest of the results.  Patient was happy to go home and is set up with MyChart so she can check her results.  I also explained that I have flagged these tests and I will keep an eye out for her results, if she tests positive someone will reach out to her with appropriate next steps.  I explained that there are multiple STIs and that we did not test for all of them so it is important to continue to practice safe sex.  We also discussed good feminine hygiene habits.  She states that she often gets a yeast infection when she takes antibiotics so I sent her some Diflucan with the metronidazole.  Patient voiced understanding, all questions were answered, patient stable for discharge.    FINAL CLINICAL IMPRESSION(S) / ED DIAGNOSES   Final diagnoses:  Bacterial vaginosis     Rx / DC Orders   ED Discharge Orders          Ordered    fluconazole (DIFLUCAN) 150 MG tablet  Daily        01/03/23 2226    metroNIDAZOLE (FLAGYL) 500 MG tablet  2 times daily         01/03/23 2226             Note:  This document was prepared using Dragon voice recognition software and may include unintentional dictation errors.   Cameron Ali, PA-C 01/03/23 2326    Merwyn Katos, MD 01/04/23 1540

## 2023-01-03 NOTE — ED Triage Notes (Signed)
Pt from home via pov, reports that for the past week she has been having vaginal itching, rash and discharge.

## 2023-01-04 LAB — RPR: RPR Ser Ql: NONREACTIVE

## 2023-01-05 LAB — HIV ANTIBODY (ROUTINE TESTING W REFLEX): HIV Screen 4th Generation wRfx: NONREACTIVE

## 2023-01-19 ENCOUNTER — Other Ambulatory Visit (HOSPITAL_COMMUNITY)
Admission: RE | Admit: 2023-01-19 | Discharge: 2023-01-19 | Disposition: A | Discharge status: Home / Self Care | Payer: Medicaid Other | Source: Ambulatory Visit | Attending: Obstetrics and Gynecology | Admitting: Obstetrics and Gynecology

## 2023-01-19 ENCOUNTER — Ambulatory Visit (INDEPENDENT_AMBULATORY_CARE_PROVIDER_SITE_OTHER): Payer: Medicaid Other | Admitting: Obstetrics and Gynecology

## 2023-01-19 ENCOUNTER — Encounter: Payer: Self-pay | Admitting: Obstetrics and Gynecology

## 2023-01-19 VITALS — BP 112/80 | Ht 64.0 in | Wt 258.0 lb

## 2023-01-19 DIAGNOSIS — N760 Acute vaginitis: Secondary | ICD-10-CM | POA: Diagnosis not present

## 2023-01-19 DIAGNOSIS — N870 Mild cervical dysplasia: Secondary | ICD-10-CM | POA: Insufficient documentation

## 2023-01-19 DIAGNOSIS — Z1151 Encounter for screening for human papillomavirus (HPV): Secondary | ICD-10-CM

## 2023-01-19 DIAGNOSIS — Z124 Encounter for screening for malignant neoplasm of cervix: Secondary | ICD-10-CM | POA: Insufficient documentation

## 2023-01-19 DIAGNOSIS — B9689 Other specified bacterial agents as the cause of diseases classified elsewhere: Secondary | ICD-10-CM | POA: Diagnosis not present

## 2023-01-19 DIAGNOSIS — N898 Other specified noninflammatory disorders of vagina: Secondary | ICD-10-CM

## 2023-01-19 LAB — POCT WET PREP WITH KOH
Clue Cells Wet Prep HPF POC: POSITIVE
KOH Prep POC: POSITIVE — AB
Trichomonas, UA: NEGATIVE
Yeast Wet Prep HPF POC: NEGATIVE

## 2023-01-19 MED ORDER — CLINDAMYCIN HCL 300 MG PO CAPS: 300.0000 mg | ORAL_CAPSULE | Freq: Two times a day (BID) | ORAL | 0 refills | Status: AC

## 2023-01-19 NOTE — Progress Notes (Signed)
[name], [place]   Chief Complaint  Patient presents with   Vaginal Itching    Irritation, discharge, no odor x 2 weeks     HPI:      [name] is a 42 y.o. [id number] whose LMP was Patient's last menstrual period was 01/07/2023 (exact date)., presents today for vaginal itching, burning, irritation with some vaginal d/c, no odor for 2 wks.  Seen in ED 01/03/23 for same sx. Treated for BV with diflucan and flagyl after pos wet prep for clue cells/neg yeast. Pt states sx never improved. No urin sx. Did change [place] soap scents prior to sx. Neg STD testing 01/03/23. Hx of BV in past. No new partners.  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 Repeat pap due in 1 yr.   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   teeth   Past Surgical History:  Procedure Laterality Date   DILATION AND EVACUATION  05/17/2007   TOOTH EXTRACTION Bilateral 03/25/2017   Procedure: DENTAL RESTORATION/EXTRACTIONS X8;  Surgeon: Ocie Doyne, DDS;  Location: [facility];  Service: Oral Surgery;  Laterality: Bilateral;   WISDOM TOOTH EXTRACTION      Family History  Adopted: Yes    Social History   Socioeconomic History   Marital status: Single    Spouse name: Not on file   Number of children: Not on file   Years of education: Not on file   Highest education level: Not on file  Occupational History   Not on file  Tobacco Use   Smoking status: Never   Smokeless tobacco: Never  Vaping Use   Vaping Use: Never used  Substance and Sexual Activity   Alcohol use: No   Drug use: No   Sexual activity: Yes    Birth control/protection: None  Other Topics Concern   Not on file  Social History Narrative   Not on file   Social Determinants of Health   Financial Resource Strain: Not on file  Food Insecurity: Not on file  Transportation Needs: Not on file  Physical  Activity: Not on file  Stress: Not on file  Social Connections: Not on file  Intimate Partner Violence: Not on file    Outpatient Medications Prior to Visit  Medication Sig Dispense Refill   fluconazole (DIFLUCAN) 150 MG tablet Take 1 tablet (150 mg total) by mouth daily. 1 tablet 1   No facility-administered medications prior to visit.      ROS:  Review of Systems  Constitutional:  Negative for fever.  Gastrointestinal:  Negative for blood in stool, constipation, diarrhea, nausea and vomiting.  Genitourinary:  Positive for vaginal discharge. Negative for dyspareunia, dysuria, flank pain, frequency, hematuria, urgency, vaginal bleeding and vaginal pain.  Musculoskeletal:  Negative for back pain.  Skin:  Negative for rash.   BREAST: No symptoms   OBJECTIVE:   Vitals:  BP 112/80   Ht 5\' 4"  (1.626 m)   Wt 258 lb (117 kg)   LMP 01/07/2023 (Exact Date)   BMI 44.29 kg/m   Physical Exam Vitals reviewed.  Constitutional:      Appearance: She is well-developed.  Pulmonary:     Effort: Pulmonary effort is normal.  Genitourinary:    Pubic Area: No rash.      Labia:  Right: Rash present. No tenderness or lesion.        Left: Rash present. No tenderness or lesion.      Vagina: Vaginal discharge present. No erythema or tenderness.     Cervix: Normal.     Uterus: Normal. Not enlarged and not tender.      Adnexa: Right adnexa normal and left adnexa normal.       Right: No mass or tenderness.         Left: No mass or tenderness.      Musculoskeletal:        General: Normal range of motion.     Cervical back: Normal range of motion.  Skin:    General: Skin is warm and dry.  Neurological:     General: No focal deficit present.     Mental Status: She is alert and oriented to person, place, and time.  Psychiatric:        Mood and Affect: Mood normal.        Behavior: Behavior normal.        Thought Content: Thought content normal.        Judgment: Judgment normal.      Results: Results for orders placed or performed in visit on 01/19/23 (from the past 24 hour(s))  POCT Wet Prep with KOH     Status: Abnormal   Collection Time: 01/19/23  1:21 PM  Result Value Ref Range   Trichomonas, UA Negative    Clue Cells Wet Prep HPF POC pos    Epithelial Wet Prep HPF POC     Yeast Wet Prep HPF POC neg    Bacteria Wet Prep HPF POC     RBC Wet Prep HPF POC     WBC Wet Prep HPF POC     KOH Prep POC Positive (A) Negative     Assessment/Plan: BV (bacterial vaginosis) - Plan: clindamycin (CLEOCIN) 300 MG capsule, POCT Wet Prep with KOH, NuSwab Vaginitis (VG); pos exam and wet prep. Rx clindamycin since just treated with flagyl without relief. Check culture.   Vaginal itching - Plan: NuSwab Vaginitis (VG); pos sx, neg wet prep. Check culture. Change to dove sens skin soap in case chem derm. If neg and sx persist, will treat with lotrisone.   Cervical cancer screening - Plan: Cytology - PAP  Screening for HPV (human papillomavirus) - Plan: Cytology - PAP  Dysplasia of cervix, low grade (CIN 1) - Plan: Cytology - PAP; repeat pap today.    Meds ordered this encounter  Medications   clindamycin (CLEOCIN) 300 MG capsule    Sig: Take 1 capsule (300 mg total) by mouth 2 (two) times daily for 7 days.    Dispense:  14 capsule    Refill:  0      Return if symptoms worsen or fail to improve.  [name], PA-C 01/19/2023 1:25 PM

## 2023-01-21 LAB — CYTOLOGY - PAP
Adequacy: ABSENT
Comment: NEGATIVE
Diagnosis: UNDETERMINED — AB
High risk HPV: NEGATIVE

## 2023-01-22 LAB — NUSWAB VAGINITIS (VG)
Atopobium vaginae: HIGH Score — AB
BVAB 2: HIGH Score — AB
Candida albicans, NAA: NEGATIVE
Candida glabrata, NAA: NEGATIVE
Trich vag by NAA: NEGATIVE

## 2023-08-17 ENCOUNTER — Ambulatory Visit
Admission: EM | Admit: 2023-08-17 | Discharge: 2023-08-17 | Disposition: A | Discharge status: Home / Self Care | Payer: Medicaid Other | Attending: Physician Assistant | Admitting: Physician Assistant

## 2023-08-17 ENCOUNTER — Encounter: Payer: Self-pay | Admitting: Emergency Medicine

## 2023-08-17 DIAGNOSIS — R8290 Unspecified abnormal findings in urine: Secondary | ICD-10-CM | POA: Diagnosis present

## 2023-08-17 DIAGNOSIS — B9689 Other specified bacterial agents as the cause of diseases classified elsewhere: Secondary | ICD-10-CM | POA: Diagnosis present

## 2023-08-17 DIAGNOSIS — T3695XA Adverse effect of unspecified systemic antibiotic, initial encounter: Secondary | ICD-10-CM | POA: Diagnosis present

## 2023-08-17 DIAGNOSIS — Z113 Encounter for screening for infections with a predominantly sexual mode of transmission: Secondary | ICD-10-CM | POA: Diagnosis present

## 2023-08-17 DIAGNOSIS — N760 Acute vaginitis: Secondary | ICD-10-CM | POA: Insufficient documentation

## 2023-08-17 DIAGNOSIS — B379 Candidiasis, unspecified: Secondary | ICD-10-CM | POA: Insufficient documentation

## 2023-08-17 LAB — WET PREP, GENITAL
Sperm: NONE SEEN
Trich, Wet Prep: NONE SEEN
WBC, Wet Prep HPF POC: >=10 — AB (ref <10)
Yeast Wet Prep HPF POC: NONE SEEN

## 2023-08-17 LAB — URINALYSIS, W/ REFLEX TO CULTURE (INFECTION SUSPECTED)
Bilirubin Urine: NEGATIVE
Glucose, UA: NEGATIVE mg/dL
Hgb urine dipstick: NEGATIVE
Ketones, ur: NEGATIVE mg/dL
Nitrite: NEGATIVE
Protein, ur: NEGATIVE mg/dL
Specific Gravity, Urine: 1.02 (ref 1.005–1.030)
pH: 6 (ref 5.0–8.0)

## 2023-08-17 LAB — HIV ANTIBODY (ROUTINE TESTING W REFLEX): HIV Screen 4th Generation wRfx: NONREACTIVE

## 2023-08-17 MED ORDER — FLUCONAZOLE 150 MG PO TABS: 150.0000 mg | ORAL_TABLET | Freq: Once | ORAL | 0 refills | Status: AC

## 2023-08-17 MED ORDER — METRONIDAZOLE 500 MG PO TABS: 500.0000 mg | ORAL_TABLET | Freq: Two times a day (BID) | ORAL | 0 refills | Status: AC

## 2023-08-17 NOTE — ED Provider Notes (Signed)
 [facility]    CSN: [id number] Arrival date & time: 08/17/23  0830      History   Chief Complaint Chief Complaint  Patient presents with   Vaginal Discharge   Abdominal Pain    HPI Sarah [name] is a 43 y.o. female presenting for 3-day history of vaginal discharge, suprapubic pressure and malodorous urine.  Denies vaginal itching or lesions.  No flank pain, hematuria, dysuria, urinary frequency or urgency.  Would also like to be screened for STIs.  States she can never be 100% confident in what her partner is doing.  History of frequent BV.  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    Patient Active Problem List   Diagnosis Date Noted   Dysplasia of cervix, low grade (CIN 1) 01/19/2023   BV (bacterial vaginosis) 01/19/2023    Past Surgical History:  Procedure Laterality Date   DILATION AND EVACUATION  05/17/2007   TOOTH EXTRACTION Bilateral 03/25/2017   Procedure: DENTAL RESTORATION/EXTRACTIONS X8;  Surgeon: Sheryle Hamilton, DDS;  Location: [facility];  Service: Oral Surgery;  Laterality: Bilateral;   WISDOM TOOTH EXTRACTION      OB History     Gravida  11   Para  9   Term  9   Preterm      AB  2   Living  11      SAB  2   IAB      Ectopic      Multiple  2   Live Births  11            Home Medications    Prior to Admission medications   Medication Sig Start Date End Date Taking? Authorizing Provider  fluconazole  (DIFLUCAN ) 150 MG tablet Take 1 tablet (150 mg total) by mouth once for 1 dose. 08/17/23 08/17/23 Yes Arvis Jolan NOVAK, PA-C  metroNIDAZOLE  (FLAGYL ) 500 MG tablet Take 1 tablet (500 mg total) by mouth 2 (two) times daily for 7 days. 08/17/23 08/24/23 Yes Arvis Jolan NOVAK, PA-C    Family History Family History  Adopted: Yes    Social History Social History   Tobacco Use   Smoking status: Never   Smokeless  tobacco: Never  Vaping Use   Vaping status: Never Used  Substance Use Topics   Alcohol use: No   Drug use: No     Allergies   Latex and Mushroom extract complex (do not select)   Review of Systems Review of Systems  Constitutional:  Negative for fatigue and fever.  Gastrointestinal:  Negative for abdominal pain and nausea.  Genitourinary:  Positive for vaginal discharge. Negative for dysuria, flank pain, frequency, hematuria, urgency, vaginal bleeding and vaginal pain.  Musculoskeletal:  Negative for back pain.  Skin:  Negative for rash.     Physical Exam Triage Vital Signs ED Triage Vitals [08/17/23 0905]  Encounter Vitals Group     BP      Systolic BP Percentile      Diastolic BP Percentile      Pulse      Resp      Temp      Temp src      SpO2      Weight      Height      Head Circumference      Peak Flow  Pain Score 0     Pain Loc      Pain Education      Exclude from Growth Chart    No data found.  Updated Vital Signs BP (!) 140/88 (BP Location: Left Arm)   Pulse 67   Temp 98.4 F (36.9 C) (Oral)   Resp 18   LMP 07/28/2023   SpO2 98%    Physical Exam Vitals and nursing note reviewed.  Constitutional:      General: She is not in acute distress.    Appearance: Normal appearance. She is not ill-appearing or toxic-appearing.  HENT:     Head: Normocephalic and atraumatic.  Eyes:     General: No scleral icterus.       Right eye: No discharge.        Left eye: No discharge.     Conjunctiva/sclera: Conjunctivae normal.  Cardiovascular:     Rate and Rhythm: Normal rate and regular rhythm.     Heart sounds: Normal heart sounds.  Pulmonary:     Effort: Pulmonary effort is normal. No respiratory distress.     Breath sounds: Normal breath sounds.  Abdominal:     Palpations: Abdomen is soft.     Tenderness: There is no abdominal tenderness. There is no right CVA tenderness or left CVA tenderness.  Musculoskeletal:     Cervical back: Neck  supple.  Skin:    General: Skin is dry.  Neurological:     General: No focal deficit present.     Mental Status: She is alert. Mental status is at baseline.     Motor: No weakness.     Gait: Gait normal.  Psychiatric:        Mood and Affect: Mood normal.        Behavior: Behavior normal.      UC Treatments / Results  Labs (all labs ordered are listed, but only abnormal results are displayed) Labs Reviewed  WET PREP, GENITAL - Abnormal; Notable for the following components:      Result Value   Clue Cells Wet Prep HPF POC PRESENT (*)    WBC, Wet Prep HPF POC >=10 (*)    All other components within normal limits  URINALYSIS, W/ REFLEX TO CULTURE (INFECTION SUSPECTED) - Abnormal; Notable for the following components:   Leukocytes,Ua TRACE (*)    Bacteria, UA RARE (*)    All other components within normal limits  URINE CULTURE  HIV ANTIBODY (ROUTINE TESTING W REFLEX)  RPR  CERVICOVAGINAL ANCILLARY ONLY    EKG   Radiology No results found.  Procedures Procedures (including critical care time)  Medications Ordered in UC Medications - No data to display  Initial Impression / Assessment and Plan / UC Course  I have reviewed the triage vital signs and the nursing notes.  Pertinent labs & imaging results that were available during my care of the patient were reviewed by me and considered in my medical decision making (see chart for details).   43 year old female with history of recurrent BV presents for vaginal discharge, malodorous urine and STI screening.  Symptoms present for 3 days.  No urinary symptoms other than lower abdominal pressure but denies pain.  Patient is afebrile and overall well-appearing.  No acute distress.  On exam has no abdominal tenderness or CVA tenderness.  Chest is clear.  Urinalysis shows trace leukocytes.  Will send for culture but hold off on treating for UTI unless culture is positive.     Wet Prep  positive for clue cells.  Will treat for  BV infection.  This is a recurrent condition for patient.  Sent metronidazole .  She request Diflucan  due to antibiotic induced yeast infections.  Sent Diflucan .  Pending GC/chlamydia/RPR and HIV testing.  Explained to patient how to access results.  Reviewed safe sex practices.  Reviewed return precautions.   Final Clinical Impressions(s) / UC Diagnoses   Final diagnoses:  Bacterial vaginosis  Abnormal urinalysis  Routine screening for STI (sexually transmitted infection)  Antibiotic-induced yeast infection  Recurrent vaginitis     Discharge Instructions      -Positive for BV.  I sent medicine to pharmacy. - Pending STI screening.  Those results will likely be back tomorrow and we will call you with any positives.  Please see results before we do and have any questions please call us . - Urine will be sent for culture to ensure no urinary tract infection.  Will call you if it is positive which may require you to take more antibiotics.     ED Prescriptions     Medication Sig Dispense Auth. Provider   metroNIDAZOLE  (FLAGYL ) 500 MG tablet Take 1 tablet (500 mg total) by mouth 2 (two) times daily for 7 days. 14 tablet Arvis Huxley B, PA-C   fluconazole  (DIFLUCAN ) 150 MG tablet Take 1 tablet (150 mg total) by mouth once for 1 dose. 1 tablet Arvis Huxley KATHEE DEVONNA      PDMP not reviewed this encounter.   Arvis Huxley KATHEE, PA-C 08/17/23 [phone]

## 2023-08-17 NOTE — ED Triage Notes (Signed)
 Patient c/o vaginal discharge and abdominal pressure x 3 days.

## 2023-08-17 NOTE — Discharge Instructions (Signed)
-  Positive for BV.  I sent medicine to pharmacy. - Pending STI screening.  Those results will likely be back tomorrow and we will call you with any positives.  Please see results before we do and have any questions please call us . - Urine will be sent for culture to ensure no urinary tract infection.  Will call you if it is positive which may require you to take more antibiotics.

## 2023-08-18 LAB — CERVICOVAGINAL ANCILLARY ONLY
Chlamydia: NEGATIVE
Comment: NEGATIVE
Comment: NEGATIVE
Comment: NORMAL
Neisseria Gonorrhea: NEGATIVE
Trichomonas: NEGATIVE

## 2023-08-18 LAB — URINE CULTURE

## 2023-08-18 LAB — RPR: RPR Ser Ql: NONREACTIVE

## 2023-11-01 ENCOUNTER — Other Ambulatory Visit: Payer: Self-pay

## 2023-11-01 ENCOUNTER — Encounter: Payer: Self-pay | Admitting: *Deleted

## 2023-11-01 DIAGNOSIS — W108XXA Fall (on) (from) other stairs and steps, initial encounter: Secondary | ICD-10-CM | POA: Diagnosis not present

## 2023-11-01 DIAGNOSIS — S4992XA Unspecified injury of left shoulder and upper arm, initial encounter: Secondary | ICD-10-CM | POA: Diagnosis present

## 2023-11-01 NOTE — ED Triage Notes (Signed)
 Pt states she fell down steps inside the house tonight..  Pt has left shoulder pain.   Limited rom.  No loc  no neck or back pain.  Pt states her leg gave out and she fell   pt alert.

## 2023-11-02 ENCOUNTER — Emergency Department

## 2023-11-02 ENCOUNTER — Emergency Department
Admission: EM | Admit: 2023-11-02 | Discharge: 2023-11-02 | Disposition: A | Discharge status: Home / Self Care | Attending: Emergency Medicine | Admitting: Emergency Medicine

## 2023-11-02 DIAGNOSIS — W19XXXA Unspecified fall, initial encounter: Secondary | ICD-10-CM

## 2023-11-02 DIAGNOSIS — S4992XA Unspecified injury of left shoulder and upper arm, initial encounter: Secondary | ICD-10-CM

## 2023-11-02 MED ORDER — HYDROCODONE-ACETAMINOPHEN 5-325 MG PO TABS: 2.0000 | ORAL_TABLET | Freq: Three times a day (TID) | ORAL | 0 refills | Status: AC | PRN

## 2023-11-02 MED ORDER — IBUPROFEN 800 MG PO TABS
800.0000 mg | ORAL_TABLET | Freq: Once | ORAL | Status: AC
  Administered 2023-11-02: 800 mg via ORAL
  Filled 2023-11-02: qty 1

## 2023-11-02 MED ORDER — HYDROCODONE-ACETAMINOPHEN 5-325 MG PO TABS
2.0000 | ORAL_TABLET | Freq: Once | ORAL | Status: AC
  Administered 2023-11-02: 2 via ORAL
  Filled 2023-11-02: qty 2

## 2023-11-02 MED ORDER — ONDANSETRON 4 MG PO TBDP: 4.0000 mg | ORAL_TABLET | Freq: Four times a day (QID) | ORAL | 0 refills | Status: AC | PRN

## 2023-11-02 MED ORDER — ONDANSETRON 4 MG PO TBDP
4.0000 mg | ORAL_TABLET | Freq: Once | ORAL | Status: AC
  Administered 2023-11-02: 4 mg via ORAL
  Filled 2023-11-02: qty 1

## 2023-11-02 MED ORDER — IBUPROFEN 800 MG PO TABS: 800.0000 mg | ORAL_TABLET | Freq: Three times a day (TID) | ORAL | 0 refills | Status: AC | PRN

## 2023-11-02 NOTE — ED Provider Notes (Signed)
 [facility] Provider Note    Event Date/Time   First MD Initiated Contact with Patient 11/02/23 [phone]     (approximate)   History   Fall and Shoulder Injury   HPI  Sarah [name] is a 43 y.o. female right-hand-dominant with no significant past medical history who presents to the emergency department left shoulder pain.  Reports she fell down the steps onto her left side.  Denies head injury or loss consciousness.  No neck or back pain, chest or abdominal pain.  Did have some numbness in the left hand that has resolved.  Not on blood thinners.   History provided by patient, husband.    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 Authorizing Provider  HYDROcodone-acetaminophen (NORCO/VICODIN) 5-325 MG tablet Take 2 tablets by mouth every 8 (eight) hours as needed. 11/02/23  Yes Ladarrion Telfair, Layla Maw, DO  ibuprofen (ADVIL) 800 MG tablet Take 1 tablet (800 mg total) by mouth every 8 (eight) hours as needed. 11/02/23  Yes Josephmichael Lisenbee N, DO  ondansetron (ZOFRAN-ODT) 4 MG disintegrating tablet Take 1 tablet (4 mg total) by mouth every 6 (six) hours as needed for nausea or vomiting. 11/02/23  Yes Clova Morlock, Layla Maw, DO    Physical Exam   Triage Vital Signs: ED Triage Vitals  Encounter Vitals Group     BP 11/01/23 2352 (!) 160/80     Systolic BP Percentile --      Diastolic BP Percentile --      Pulse Rate 11/01/23 2352 (!) 59     Resp 11/01/23 2352 20     Temp 11/01/23  2352 98.3 F (36.8 C)     Temp Source 11/01/23 2352 Oral     SpO2 11/01/23 2352 100 %     Weight 11/01/23 2349 240 lb (108.9 kg)     Height 11/01/23 2349 5\' 4"  (1.626 m)     Head Circumference --      Peak Flow --      Pain Score 11/01/23 2349 8     Pain Loc --      Pain Education --      Exclude from Growth Chart --     Most recent vital signs: Vitals:   11/01/23 2352  BP: (!) 160/80  Pulse: (!) 59  Resp: 20  Temp: 98.3 F (36.8 C)  SpO2: 100%     CONSTITUTIONAL: Alert, responds appropriately to questions. Well-appearing; well-nourished; GCS 15 HEAD: Normocephalic; atraumatic EYES: Conjunctivae clear, PERRL, EOMI ENT: normal nose; no rhinorrhea; moist mucous membranes; pharynx without lesions noted; no dental injury; no septal hematoma, no epistaxis; no facial deformity or bony tenderness NECK: Supple, no midline spinal tenderness, step-off or deformity; trachea midline CARD: RRR; S1 and S2 appreciated; no murmurs, no clicks, no rubs, no gallops RESP: Normal chest excursion without splinting or tachypnea; breath sounds clear and equal bilaterally; no wheezes, no rhonchi, no rales; no hypoxia or respiratory distress CHEST:  chest  wall stable, no crepitus or ecchymosis or deformity, nontender to palpation; no flail chest ABD/GI: Non-distended; soft, non-tender, no rebound, no guarding; no ecchymosis or other lesions noted PELVIS:  stable, nontender to palpation BACK:  The back appears normal; no midline spinal tenderness, step-off or deformity EXT: Patient has tenderness to palpation diffusely over the left shoulder without bony deformity, soft tissue swelling or ecchymosis.  Limited range of motion due to pain.  2+ left radial pulse.  Compartments of the left arm are soft.  Normal capillary refill and sensation. SKIN: Normal color for age and race; warm NEURO: No facial asymmetry, normal speech, moving all extremities equally  ED Results / Procedures / Treatments    LABS: (all labs ordered are listed, but only abnormal results are displayed) Labs Reviewed - No data to display   EKG:  EKG Interpretation Date/Time:    Ventricular Rate:    PR Interval:    QRS Duration:    QT Interval:    QTC Calculation:   R Axis:      Text Interpretation:            RADIOLOGY: My personal review and interpretation of imaging: Left shoulder x-ray shows no acute abnormality.  I have personally reviewed all radiology reports. DG Shoulder Left Result Date: 11/02/2023 CLINICAL DATA:  Fall, left shoulder pain EXAM: LEFT SHOULDER - 2+ VIEW COMPARISON:  None Available. FINDINGS: There is no evidence of fracture or dislocation. There is no evidence of arthropathy or other focal bone abnormality. Soft tissues are unremarkable. IMPRESSION: Negative. Electronically Signed   By: Charlett Nose M.D.   On: 11/02/2023 00:29     PROCEDURES:  Critical Care performed: No      Procedures    IMPRESSION / MDM / ASSESSMENT AND PLAN / ED COURSE  I reviewed the triage vital signs and the nursing notes.  Patient here with mechanical fall with left shoulder injury.    DIFFERENTIAL DIAGNOSIS (includes but not limited to):   Contusion, fracture, rotator cuff tear, dislocation  Patient's presentation is most consistent with acute complicated illness / injury requiring diagnostic workup.  PLAN: X-ray of the left shoulder reviewed and interpreted by myself and radiologist and is unremarkable.  Neurovascular intact distally.  Will provide with pain medication.  No other sign of injury on exam.  Will place in a sling for comfort but have advised her to take her arm out of the sling frequently and move her arm to prevent developing adhesive capsulitis.  Will provide with orthopedic follow-up if symptoms not improving with conservative management.   MEDICATIONS GIVEN IN ED: Medications  ibuprofen (ADVIL) tablet 800 mg (800 mg Oral Given 11/02/23 0615)   HYDROcodone-acetaminophen (NORCO/VICODIN) 5-325 MG per tablet 2 tablet (2 tablets Oral Given 11/02/23 0615)  ondansetron (ZOFRAN-ODT) disintegrating tablet 4 mg (4 mg Oral Given 11/02/23 0454)     ED COURSE:  At this time, I do not feel there is any life-threatening condition present. I reviewed all nursing notes, vitals, pertinent previous records.  All lab and urine results, EKGs, imaging ordered have been independently reviewed and interpreted by myself.  I reviewed all available radiology reports from any imaging ordered this visit.  Based on my assessment, I feel the patient is safe to be discharged home without further emergent workup and can continue workup as an outpatient as needed. Discussed all findings, treatment plan as well as usual and customary return precautions.  They verbalize understanding and are comfortable with this plan.  Outpatient follow-up has been provided as needed.  All questions have been answered.    CONSULTS:  none   OUTSIDE RECORDS REVIEWED: Reviewed OB/GYN note on 01/19/2023.       FINAL CLINICAL IMPRESSION(S) / ED DIAGNOSES   Final diagnoses:  Fall, initial encounter  Injury of left shoulder, initial encounter     Rx / DC Orders   ED Discharge Orders          Ordered    ibuprofen (ADVIL) 800 MG tablet  Every 8 hours PRN        11/02/23 0609    HYDROcodone-acetaminophen (NORCO/VICODIN) 5-325 MG tablet  Every 8 hours PRN        11/02/23 0609    ondansetron (ZOFRAN-ODT) 4 MG disintegrating tablet  Every 6 hours PRN        11/02/23 0609             Note:  This document was prepared using Dragon voice recognition software and may include unintentional dictation errors.   Hans Rusher, Layla Maw, DO 11/02/23 [phone]

## 2023-11-02 NOTE — ED Notes (Signed)
 Left shoulder pain post fall at home off steps

## 2023-11-02 NOTE — Discharge Instructions (Addendum)

## 2024-08-02 DIAGNOSIS — Z1231 Encounter for screening mammogram for malignant neoplasm of breast: Secondary | ICD-10-CM

## 2024-09-05 ENCOUNTER — Ambulatory Visit

## 2024-09-21 ENCOUNTER — Ambulatory Visit
